# Patient Record
Sex: Female | Born: 1955 | Race: White | Hispanic: No | Marital: Married | Smoking: Former smoker
Health system: Southern US, Community
[De-identification: ages and names within clinical notes are randomized; demographics above are authoritative.]

## PROBLEM LIST (undated history)

## (undated) DIAGNOSIS — K7290 Hepatic failure, unspecified without coma: Secondary | ICD-10-CM

## (undated) DIAGNOSIS — K7682 Hepatic encephalopathy: Secondary | ICD-10-CM

## (undated) DIAGNOSIS — N289 Disorder of kidney and ureter, unspecified: Secondary | ICD-10-CM

## (undated) DIAGNOSIS — O020 Blighted ovum and nonhydatidiform mole: Secondary | ICD-10-CM

## (undated) DIAGNOSIS — E039 Hypothyroidism, unspecified: Secondary | ICD-10-CM

## (undated) DIAGNOSIS — E079 Disorder of thyroid, unspecified: Secondary | ICD-10-CM

## (undated) DIAGNOSIS — I10 Essential (primary) hypertension: Secondary | ICD-10-CM

## (undated) HISTORY — PX: DILATION AND CURETTAGE OF UTERUS: SHX78

## (undated) HISTORY — PX: THYROID SURGERY: SHX805

## (undated) HISTORY — PX: CHOLECYSTECTOMY: SHX55

## (undated) HISTORY — PX: BREAST BIOPSY: SHX20

## (undated) HISTORY — DX: Hypothyroidism, unspecified: E03.9

## (undated) HISTORY — DX: Hepatic failure, unspecified without coma: K72.90

## (undated) HISTORY — PX: HERNIA REPAIR: SHX51

## (undated) HISTORY — PX: HEMORRHOID SURGERY: SHX153

## (undated) HISTORY — DX: Blighted ovum and nonhydatidiform mole: O02.0

## (undated) HISTORY — DX: Hepatic encephalopathy: K76.82

## (undated) HISTORY — PX: TUBAL LIGATION: SHX77

## (undated) HISTORY — PX: APPENDECTOMY: SHX54

## (undated) HISTORY — PX: V-TACH ABLATION: SHX6186

## (undated) HISTORY — PX: BACK SURGERY: SHX140

## (undated) HISTORY — DX: Disorder of kidney and ureter, unspecified: N28.9

---

## 2000-10-27 ENCOUNTER — Encounter: Admission: RE | Admit: 2000-10-27 | Discharge: 2000-12-14 | Payer: Self-pay | Admitting: Internal Medicine

## 2001-11-12 ENCOUNTER — Other Ambulatory Visit: Admission: RE | Admit: 2001-11-12 | Discharge: 2001-11-12 | Payer: Self-pay | Admitting: Internal Medicine

## 2002-10-11 ENCOUNTER — Encounter: Payer: Self-pay | Admitting: Emergency Medicine

## 2002-10-11 ENCOUNTER — Emergency Department (HOSPITAL_COMMUNITY): Admission: EM | Admit: 2002-10-11 | Discharge: 2002-10-11 | Payer: Self-pay | Admitting: Emergency Medicine

## 2002-10-12 ENCOUNTER — Encounter: Payer: Self-pay | Admitting: Emergency Medicine

## 2002-10-12 ENCOUNTER — Emergency Department (HOSPITAL_COMMUNITY): Admission: RE | Admit: 2002-10-12 | Discharge: 2002-10-12 | Payer: Self-pay | Admitting: Emergency Medicine

## 2003-11-13 ENCOUNTER — Emergency Department (HOSPITAL_COMMUNITY): Admission: EM | Admit: 2003-11-13 | Discharge: 2003-11-13 | Payer: Self-pay | Admitting: Emergency Medicine

## 2004-06-19 ENCOUNTER — Inpatient Hospital Stay (HOSPITAL_COMMUNITY): Admission: RE | Admit: 2004-06-19 | Discharge: 2004-06-23 | Payer: Self-pay | Admitting: Neurosurgery

## 2004-07-03 ENCOUNTER — Encounter: Admission: RE | Admit: 2004-07-03 | Discharge: 2004-08-08 | Payer: Self-pay | Admitting: Neurosurgery

## 2004-07-25 ENCOUNTER — Encounter: Admission: RE | Admit: 2004-07-25 | Discharge: 2004-07-25 | Payer: Self-pay | Admitting: Neurosurgery

## 2004-08-20 ENCOUNTER — Encounter: Admission: RE | Admit: 2004-08-20 | Discharge: 2004-08-20 | Payer: Self-pay | Admitting: Neurosurgery

## 2004-11-19 ENCOUNTER — Encounter: Admission: RE | Admit: 2004-11-19 | Discharge: 2004-11-19 | Payer: Self-pay | Admitting: Neurosurgery

## 2009-01-26 ENCOUNTER — Emergency Department (HOSPITAL_COMMUNITY): Admission: EM | Admit: 2009-01-26 | Discharge: 2009-01-26 | Payer: Self-pay | Admitting: Emergency Medicine

## 2009-06-19 ENCOUNTER — Emergency Department (HOSPITAL_COMMUNITY): Admission: EM | Admit: 2009-06-19 | Discharge: 2009-06-19 | Payer: Self-pay | Admitting: Emergency Medicine

## 2010-08-18 LAB — POCT I-STAT, CHEM 8
BUN: 4 mg/dL — ABNORMAL LOW (ref 6–23)
Creatinine, Ser: <0.2 mg/dL — ABNORMAL LOW (ref 0.4–1.2)
Glucose, Bld: 278 mg/dL — ABNORMAL HIGH (ref 70–99)
Potassium: 3.9 mEq/L (ref 3.5–5.1)
Sodium: 134 mEq/L — ABNORMAL LOW (ref 135–145)

## 2010-08-18 LAB — DIFFERENTIAL
Basophils Relative: 1 % (ref 0–1)
Lymphs Abs: 1.4 10*3/uL (ref 0.7–4.0)
Monocytes Absolute: 0.2 10*3/uL (ref 0.1–1.0)
Monocytes Relative: 5 % (ref 3–12)
Neutro Abs: 3.3 10*3/uL (ref 1.7–7.7)
Neutrophils Relative %: 66 % (ref 43–77)

## 2010-08-18 LAB — CBC
Hemoglobin: 14 g/dL (ref 12.0–15.0)
MCHC: 34.8 g/dL (ref 30.0–36.0)
MCV: 98 fL (ref 78.0–100.0)
RBC: 4.11 MIL/uL (ref 3.87–5.11)
WBC: 5.1 10*3/uL (ref 4.0–10.5)

## 2010-08-18 LAB — URINALYSIS, ROUTINE W REFLEX MICROSCOPIC
Bilirubin Urine: NEGATIVE
Ketones, ur: NEGATIVE mg/dL
Nitrite: POSITIVE — AB
Protein, ur: NEGATIVE mg/dL

## 2010-08-18 LAB — URINE MICROSCOPIC-ADD ON

## 2010-08-18 LAB — POCT CARDIAC MARKERS
CKMB, poc: 2 ng/mL (ref 1.0–8.0)
Myoglobin, poc: 141 ng/mL (ref 12–200)
Troponin i, poc: <0.05 ng/mL (ref 0.00–0.09)

## 2010-08-18 LAB — POCT PREGNANCY, URINE: Preg Test, Ur: NEGATIVE

## 2010-09-07 LAB — DIFFERENTIAL
Basophils Relative: 0 % (ref 0–1)
Lymphocytes Relative: 17 % (ref 12–46)
Lymphs Abs: 1.3 10*3/uL (ref 0.7–4.0)
Monocytes Relative: 6 % (ref 3–12)
Neutro Abs: 5.8 10*3/uL (ref 1.7–7.7)
Neutrophils Relative %: 77 % (ref 43–77)

## 2010-09-07 LAB — BASIC METABOLIC PANEL
Calcium: 9.2 mg/dL (ref 8.4–10.5)
Creatinine, Ser: 0.8 mg/dL (ref 0.4–1.2)
GFR calc Af Amer: >60 mL/min (ref >60)
GFR calc non Af Amer: >60 mL/min (ref >60)
Sodium: 138 mEq/L (ref 135–145)

## 2010-09-07 LAB — CBC
Hemoglobin: 13.6 g/dL (ref 12.0–15.0)
MCHC: 33.9 g/dL (ref 30.0–36.0)
RBC: 3.99 MIL/uL (ref 3.87–5.11)
WBC: 7.5 10*3/uL (ref 4.0–10.5)

## 2010-10-18 NOTE — Op Note (Signed)
NAMETAYLEIGH, Traci Harper NO.:  [id number]   MEDICAL RECORD NO.:  [id number]          PATIENT TYPE:  INP   LOCATION:  2899                         FACILITY:  [facility]   PHYSICIAN:  Donalee Citrin, M.D.        DATE OF BIRTH:  08-17-1955   DATE OF PROCEDURE:  06/19/2004  DATE OF DISCHARGE:                                 OPERATIVE REPORT   PREOPERATIVE DIAGNOSIS:  Grade I spondylolisthesis L4-5 with severe lumbar  spinal stenosis L4-5 and lumbar instability L4-5.   PROCEDURE:  Decompressive lumbar laminectomy L4-5 in excess of what would  normally have to be done for a posterior lumbar interbody fusion.  Posterior  lumbar interbody fusion L4-5 using 12 x 26 mm Tangent allograft wedges.  Pedicle screw fixation L4-5 using the M10C Horizon Legacy pedicle screw  system with 6.5 x 45 pedicle screws.  Posterior arthrodesis L4-5.  Open  reduction of spinal deformity L4-5.  Placement of medium Hemovac drain L4-5.   ANESTHESIA:  General endotracheal.   SURGEON:  Donalee Citrin, M.D.   ASSISTANT:  Tia Alert, M.D.   COMPLICATIONS:  CSF leak.   INDICATIONS FOR PROCEDURE:  The patient is a very pleasant 55 year old  female who has longstanding back and bilateral leg pain with neurogenic  claudication over very short distances.  Preoperative imaging showed severe  lumbar spinal stenosis with a virtual complete CSF block by MRI criteria at  L4-5 and about 20% subluxation of L4-5 due to spondylolisthesis.  The  patient had failure of conservative treatment.  The patient was recommended  a decompressive and stabilization procedure.  I discussed all the risks and  benefits of surgery with her.  She understands and agrees to proceed  forward.   DESCRIPTION OF PROCEDURE:  The patient was brought to the operating room,  administered general anesthesia, positioned prone on the Wilson frame, and  the back was prepped and draped in the usual sterile fashion.  Preoperative  x-ray localized  the L4-5 disk space.  After infiltration of 10 mL of  lidocaine with epinephrine, a midline incision was made.  Bovie  electrocautery was used to take down the subcutaneous tissues and  subperiosteal dissection was carried out on the lamina of L3, 4, and 5  bilaterally.  The TP's of L4 and L5 were identified and confirmed by  fluoroscopy.  Then the spinous process of L4 was removed.  Prior to this,  the facet complex, especially on the left, was noted to be markedly  hypertrophic and overgrown on top of the lamina.  This was bitten off with a  Leksell rongeur and the spinous process of L4 was removed and laminectomy  was begun.  The central canal at the L4-5 interspace just to the superior  aspect of the L5 lamina was noted to be markedly stenotic and after the  virtual complete laminectomy had been completed at the L4 cephalad to this  and the L4 neuroforamen had been identified which were noted to be markedly  compressive from overgrowth of facet arthropathy as well as due to the  generous lip, the 3-4 facet was on top of the 4-5 facet compressing the roof  of the L4 nerve root, especially on the right side.  Both of these  neuroforamen were underbitten and opened up.  The complete medial  facetectomies were performed at L4-5.  The L5 nerve root was noted to be  markedly snug due to the compression of the L4-5 disk space and the  overriding lip of the L5 lamina.  The superior aspect of the L5 lamina had  to be removed to decompress this part.  During that central decompression, a  small pinhole rent was placed in the dura, right in the midline.  This was  packed away and the remainder of the laminectomy was completed around it,  decompressing this aspect of the thecal sac which was noted to be markedly  stenotic.  The ligament was noted to be markedly hypertrophied also and the  facet degenerated over top of it.  After all of this had been removed and  the L5 nerve roots were decompressed  at the neuroforamen, this hole was  investigated and it was very small.  It was felt not to be worth sewing due  to the fact that it would probably exacerbate the leak.  So this was packed  away and attention was taken to the interbody work.  The lateral facet  complexes were bitten laterally to expose the lateral interspace.  Epidural  vein was coagulated.  The disk space was incised on the right side with a  D'Errico reflecting the right L5 nerve root medially.  The interspace was  cleaned off.  A size 12 distractor was inserted with good apposition of the  endplates.  Two 12 grafts were then opened.  Then on the left side, the L5  nerve root was then reflected medially.  The disk space was cleaned out  using a size 12 cutter and scissors with fluoroscopy confirming each step  along the way.  The endplates were purchased to the bone graft.  Downgoing  Epstein curet was also used to confirm good preparation of the endplates  both centrally and at the level of bone graft placement.  Then the Tangent  allograft was inserted on the left side, and then on the right side this was  completed in a similar fashion.  Several large fragments of tissue removed  from the central compartment.  Disk was noted to be markedly degenerated and  after placement of the 12 distractor and the left-sided allograft, the  spondylolisthesis was reduced to about half its original slip.  Then the  size 12 cutter and scissors were used to prepare the right side of the  interspace.  Locally harvested autograft was packed against the left side  allograft and the right-sided allograft was inserted.  After both allografts  were inserted, the dural rent was investigated and it had already sealed  itself off.  So this was again just repacked and attention was taken to  pedicle screw placement.  Using a pilot hole and fluoroscopy guiding with  direct intracanalicular inspection of the borders of the medial pedicles as well  as several cephalocaudal borders, pilot holes were drilled.  They were  cannulated with the awl.  The pedicles were noted to be markedly sclerotic  and very difficult to cannulate.  However, this was performed.  The pedicle  was probed from within, tapped with a 5.5 tap, probed again, and a 6.5 x 45  pedicle screw inserted.  The pedicle was noted to be competent from within  the pedicle as well as within the canal and this procedure was repeated at  L5 on the left and L4-5 on the right.  After all four pedicle screws were  inserted, the wound was copiously irrigated.  Meticulous hemostasis was  maintained.  Aggressive decortication was carried out in the lateral gutters  and the TP's and lateral facet complexes.  The remainder of the locally  harvested autograft was packed in these lateral gutters. Then two 40 mm rods  were inserted.  __________ was tightened down at L5.  The L4 pedicle screws  were compressed against L5.  The bone grafts were then reinspected and the  foramen were repalpated and noted to be widely patent.  Then fluoroscopy  confirmed good position of the bone grafts, screws, and rods.  Then the 1 x  1 piece of Duragen which had been placed earlier, had fallen off.  Another  one had to be reapplied and then during drying, a third one had to be  reapplied as this was damaged as well.  But, Tisseel was also placed over  top of the Duragen and Gelfoam and this appeared to have a good sealing of  this pinhole in the dura.  Then after this was packed away and meticulous  hemostasis was maintained, the muscle and fascia were reapproximated with 0  interrupted Vicryl and the subcutaneous tissue closed with 2-0 interrupted  Vicryl and the skin closed with running 4-0 subcuticular.  Benzoin and Steri-  Strips applied.  The patient went to the recovery room in stable condition.  At the end of the case, needle, sponge, and instrument counts correct.       GC/MEDQ  D:  06/19/2004   T:  06/20/2004  Job:  [id number]

## 2011-04-24 ENCOUNTER — Emergency Department (INDEPENDENT_AMBULATORY_CARE_PROVIDER_SITE_OTHER): Payer: 59

## 2011-04-24 ENCOUNTER — Encounter: Payer: Self-pay | Admitting: *Deleted

## 2011-04-24 ENCOUNTER — Emergency Department (HOSPITAL_BASED_OUTPATIENT_CLINIC_OR_DEPARTMENT_OTHER)
Admission: EM | Admit: 2011-04-24 | Discharge: 2011-04-24 | Disposition: A | Discharge status: Other Acute Inpatient Hospital | Payer: 59 | Attending: Emergency Medicine | Admitting: Emergency Medicine

## 2011-04-24 ENCOUNTER — Other Ambulatory Visit: Payer: Self-pay

## 2011-04-24 DIAGNOSIS — R51 Headache: Secondary | ICD-10-CM | POA: Insufficient documentation

## 2011-04-24 DIAGNOSIS — I10 Essential (primary) hypertension: Secondary | ICD-10-CM | POA: Insufficient documentation

## 2011-04-24 DIAGNOSIS — E079 Disorder of thyroid, unspecified: Secondary | ICD-10-CM | POA: Insufficient documentation

## 2011-04-24 DIAGNOSIS — K759 Inflammatory liver disease, unspecified: Secondary | ICD-10-CM | POA: Insufficient documentation

## 2011-04-24 DIAGNOSIS — E119 Type 2 diabetes mellitus without complications: Secondary | ICD-10-CM | POA: Insufficient documentation

## 2011-04-24 DIAGNOSIS — I959 Hypotension, unspecified: Secondary | ICD-10-CM

## 2011-04-24 DIAGNOSIS — R110 Nausea: Secondary | ICD-10-CM

## 2011-04-24 DIAGNOSIS — Z79899 Other long term (current) drug therapy: Secondary | ICD-10-CM | POA: Insufficient documentation

## 2011-04-24 HISTORY — DX: Essential (primary) hypertension: I10

## 2011-04-24 HISTORY — DX: Disorder of thyroid, unspecified: E07.9

## 2011-04-24 LAB — TROPONIN I: Troponin I: <0.3 ng/mL (ref <0.30)

## 2011-04-24 LAB — CBC
HCT: 28 % — ABNORMAL LOW (ref 36.0–46.0)
Hemoglobin: 10.3 g/dL — ABNORMAL LOW (ref 12.0–15.0)
MCH: 37.9 pg — ABNORMAL HIGH (ref 26.0–34.0)
RBC: 2.72 MIL/uL — ABNORMAL LOW (ref 3.87–5.11)

## 2011-04-24 LAB — DIFFERENTIAL
Basophils Relative: 0 % (ref 0–1)
Eosinophils Relative: 1 % (ref 0–5)
Lymphocytes Relative: 38 % (ref 12–46)
Monocytes Absolute: 0.7 10*3/uL (ref 0.1–1.0)
Neutro Abs: 3 10*3/uL (ref 1.7–7.7)
Neutrophils Relative %: 50 % (ref 43–77)

## 2011-04-24 LAB — COMPREHENSIVE METABOLIC PANEL
ALT: 57 U/L — ABNORMAL HIGH (ref 0–35)
BUN: 15 mg/dL (ref 6–23)
CO2: 25 mEq/L (ref 19–32)
Calcium: 9 mg/dL (ref 8.4–10.5)
Creatinine, Ser: 3 mg/dL — ABNORMAL HIGH (ref 0.50–1.10)
GFR calc Af Amer: 19 mL/min — ABNORMAL LOW (ref >90)
GFR calc non Af Amer: 17 mL/min — ABNORMAL LOW (ref >90)
Glucose, Bld: 78 mg/dL (ref 70–99)

## 2011-04-24 LAB — URINALYSIS, ROUTINE W REFLEX MICROSCOPIC
Leukocytes, UA: NEGATIVE
Nitrite: NEGATIVE
Specific Gravity, Urine: 1.012 (ref 1.005–1.030)
Urobilinogen, UA: 2 mg/dL — ABNORMAL HIGH (ref 0.0–1.0)

## 2011-04-24 MED ORDER — POTASSIUM CHLORIDE CRYS ER 20 MEQ PO TBCR
60.0000 meq | EXTENDED_RELEASE_TABLET | Freq: Once | ORAL | Status: AC
  Administered 2011-04-24: 60 meq via ORAL
  Filled 2011-04-24: qty 3

## 2011-04-24 MED ORDER — VANCOMYCIN HCL IN DEXTROSE 1-5 GM/200ML-% IV SOLN
1000.0000 mg | Freq: Once | INTRAVENOUS | Status: AC
  Administered 2011-04-24: 1000 mg via INTRAVENOUS
  Filled 2011-04-24: qty 200

## 2011-04-24 MED ORDER — PIPERACILLIN-TAZOBACTAM 3.375 G IVPB
3.3750 g | Freq: Once | INTRAVENOUS | Status: DC
  Administered 2011-04-24: 3.375 g via INTRAVENOUS
  Filled 2011-04-24: qty 50

## 2011-04-24 MED ORDER — SODIUM CHLORIDE 0.9 % IV BOLUS (SEPSIS)
1000.0000 mL | Freq: Once | INTRAVENOUS | Status: AC
  Administered 2011-04-24: 1000 mL via INTRAVENOUS

## 2011-04-24 NOTE — ED Notes (Signed)
Up to BR via w/c  Tolerated well.  Husband remains at bedside.  Pt status unchanged

## 2011-04-24 NOTE — ED Notes (Signed)
Pt c/o "small" h/a and " low bp" x 1 day

## 2011-04-24 NOTE — ED Provider Notes (Addendum)
History     CSN: [id number] Arrival date & time: 04/24/2011  5:49 PM   First MD Initiated Contact with Patient 04/24/11 1755      Chief Complaint  Patient presents with  . Headache  . Hypotension    (Consider location/radiation/quality/duration/timing/severity/associated sxs/prior treatment) HPI Comments: Patient is a 55 year old female. She woke up this morning feeling her normal usual healthy self. As she was going out the door to visit family for Thanksgiving when she looked into the bright light she developed a sudden intense headache to the bifrontal area. She went inside to lay back down and covered her face with a cough and the headache went away. When she went back out and felt like the headache came back again. At this time she only has very mild throbbing t supraorbital area. She denies any recent headaches other than over the last month has had some similar mild throbbing to the supraorbital areas. She has been seen in her nose and throat doctor for possible sinus issues. She also has had some intermittent bleeding from her nose over the same time.. Today she had some slight dizziness and when she took her blood pressure she noticed that it was low. She is currently on Diovan she had her blood pressure checked about a week ago and was told that it was fine. She has lost a significant amount of weight over the last year and feels that maybe her Diovan his is too strong at this point. She has not noticed any increased fatigue. She denies any chest pain shortness of breath or abdominal pain. No recent nausea vomiting. No recent head injuries. Denies any neuro deficits. She does have a history of increased alcohol intake over the holidays and drink about 6 drinks yesterday. Denies any alcohol intake today.  She also has noted some small cuts recently had taken a long time to stop bleeding. She also says that she was previously on Amaryl for diabetes but since she's lost this weight her blood  sugars have been lower and she stopped taking diabetes medication.  Patient is a 55 y.o. female presenting with headaches.  Headache  This is a new problem. Pertinent negatives include no fever, no shortness of breath, no nausea and no vomiting.    Past Medical History  Diagnosis Date  . Thyroid disease   . Hypertension   . Diabetes mellitus     Past Surgical History  Procedure Date  . Tonsillectomy   . Appendectomy   . Back surgery   . Cesarean section   . Tubal ligation     History reviewed. No pertinent family history.  History  Substance Use Topics  . Smoking status: Former Games developer  . Smokeless tobacco: Not on file  . Alcohol Use: No    OB History    Grav Para Term Preterm Abortions TAB SAB Ect Mult Living                  Review of Systems  Constitutional: Negative for fever, chills, diaphoresis, activity change, appetite change and fatigue.  HENT: Positive for nosebleeds and congestion. Negative for rhinorrhea and sneezing.   Eyes: Negative.   Respiratory: Negative for cough, chest tightness and shortness of breath.   Cardiovascular: Negative for chest pain and leg swelling.  Gastrointestinal: Negative for nausea, vomiting, abdominal pain, diarrhea and blood in stool.  Genitourinary: Negative for frequency, hematuria, flank pain and difficulty urinating.  Musculoskeletal: Negative for back pain and arthralgias.  Skin: Negative for  rash.  Neurological: Positive for dizziness and headaches. Negative for tremors, seizures, syncope, speech difficulty, weakness and numbness.  Hematological: Negative for adenopathy. Bruises/bleeds easily.    Allergies  Review of patient's allergies indicates no known allergies.  Home Medications   Current Outpatient Rx  Name Route Sig Dispense Refill  . ASPIRIN-ACETAMINOPHEN-CAFFEINE 250-250-65 MG PO TABS Oral Take 2 tablets by mouth once as needed. For headache      . DOCUSATE SODIUM 100 MG PO CAPS Oral Take 100 mg by  mouth daily.      Marland Kitchen GLIMEPIRIDE 2 MG PO TABS Oral Take 2 mg by mouth daily.      . IBUPROFEN 200 MG PO TABS Oral Take 400 mg by mouth once as needed. For headache     . SYNTHROID PO Oral Take 2 tablets by mouth daily.      Marland Kitchen METOPROLOL SUCCINATE 100 MG PO TB24 Oral Take 100 mg by mouth 2 (two) times daily.      Marland Kitchen OMEPRAZOLE MAGNESIUM 20 MG PO TBEC Oral Take 20 mg by mouth daily.      Marland Kitchen PHENYLEPHRINE HCL 10 MG PO TABS Oral Take 10 mg by mouth every 4 (four) hours as needed. For headache     . DIOVAN PO Oral Take 1 tablet by mouth daily.        BP 92/40  Pulse 72  Temp(Src) 98.1 F (36.7 C) (Oral)  Resp 16  Ht 5\' 2"  (1.575 m)  Wt 215 lb (97.523 kg)  BMI 39.32 kg/m2  SpO2 100%  Physical Exam  Constitutional: She is oriented to person, place, and time. She appears well-developed and well-nourished.  HENT:  Head: Normocephalic and atraumatic.  Eyes: EOM are normal. Pupils are equal, round, and reactive to light.       Positive icterus present  Neck: Normal range of motion. Neck supple.  Cardiovascular: Normal rate, regular rhythm and normal heart sounds.   Pulmonary/Chest: Effort normal and breath sounds normal. No respiratory distress. She has no wheezes. She has no rales. She exhibits no tenderness.  Abdominal: Soft. Bowel sounds are normal. There is no tenderness. There is no rebound and no guarding.  Genitourinary:       Rectal no gross blood  Musculoskeletal: Normal range of motion. She exhibits no edema.  Lymphadenopathy:    She has no cervical adenopathy.  Neurological: She is alert and oriented to person, place, and time.  Skin: Skin is warm and dry. No rash noted.  Psychiatric: She has a normal mood and affect.    ED Course  Procedures (including critical care time)  Results for orders placed during the hospital encounter of 04/24/11  CBC      Component Value Range   WBC 6.2  4.0 - 10.5 (K/uL)   RBC 2.72 (*) 3.87 - 5.11 (MIL/uL)   Hemoglobin 10.3 (*) 12.0 - 15.0  (g/dL)   HCT 16.1 (*) 09.6 - 46.0 (%)   MCV 102.9 (*) 78.0 - 100.0 (fL)   MCH 37.9 (*) 26.0 - 34.0 (pg)   MCHC 36.8 (*) 30.0 - 36.0 (g/dL)   RDW 04.5  40.9 - 81.1 (%)   Platelets 82 (*) 150 - 400 (K/uL)  DIFFERENTIAL      Component Value Range   Neutrophils Relative 50  43 - 77 (%)   Lymphocytes Relative 38  12 - 46 (%)   Monocytes Relative 11  3 - 12 (%)   Eosinophils Relative 1  0 - 5 (%)  Basophils Relative 0  0 - 1 (%)   Neutro Abs 3.0  1.7 - 7.7 (K/uL)   Lymphs Abs 2.4  0.7 - 4.0 (K/uL)   Monocytes Absolute 0.7  0.1 - 1.0 (K/uL)   Eosinophils Absolute 0.1  0.0 - 0.7 (K/uL)   Basophils Absolute 0.0  0.0 - 0.1 (K/uL)   RBC Morphology TARGET CELLS     Smear Review LARGE PLATELETS PRESENT    COMPREHENSIVE METABOLIC PANEL      Component Value Range   Sodium 134 (*) 135 - 145 (mEq/L)   Potassium 2.7 (*) 3.5 - 5.1 (mEq/L)   Chloride 94 (*) 96 - 112 (mEq/L)   CO2 25  19 - 32 (mEq/L)   Glucose, Bld 78  70 - 99 (mg/dL)   BUN 15  6 - 23 (mg/dL)   Creatinine, Ser 8.46 (*) 0.50 - 1.10 (mg/dL)   Calcium 9.0  8.4 - 96.2 (mg/dL)   Total Protein 7.1  6.0 - 8.3 (g/dL)   Albumin 2.9 (*) 3.5 - 5.2 (g/dL)   AST 952 (*) 0 - 37 (U/L)   ALT 57 (*) 0 - 35 (U/L)   Alkaline Phosphatase 139 (*) 39 - 117 (U/L)   Total Bilirubin 7.3 (*) 0.3 - 1.2 (mg/dL)   GFR calc non Af Amer 17 (*) >90 (mL/min)   GFR calc Af Amer 19 (*) >90 (mL/min)  URINALYSIS, ROUTINE W REFLEX MICROSCOPIC      Component Value Range   Color, Urine AMBER (*) YELLOW    Appearance CLOUDY (*) CLEAR    Specific Gravity, Urine 1.012  1.005 - 1.030    pH 5.5  5.0 - 8.0    Glucose, UA NEGATIVE  NEGATIVE (mg/dL)   Hgb urine dipstick NEGATIVE  NEGATIVE    Bilirubin Urine MODERATE (*) NEGATIVE    Ketones, ur 15 (*) NEGATIVE (mg/dL)   Protein, ur NEGATIVE  NEGATIVE (mg/dL)   Urobilinogen, UA 2.0 (*) 0.0 - 1.0 (mg/dL)   Nitrite NEGATIVE  NEGATIVE    Leukocytes, UA NEGATIVE  NEGATIVE   LACTIC ACID, PLASMA      Component Value  Range   Lactic Acid, Venous 2.8 (*) 0.5 - 2.2 (mmol/L)  OCCULT BLOOD X 1 CARD TO LAB, STOOL      Component Value Range   Fecal Occult Bld POSITIVE    TROPONIN I      Component Value Range   Troponin I <0.30  <0.30 (ng/mL)  APTT      Component Value Range   aPTT 49 (*) 24 - 37 (seconds)  PROTIME-INR      Component Value Range   Prothrombin Time 18.3 (*) 11.6 - 15.2 (seconds)   INR 1.49  0.00 - 1.49    Dg Chest 2 View  04/24/2011  *RADIOLOGY REPORT*  Clinical Data: Headache.  Hypotension.  CHEST - 2 VIEW 04/24/2011:  Comparison: Portable chest x-ray 06/19/2009 and two-view chest x- ray 06/14/2004 [facility].  Findings: Cardiomediastinal silhouette unremarkable and unchanged. Lungs clear.  Bronchovascular markings normal.  No pleural effusions.  Mild degenerative changes involving the thoracic spine. No significant interval change.  IMPRESSION: No acute cardiopulmonary disease.  Stable examination.  Original Report Authenticated By: Arnell Sieving, M.D.   Ct Head Wo Contrast  04/24/2011  *RADIOLOGY REPORT*  Clinical Data: 55 year old female with headache, hypertension, nausea.  CT HEAD WITHOUT CONTRAST  Technique:  Contiguous axial images were obtained from the base of the skull through the vertex without contrast.  Comparison: None.  Findings: Visualized paranasal sinuses and mastoids are clear. Visualized orbit soft tissues are within normal limits.  Visualized scalp soft tissues are within normal limits.  No acute osseous abnormality identified.  Cerebral volume is within normal limits for age.  No midline shift, ventriculomegaly, mass effect, evidence of mass lesion, intracranial hemorrhage or evidence of cortically based acute infarction.  Gray-white matter differentiation is within normal limits throughout the brain.  No suspicious intracranial vascular hyperdensity.  IMPRESSION: Normal noncontrast CT appearance of the brain.  Original Report Authenticated By: Harley Hallmark,  M.D.     Date: 04/24/2011  Rate: 80  Rhythm: normal sinus rhythm  QRS Axis: normal  Intervals: normal  ST/T Wave abnormalities: normal  Conduction Disutrbances:none  Narrative Interpretation:   Old EKG Reviewed: none available, found old, unchanged from prior     1. Hepatitis   2. Hypotension     I rechecked BP in room and was 84 systolic.  Pt getting IV fluid bolus.  Checking labs, CT head  MDM  Tumor vs hematologic disorder vs infection vs medication reaction   PT with low platelets, has been low in past, but lower now, jaundice with elevated liver enzymes.  Sent hepatic panel.  Is still hypotensive with BP 63, layed pt down, BP went up to 96.  Giving 2nd liter of fluids.  Pt in no distress.  No fever or evidence of sepsis.  May be related to hepatitis.  No pain to abdomen to suggest cholecystitis or cholangitis.  Will consult hospitalist for admission.  Pt requests [facility].  Reviewed labs, hgg 14 one year ago.  Platelets 117   Discussed with hospitalist at [facility], Dr. Heron Nay who has accepted pt for transfer.  CRITICAL CARE Performed by: Theoren Palka   Total critical care time: 80  Critical care time was exclusive of separately billable procedures and treating other patients.  Critical care was necessary to treat or prevent imminent or life-threatening deterioration.  Critical care was time spent personally by me on the following activities: development of treatment plan with patient and/or surrogate as well as nursing, discussions with consultants, evaluation of patient's response to treatment, examination of patient, obtaining history from patient or surrogate, ordering and performing treatments and interventions, ordering and review of laboratory studies, ordering and review of radiographic studies, pulse oximetry and re-evaluation of patient's condition.   Rolan Bucco, MD 04/24/11 1610  Rolan Bucco, MD 04/24/11 [id number]  Rolan Bucco, MD 04/24/11 2307

## 2011-04-24 NOTE — ED Notes (Signed)
Dr. Fredderick Phenix notified of critical K+ of 2.7 per lab tech.

## 2011-04-24 NOTE — ED Notes (Signed)
Has been up to BR via w/c x 3 and tolerated well.to CT and back  And tolerated well.  Remains alert and oriented, face flushed and lips dry.  Husband remains at bedside .

## 2011-04-24 NOTE — ED Notes (Signed)
Report given to RN at [facility] [place])

## 2011-04-25 LAB — URINE CULTURE
Colony Count: NO GROWTH
Culture  Setup Time: 201211230030

## 2011-04-25 LAB — HEPATITIS PANEL, ACUTE: HCV Ab: NEGATIVE

## 2011-05-01 LAB — CULTURE, BLOOD (ROUTINE X 2)
Culture  Setup Time: 201211231410
Culture: NO GROWTH

## 2012-07-02 ENCOUNTER — Ambulatory Visit: Payer: 59 | Attending: Gynecology | Admitting: Gynecology

## 2012-07-02 ENCOUNTER — Encounter: Payer: Self-pay | Admitting: Gynecology

## 2012-07-02 VITALS — BP 110/50 | HR 98 | Temp 98.0°F | Ht 63.0 in | Wt 262.5 lb

## 2012-07-02 DIAGNOSIS — R1900 Intra-abdominal and pelvic swelling, mass and lump, unspecified site: Secondary | ICD-10-CM

## 2012-07-02 DIAGNOSIS — N83209 Unspecified ovarian cyst, unspecified side: Secondary | ICD-10-CM

## 2012-07-02 DIAGNOSIS — R1909 Other intra-abdominal and pelvic swelling, mass and lump: Secondary | ICD-10-CM | POA: Insufficient documentation

## 2012-07-02 DIAGNOSIS — K7031 Alcoholic cirrhosis of liver with ascites: Secondary | ICD-10-CM

## 2012-07-02 DIAGNOSIS — K7290 Hepatic failure, unspecified without coma: Secondary | ICD-10-CM

## 2012-07-02 NOTE — Patient Instructions (Signed)
We will schedule a return appointment for 6 months to have an ultrasound obtained.

## 2012-07-02 NOTE — Progress Notes (Signed)
Consult Note: Gyn-Onc   Traci Harper 57 y.o. female  Chief Complaint  Patient presents with  . Pelvic Mass    New patient      HPI: 57 year old white female seen in consultation at the request of the internal medicine service at [facility] hospitals regarding management of ovarian cysts. 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 In the course of a workup she was found to have massive ascites and on ultrasound of the pelvis was found to have a multicystic right ovarian mass with thin septations. The largest mass measured 3.5 cm. A paracentesis was performed and no malignant cells were identified. A CA 125 was also obtained which was in excess of 700 units per mL. The patient has a past history of ovarian cysts initially recognized by the her prior gynecologist Dr. Arnette Schaumann in [place]. I have contacted Dr. Cliffton Asters tells me the cyst in 2008 measured 3 x 2 cm with some small excrescences.  The patient reports no other gynecologic history except for the fact that she had a hydatidiform mole treated by [facility] and what sounds like intramuscular methotrexate at [facility] many years ago. She subsequently had 2 children.  Review of Systems:10 point review of systems is negative as noted above.   Vitals: Blood pressure 110/50, pulse 98, temperature 98 F (36.7 C), temperature source Oral, height 5\' 3"  (1.6 m), weight 262 lb 8 oz (119.069 kg).  Physical Exam: General : The patient is a healthy woman in no acute distress.  HEENT: normocephalic, extraoccular movements normal; neck is supple without thyromegally  Lynphnodes: Supraclavicular and inguinal nodes not enlarged  Abdomen:  Massively distended with ascites. There are no palpable masses or palpable organomegaly. Pelvic:  EGBUS: Normal female  Vagina: Normal, no lesions  Urethra and Bladder: Normal, non-tender  Cervix: Normal Uterus: Difficult to outline due to the patient's abdominal distention and  obesity. The cervix is mobile. I do not feel any masses or nodularity  Bi-manual examination: Non-tender; no adenxal masses or nodularity  Rectal: normal sphincter tone, no masses, no blood  Lower extremities: Moderate edema of both legs or varicosities. Normal range of motion    Assessment/Plan: Ovarian cyst with thin septations. These appeared to be benign and I would recommend we repeated an ultrasound in 6 months. 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  She return in 6 months to have a repeat ultrasound.  No Known Allergies  Past Medical History  Diagnosis Date  . Thyroid disease   . Hypertension   . Diabetes mellitus   . Hepatic encephalopathy   . Kidney insufficiency   . Hypothyroidism   . Molar pregnancy     at age 26    Past Surgical History  Procedure Date  . Hemorrhoid surgery   . Appendectomy   . Back surgery   . Cesarean section     x2  . Tubal ligation   . Thyroid surgery     x2  . Dilation and curettage of uterus     at 18  . V-tach ablation     Current Outpatient Prescriptions  Medication Sig Dispense Refill  . ciprofloxacin (CIPRO) 750 MG tablet Take 750 mg by mouth once a week.      . folic acid (FOLVITE) 1 MG tablet Take 1 mg by mouth daily.      . hydrOXYzine (ATARAX/VISTARIL) 25 MG tablet Take 25  mg by mouth 2 (two) times daily as needed.      . Lactulose 20 GM/30ML SOLN Take 20 mLs by mouth 3 (three) times daily. TID daily with goal of 3-5 bowel movements a day      . Levothyroxine Sodium (SYNTHROID PO) Take 150 mcg by mouth daily.       . midodrine (PROAMATINE) 10 MG tablet Take 10 mg by mouth 3 (three) times daily.      Marland Kitchen octreotide (SANDOSTATIN) 100 MCG/ML SOLN Inject 100 mcg into the skin 3 (three) times daily.      . rifaximin (XIFAXAN) 550 MG TABS Take 550 mg by mouth 2 (two) times daily.      Marland Kitchen senna-docusate (SENOKOT-S) 8.6-50 MG per tablet Take 1 tablet by mouth daily.      Marland Kitchen thiamine  100 MG tablet Take 100 mg by mouth daily.      . vitamin B-12 (CYANOCOBALAMIN) 100 MCG tablet Take 100 mcg by mouth daily.      Marland Kitchen aspirin-acetaminophen-caffeine (EXCEDRIN MIGRAINE) 250-250-65 MG per tablet Take 2 tablets by mouth once as needed. For headache        . docusate sodium (COLACE) 100 MG capsule Take 100 mg by mouth daily.        . phenylephrine (SUDAFED PE) 10 MG TABS Take 10 mg by mouth every 4 (four) hours as needed. For headache         History   Social History  . Marital Status: Single    Spouse Name: N/A    Number of Children: N/A  . Years of Education: N/A   Occupational History  . Not on file.   Social History Main Topics  . Smoking status: Former Smoker    Quit date: 06/02/2002  . Smokeless tobacco: Not on file  . Alcohol Use: No     Comment: not in over a year  . Drug Use: No  . Sexually Active: No   Other Topics Concern  . Not on file   Social History Narrative  . No narrative on file    Family History  Problem Relation Age of Onset  . Diabetes Mother   . Heart disease Father   . Diabetes Father       Jeannette Corpus, MD 07/02/2012, 12:34 PM

## 2012-09-17 HISTORY — PX: LIVER TRANSPLANT: SHX410

## 2012-12-24 ENCOUNTER — Encounter: Payer: Self-pay | Admitting: Gynecology

## 2012-12-24 ENCOUNTER — Ambulatory Visit: Payer: 59 | Attending: Gynecology | Admitting: Gynecology

## 2012-12-24 VITALS — BP 110/68 | HR 84 | Temp 98.0°F | Resp 16 | Ht 63.0 in | Wt 169.3 lb

## 2012-12-24 DIAGNOSIS — E119 Type 2 diabetes mellitus without complications: Secondary | ICD-10-CM | POA: Insufficient documentation

## 2012-12-24 DIAGNOSIS — I10 Essential (primary) hypertension: Secondary | ICD-10-CM | POA: Insufficient documentation

## 2012-12-24 DIAGNOSIS — R971 Elevated cancer antigen 125 [CA 125]: Secondary | ICD-10-CM | POA: Insufficient documentation

## 2012-12-24 DIAGNOSIS — R188 Other ascites: Secondary | ICD-10-CM | POA: Insufficient documentation

## 2012-12-24 DIAGNOSIS — E039 Hypothyroidism, unspecified: Secondary | ICD-10-CM | POA: Insufficient documentation

## 2012-12-24 DIAGNOSIS — Z944 Liver transplant status: Secondary | ICD-10-CM | POA: Insufficient documentation

## 2012-12-24 DIAGNOSIS — Z79899 Other long term (current) drug therapy: Secondary | ICD-10-CM | POA: Insufficient documentation

## 2012-12-24 DIAGNOSIS — N83209 Unspecified ovarian cyst, unspecified side: Secondary | ICD-10-CM

## 2012-12-24 NOTE — Patient Instructions (Signed)
We will repeat an ultrasound in the near future and then we'll repeat another ultrasound in 3-4 months LOC thereafter. Please contact me if you have any increasing pain. Your given a prescription for oxycodone 10 mg every 6 hours when necessary pain.

## 2012-12-24 NOTE — Progress Notes (Signed)
Consult Note: Gyn-Onc   Traci Harper 57 y.o. female  Chief Complaint  Patient presents with  . Ovarian Cyst    Follow up     Assessment: Ovarian cyst which is increased slightly since last examination. Patient is having mild lower nominal pain which may be secondary to this. At this juncture, given her recent liver transplant, I would like to defer any surgical intervention.  Plan: We'll obtain baseline pelvic ultrasound in the near future. She is given a prescription for oxycodone 10 mg every 6 hours when necessary pain  She return to see me in 3-4 months and will have a repeat ultrasound at that time as well for reassessment.  I contacted the patient's liver transplant physician Dr. Leighton Roach at [facility] [phone] and he concurs that deferring any pelvic surgery for long as possible would be advisable. Interval history: Since I last saw the patient she is undergone a liver transplant at [facility]. Apparently all went very smoothly and the patient is discharged after one week. 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 This was managed by embolectomy.  The patient reports that her ascites is much better and her renal function is much improved. She is now being followed by the transplant group at [facility].  She had a repeat abdominal CT scan on 12/17/2012. Pertinent findings included continued ascites and a left ovarian cystic mass with septation and possible nodule measuring 6.8 cm. This is considered to be slightly larger than previously. The patient reports that she has some intermittent lower nominal pain especially on the right.  HPI: 57 year old white female seen in consultation at the request of the internal medicine service at [facility] regarding management of ovarian cysts. 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 In the course of a workup she was found to have massive ascites and on ultrasound of the pelvis was found to  have a multicystic right ovarian mass with thin septations. The largest mass measured 3.5 cm. A paracentesis was performed and no malignant cells were identified. A CA 125 was also obtained which was in excess of 700 units per mL. The patient has a past history of ovarian cysts initially recognized by the her prior gynecologist Dr. Arnette Schaumann in [place]. I have contacted Dr. Cliffton Asters tells me the cyst in 2008 measured 3 x 2 cm with some small excrescences.  The patient reports no other gynecologic history except for the fact that she had a hydatidiform mole treated by [facility] and what sounds like intramuscular methotrexate at [facility] many years ago. She subsequently had 2 children.  Review of Systems:10 point review of systems is negative as noted above.   Vitals: Blood pressure 110/68, pulse 84, temperature 98 F (36.7 C), temperature source Oral, resp. rate 16, height 5\' 3"  (1.6 m), weight 169 lb 4.8 oz (76.794 kg).  Physical Exam: General : The patient is a healthy woman in no acute distress.  HEENT: normocephalic, extraoccular movements normal; neck is supple without thyromegally  Lynphnodes: Supraclavicular and inguinal nodes not enlarged  Abdomen:  Massively distended with ascites. There are no palpable masses or palpable organomegaly. Pelvic:  EGBUS: Normal female  Vagina: Normal, no lesions  Urethra and Bladder: Normal, non-tender  Cervix: Normal Uterus: Difficult to outline due to the patient's abdominal distention and obesity. The cervix is mobile. I do not feel any masses or nodularity  Bi-manual examination: Non-tender; no adenxal masses or nodularity  Rectal: normal sphincter tone, no masses, no blood  Lower extremities: Moderate edema of both legs or varicosities. Normal range of motion    Assessment/Plan: Ovarian cyst with thin septations. These appeared to be benign and I would recommend we repeated an ultrasound in 6 months. The ascites is most likely  secondary to her liver failure as is the elevated CA 125 value.  She return in 6 months to have a repeat ultrasound.  No Known Allergies  Past Medical History  Diagnosis Date  . Thyroid disease   . Hypertension   . Diabetes mellitus   . Hepatic encephalopathy   . Kidney insufficiency   . Hypothyroidism   . Molar pregnancy     at age 38    Past Surgical History  Procedure Laterality Date  . Hemorrhoid surgery    . Appendectomy    . Back surgery    . Cesarean section      x2  . Tubal ligation    . Thyroid surgery      x2  . Dilation and curettage of uterus      at 18  . V-tach ablation    . Liver transplant  09/17/2012    [email or web address]    Current Outpatient Prescriptions  Medication Sig Dispense Refill  . docusate sodium (COLACE) 100 MG capsule Take 100 mg by mouth daily.        Marland Kitchen Epoetin Alfa (EPOGEN IJ) Inject 20,000 Units as directed.      . folic acid (FOLVITE) 1 MG tablet Take 1 mg by mouth daily.      . Levothyroxine Sodium (SYNTHROID PO) Take 150 mcg by mouth daily.       . Multiple Vitamins-Minerals (MULTIVITAMIN PO) Take 1 tablet by mouth.      . mycophenolate (MYFORTIC) 360 MG TBEC Take 360 mg by mouth 2 (two) times daily.      Marland Kitchen senna-docusate (SENOKOT-S) 8.6-50 MG per tablet Take 1 tablet by mouth daily.      Marland Kitchen aspirin-acetaminophen-caffeine (EXCEDRIN MIGRAINE) 250-250-65 MG per tablet Take 2 tablets by mouth once as needed. For headache        . ciprofloxacin (CIPRO) 750 MG tablet Take 750 mg by mouth once a week.      . hydrOXYzine (ATARAX/VISTARIL) 25 MG tablet Take 25 mg by mouth 2 (two) times daily as needed.      . Lactulose 20 GM/30ML SOLN Take 20 mLs by mouth 3 (three) times daily. TID daily with goal of 3-5 bowel movements a day      . midodrine (PROAMATINE) 10 MG tablet Take 10 mg by mouth 3 (three) times daily.      Marland Kitchen octreotide (SANDOSTATIN) 100 MCG/ML SOLN Inject 100 mcg into the skin 3 (three) times daily.      . phenylephrine  (SUDAFED PE) 10 MG TABS Take 10 mg by mouth every 4 (four) hours as needed. For headache       . rifaximin (XIFAXAN) 550 MG TABS Take 550 mg by mouth 2 (two) times daily.      Marland Kitchen thiamine 100 MG tablet Take 100 mg by mouth daily.      . vitamin B-12 (CYANOCOBALAMIN) 100 MCG tablet Take 100 mcg by mouth daily.       No current facility-administered medications for this visit.    History   Social History  . Marital Status: Single    Spouse Name: N/A    Number of Children: N/A  . Years  of Education: N/A   Occupational History  . Not on file.   Social History Main Topics  . Smoking status: Former Smoker    Quit date: 06/02/2002  . Smokeless tobacco: Not on file  . Alcohol Use: No     Comment: not in over a year  . Drug Use: No  . Sexually Active: No   Other Topics Concern  . Not on file   Social History Narrative  . No narrative on file    Family History  Problem Relation Age of Onset  . Diabetes Mother   . Heart disease Father   . Diabetes Father       Jeannette Corpus, MD 12/24/2012, 10:49 AM

## 2012-12-28 ENCOUNTER — Other Ambulatory Visit: Payer: Self-pay | Admitting: Gynecologic Oncology

## 2012-12-28 DIAGNOSIS — N83209 Unspecified ovarian cyst, unspecified side: Secondary | ICD-10-CM

## 2012-12-30 ENCOUNTER — Telehealth: Payer: Self-pay | Admitting: *Deleted

## 2012-12-30 NOTE — Telephone Encounter (Signed)
Call patient to give u/s appointment. Pt is scheduled  To have u/s on Oct 20th @ 9am. Traci Harper was told to arrive at 8:45 @ [facility] with a full bladder.

## 2013-03-21 ENCOUNTER — Ambulatory Visit (HOSPITAL_COMMUNITY): Admission: RE | Admit: 2013-03-21 | Payer: 59 | Source: Ambulatory Visit

## 2013-03-31 ENCOUNTER — Ambulatory Visit (HOSPITAL_COMMUNITY)
Admission: RE | Admit: 2013-03-31 | Discharge: 2013-03-31 | Disposition: A | Discharge status: Home / Self Care | Payer: 59 | Source: Ambulatory Visit | Attending: Gynecologic Oncology | Admitting: Gynecologic Oncology

## 2013-03-31 DIAGNOSIS — Z944 Liver transplant status: Secondary | ICD-10-CM | POA: Insufficient documentation

## 2013-03-31 DIAGNOSIS — N83209 Unspecified ovarian cyst, unspecified side: Secondary | ICD-10-CM | POA: Insufficient documentation

## 2013-03-31 DIAGNOSIS — R188 Other ascites: Secondary | ICD-10-CM | POA: Insufficient documentation

## 2013-04-01 ENCOUNTER — Encounter: Payer: Self-pay | Admitting: Gynecology

## 2013-04-01 ENCOUNTER — Ambulatory Visit: Payer: 59 | Attending: Gynecology | Admitting: Gynecology

## 2013-04-01 VITALS — BP 123/82 | HR 75 | Temp 97.8°F | Resp 16 | Ht 63.0 in | Wt 169.2 lb

## 2013-04-01 DIAGNOSIS — N83209 Unspecified ovarian cyst, unspecified side: Secondary | ICD-10-CM | POA: Insufficient documentation

## 2013-04-01 DIAGNOSIS — Z79899 Other long term (current) drug therapy: Secondary | ICD-10-CM | POA: Insufficient documentation

## 2013-04-01 DIAGNOSIS — E119 Type 2 diabetes mellitus without complications: Secondary | ICD-10-CM | POA: Insufficient documentation

## 2013-04-01 DIAGNOSIS — I10 Essential (primary) hypertension: Secondary | ICD-10-CM | POA: Insufficient documentation

## 2013-04-01 DIAGNOSIS — Z87891 Personal history of nicotine dependence: Secondary | ICD-10-CM | POA: Insufficient documentation

## 2013-04-01 DIAGNOSIS — Z944 Liver transplant status: Secondary | ICD-10-CM | POA: Insufficient documentation

## 2013-04-01 DIAGNOSIS — E039 Hypothyroidism, unspecified: Secondary | ICD-10-CM | POA: Insufficient documentation

## 2013-04-01 DIAGNOSIS — Z7982 Long term (current) use of aspirin: Secondary | ICD-10-CM | POA: Insufficient documentation

## 2013-04-01 NOTE — Progress Notes (Signed)
Consult Note: Gyn-Onc   Traci Harper 57 y.o. female  Chief Complaint  Patient presents with  . Ovarian Cyst    Follow up visit      Assessment: Ovarian cyst cysts which are stable based on ultrasound of 03/31/2013 when compared to the CT scan of 12/17/2012. The patient is asymptomatic which is increased slightly since last examination. Patient is having mild lower nominal pain which may be secondary to this. At this juncture, given her recent liver transplant, I would like to defer any surgical intervention.  Plan: We will plan a repeat ultrasound to monitor the cysts in approximately 3-4 months and I will see the patient thereafter.  Interval history. The patient returns today as previously scheduled for followup of bilateral ovarian cysts. She had a repeat ultrasound on 03/31/2013 which was compared with a CT scan of 12/17/2012. The right ovarian cyst has been septations and measures 4.7 x 3.2 x 3.6 cm. The left ovary measures 6.3 x 4.9 x 6.7 cm. She reports that she's not having any pelvic symptoms and specifically denies any pain or pressure. She's not having any bleeding. Overall her functional state is improved from last visit.    HPI: 57 year old white female seen in consultation at the request of the internal medicine service at [facility] regarding management of ovarian cysts. 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 In the course of a workup she was found to have massive ascites and on ultrasound of the pelvis was found to have a multicystic right ovarian mass with thin septations. The largest mass measured 3.5 cm. A paracentesis was performed and no malignant cells were identified. A CA 125 was also obtained which was in excess of 700 units per mL. The patient has a past history of ovarian cysts initially recognized by the her prior gynecologist Dr. Arnette Schaumann in [place]. I have contacted Dr. Cliffton Asters tells me the cyst in 2008 measured 3 x 2 cm with some  small excrescences.  The patient reports no other gynecologic history except for the fact that she had a hydatidiform mole treated by [facility] and what sounds like intramuscular methotrexate at [facility] many years ago. She subsequently had 2 children.  Review of Systems:10 point review of systems is negative as noted above.   Vitals: Blood pressure 123/82, pulse 75, temperature 97.8 F (36.6 C), temperature source Oral, resp. rate 16, height 5\' 3"  (1.6 m), weight 169 lb 3.2 oz (76.749 kg).  Physical Exam: General : The patient is a healthy woman in no acute distress.  HEENT: normocephalic, extraoccular movements normal; neck is supple without thyromegally  Lynphnodes: Supraclavicular and inguinal nodes not enlarged  Abdomen:  Minimal ascites. There are no palpable masses or palpable organomegaly. Pelvic:  EGBUS: Normal female  Vagina: Normal, no lesions  Urethra and Bladder: Normal, non-tender  Cervix: Normal Uterus: Normal shape size and consistency. I do not feel any masses or nodularity  Bi-manual examination: Non-tender; no adenxal masses or nodularity  Rectal: normal sphincter tone, no masses, no blood  Lower extremities: Moderate edema of both legs or varicosities. Normal range of motion       No Known Allergies  Past Medical History  Diagnosis Date  . Thyroid disease   . Hypertension   . Diabetes mellitus   . Hepatic encephalopathy   . Kidney insufficiency   . Hypothyroidism   . Molar pregnancy     at age 38    Past  Surgical History  Procedure Laterality Date  . Hemorrhoid surgery    . Appendectomy    . Back surgery    . Cesarean section      x2  . Tubal ligation    . Thyroid surgery      x2  . Dilation and curettage of uterus      at 18  . V-tach ablation    . Liver transplant  09/17/2012    [email or web address]    Current Outpatient Prescriptions  Medication Sig Dispense Refill  . docusate sodium (COLACE) 100 MG capsule Take 100  mg by mouth daily.        . hydrOXYzine (ATARAX/VISTARIL) 25 MG tablet Take 25 mg by mouth 2 (two) times daily as needed.      . Levothyroxine Sodium (SYNTHROID PO) Take 125 mcg by mouth daily.       . Multiple Vitamins-Minerals (MULTIVITAMIN PO) Take 1 tablet by mouth.      . phenylephrine (SUDAFED PE) 10 MG TABS Take 10 mg by mouth every 4 (four) hours as needed. For headache       . senna-docusate (SENOKOT-S) 8.6-50 MG per tablet Take 1 tablet by mouth daily.      . tacrolimus (PROGRAF) 1 MG capsule Take 1 mg by mouth 2 (two) times daily. "Take two in the morning, and three at 9pm."      . aspirin-acetaminophen-caffeine (EXCEDRIN MIGRAINE) 250-250-65 MG per tablet Take 2 tablets by mouth once as needed. For headache        . ciprofloxacin (CIPRO) 750 MG tablet Take 750 mg by mouth once a week.      Marland Kitchen Epoetin Alfa (EPOGEN IJ) Inject 20,000 Units as directed.      . folic acid (FOLVITE) 1 MG tablet Take 1 mg by mouth daily.      . Lactulose 20 GM/30ML SOLN Take 20 mLs by mouth 3 (three) times daily. TID daily with goal of 3-5 bowel movements a day      . midodrine (PROAMATINE) 10 MG tablet Take 10 mg by mouth 3 (three) times daily.      . mycophenolate (MYFORTIC) 360 MG TBEC Take 360 mg by mouth 2 (two) times daily. "Take two pills in morning and one at bedtime."      . octreotide (SANDOSTATIN) 100 MCG/ML SOLN Inject 100 mcg into the skin 3 (three) times daily.      . rifaximin (XIFAXAN) 550 MG TABS Take 550 mg by mouth 2 (two) times daily.      Marland Kitchen thiamine 100 MG tablet Take 100 mg by mouth daily.      . vitamin B-12 (CYANOCOBALAMIN) 100 MCG tablet Take 100 mcg by mouth daily.       No current facility-administered medications for this visit.    History   Social History  . Marital Status: Single    Spouse Name: N/A    Number of Children: N/A  . Years of Education: N/A   Occupational History  . Not on file.   Social History Main Topics  . Smoking status: Former Smoker    Quit  date: 06/02/2002  . Smokeless tobacco: Not on file  . Alcohol Use: No     Comment: not in over a year  . Drug Use: No  . Sexual Activity: No   Other Topics Concern  . Not on file   Social History Narrative  . No narrative on file    Family History  Problem Relation  Age of Onset  . Diabetes Mother   . Heart disease Father   . Diabetes Father       Jeannette Corpus, MD 04/01/2013, 10:45 AM

## 2013-04-01 NOTE — Patient Instructions (Signed)
Plan to follow up in three to four months with an ultrasound before your visit.

## 2013-06-10 ENCOUNTER — Ambulatory Visit (HOSPITAL_COMMUNITY)
Admission: RE | Admit: 2013-06-10 | Discharge: 2013-06-10 | Disposition: A | Discharge status: Home / Self Care | Payer: 59 | Source: Ambulatory Visit | Attending: Gynecologic Oncology | Admitting: Gynecologic Oncology

## 2013-06-10 DIAGNOSIS — N83209 Unspecified ovarian cyst, unspecified side: Secondary | ICD-10-CM | POA: Insufficient documentation

## 2013-06-10 DIAGNOSIS — N72 Inflammatory disease of cervix uteri: Secondary | ICD-10-CM | POA: Insufficient documentation

## 2013-06-17 ENCOUNTER — Ambulatory Visit: Payer: 59 | Attending: Gynecology | Admitting: Gynecology

## 2013-06-17 ENCOUNTER — Encounter: Payer: Self-pay | Admitting: Gynecology

## 2013-06-17 VITALS — BP 116/73 | HR 80 | Temp 98.1°F | Resp 16 | Ht 63.0 in | Wt 179.6 lb

## 2013-06-17 DIAGNOSIS — Z87891 Personal history of nicotine dependence: Secondary | ICD-10-CM | POA: Insufficient documentation

## 2013-06-17 DIAGNOSIS — N289 Disorder of kidney and ureter, unspecified: Secondary | ICD-10-CM | POA: Insufficient documentation

## 2013-06-17 DIAGNOSIS — K7682 Hepatic encephalopathy: Secondary | ICD-10-CM | POA: Insufficient documentation

## 2013-06-17 DIAGNOSIS — Z944 Liver transplant status: Secondary | ICD-10-CM | POA: Insufficient documentation

## 2013-06-17 DIAGNOSIS — E119 Type 2 diabetes mellitus without complications: Secondary | ICD-10-CM | POA: Insufficient documentation

## 2013-06-17 DIAGNOSIS — Z79899 Other long term (current) drug therapy: Secondary | ICD-10-CM | POA: Insufficient documentation

## 2013-06-17 DIAGNOSIS — R609 Edema, unspecified: Secondary | ICD-10-CM | POA: Insufficient documentation

## 2013-06-17 DIAGNOSIS — R188 Other ascites: Secondary | ICD-10-CM | POA: Insufficient documentation

## 2013-06-17 DIAGNOSIS — I10 Essential (primary) hypertension: Secondary | ICD-10-CM | POA: Insufficient documentation

## 2013-06-17 DIAGNOSIS — N83209 Unspecified ovarian cyst, unspecified side: Secondary | ICD-10-CM | POA: Insufficient documentation

## 2013-06-17 DIAGNOSIS — E039 Hypothyroidism, unspecified: Secondary | ICD-10-CM | POA: Insufficient documentation

## 2013-06-17 DIAGNOSIS — K7290 Hepatic failure, unspecified without coma: Secondary | ICD-10-CM | POA: Insufficient documentation

## 2013-06-17 NOTE — Progress Notes (Signed)
Consult Note: Gyn-Onc   Traci Harper 58 y.o. female  No chief complaint on file.    Assessment: Ovarian cyst cysts which are relatively stable stable. There's been minimal change since the ultrasound of October 2014. The patient is asymptomatic.  Plan: We will plan a repeat ultrasound to monitor the cysts in approximately 6 months and I will see the patient thereafter. The patient is warned regarding symptoms which might necessitate surgery including acute pain or increasing pressure.  Interval history. The patient returns today as previously scheduled for followup of bilateral ovarian cysts. She had a repeat ultrasound on on 06/10/2013 which shows only slight progression in the size of the cysts and no particular worrisome findings. She reports that she's not having any pelvic symptoms and specifically denies any pain or pressure. She's not having any bleeding. Overall her functional state is improved from last visit.    HPI: 58 year old white female seen in consultation at the request of the internal medicine service at [facility] regarding management of ovarian cysts. The patient was [facility]. In the course of a workup she was found to have massive ascites and on ultrasound of the pelvis was found to have a multicystic right ovarian mass with thin septations. The largest mass measured 3.5 cm. A paracentesis was performed and no malignant cells were identified. A CA 125 was also obtained which was in excess of 700 units per mL. The patient has a past history of ovarian cysts initially recognized by the her prior gynecologist Dr. Arnette Schaumannhonda White in [place]. I have contacted Dr. Cliffton AstersWhite tells me the cyst in 2008 measured 3 x 2 cm with some small excrescences.  The patient reports no other gynecologic history except for the fact that she had a hydatidiform [facility] and what [facility] many  years ago. She subsequently had 2 children.  Review of Systems:10 point review of systems is negative as noted above.   Vitals: Blood pressure 116/73, pulse 80, temperature 98.1 F (36.7 C), temperature source Oral, resp. rate 16, height 5\' 3"  (1.6 m), weight 179 lb 9.6 oz (81.466 kg).  Physical Exam: General : The patient is a healthy woman in no acute distress.  HEENT: normocephalic, extraoccular movements normal; neck is supple without thyromegally  Lynphnodes: Supraclavicular and inguinal nodes not enlarged  Abdomen:  Minimal ascites. There are no palpable masses or palpable organomegaly. Pelvic:  EGBUS: Normal female  Vagina: Normal, no lesions  Urethra and Bladder: Normal, non-tender  Cervix: Normal Uterus: Normal shape size and consistency. I do not feel any masses or nodularity  Bi-manual examination: Non-tender; no adenxal masses or nodularity  Rectal: normal sphincter tone, no masses, no blood  Lower extremities: Moderate edema of both legs or varicosities. Normal range of motion       No Known Allergies  Past Medical History  Diagnosis Date  . Thyroid disease   . Hypertension   . Diabetes mellitus   . Hepatic encephalopathy   . Kidney insufficiency   . Hypothyroidism   . Molar pregnancy     at age 58    Past Surgical History  Procedure Laterality Date  . Hemorrhoid surgery    . Appendectomy    . Back surgery    . Cesarean section      x2  . Tubal ligation    . Thyroid surgery      x2  .  Dilation and curettage of uterus      at 18  . V-tach ablation    . Liver transplant  09/17/2012    @[email or web address]    Current Outpatient Prescriptions  Medication Sig Dispense Refill  . aspirin-acetaminophen-caffeine (EXCEDRIN MIGRAINE) 250-250-65 MG per tablet Take 2 tablets by mouth once as needed. For headache        . hydrOXYzine (ATARAX/VISTARIL) 25 MG tablet Take 25 mg by mouth 2 (two) times daily as needed.      . Levothyroxine Sodium (SYNTHROID  PO) Take 125 mcg by mouth daily.       . Multiple Vitamins-Minerals (MULTIVITAMIN PO) Take 1 tablet by mouth.      . mycophenolate (MYFORTIC) 360 MG TBEC Take 360 mg by mouth 2 (two) times daily. "Take two pills in morning and one at bedtime."      . omeprazole (PRILOSEC) 20 MG capsule       . phenylephrine (SUDAFED PE) 10 MG TABS Take 10 mg by mouth every 4 (four) hours as needed. For headache       . senna-docusate (SENOKOT-S) 8.6-50 MG per tablet Take 1 tablet by mouth daily.      . tacrolimus (PROGRAF) 1 MG capsule Take 1 mg by mouth 2 (two) times daily. "Take two in the morning, and three at 9pm."      . torsemide (DEMADEX) 10 MG tablet        No current facility-administered medications for this visit.    History   Social History  . Marital Status: Married    Spouse Name: N/A    Number of Children: N/A  . Years of Education: N/A   Occupational History  . Not on file.   Social History Main Topics  . Smoking status: Former Smoker    Quit date: 06/02/2002  . Smokeless tobacco: Not on file  . Alcohol Use: No     Comment: not in over a year  . Drug Use: No  . Sexual Activity: No   Other Topics Concern  . Not on file   Social History Narrative  . No narrative on file    Family History  Problem Relation Age of Onset  . Diabetes Mother   . Heart disease Father   . Diabetes Father       Jeannette Corpus, MD 06/17/2013, 10:54 AM

## 2013-06-17 NOTE — Patient Instructions (Signed)
Plan to follow up in six months with an ultrasound before your visit with Dr. Stanford Breedlarke-Pearson.  Please call in April or May 2015 to schedule your appointment and ultrasound or sooner if problems/issues arise.

## 2014-04-24 ENCOUNTER — Ambulatory Visit: Payer: 59 | Admitting: Gynecology

## 2014-05-08 ENCOUNTER — Ambulatory Visit: Payer: 59 | Attending: Gynecology | Admitting: Gynecology

## 2014-05-08 ENCOUNTER — Encounter: Payer: Self-pay | Admitting: Gynecology

## 2014-05-08 ENCOUNTER — Other Ambulatory Visit (HOSPITAL_COMMUNITY)
Admission: RE | Admit: 2014-05-08 | Discharge: 2014-05-08 | Disposition: A | Discharge status: Home / Self Care | Payer: 59 | Source: Ambulatory Visit | Attending: Gynecology | Admitting: Gynecology

## 2014-05-08 VITALS — BP 130/81 | HR 85 | Temp 98.1°F | Resp 20 | Ht 63.0 in | Wt 219.4 lb

## 2014-05-08 DIAGNOSIS — I10 Essential (primary) hypertension: Secondary | ICD-10-CM | POA: Diagnosis not present

## 2014-05-08 DIAGNOSIS — Z944 Liver transplant status: Secondary | ICD-10-CM | POA: Insufficient documentation

## 2014-05-08 DIAGNOSIS — Z01411 Encounter for gynecological examination (general) (routine) with abnormal findings: Secondary | ICD-10-CM | POA: Insufficient documentation

## 2014-05-08 DIAGNOSIS — K7290 Hepatic failure, unspecified without coma: Secondary | ICD-10-CM | POA: Insufficient documentation

## 2014-05-08 DIAGNOSIS — Z87891 Personal history of nicotine dependence: Secondary | ICD-10-CM | POA: Insufficient documentation

## 2014-05-08 DIAGNOSIS — Z79899 Other long term (current) drug therapy: Secondary | ICD-10-CM | POA: Diagnosis not present

## 2014-05-08 DIAGNOSIS — E119 Type 2 diabetes mellitus without complications: Secondary | ICD-10-CM | POA: Diagnosis not present

## 2014-05-08 DIAGNOSIS — N8320 Unspecified ovarian cysts: Secondary | ICD-10-CM | POA: Insufficient documentation

## 2014-05-08 DIAGNOSIS — N83209 Unspecified ovarian cyst, unspecified side: Secondary | ICD-10-CM

## 2014-05-08 DIAGNOSIS — E039 Hypothyroidism, unspecified: Secondary | ICD-10-CM | POA: Diagnosis not present

## 2014-05-08 DIAGNOSIS — N289 Disorder of kidney and ureter, unspecified: Secondary | ICD-10-CM | POA: Diagnosis not present

## 2014-05-08 NOTE — Patient Instructions (Signed)
We will call you with Pap smear results. Please give us a call in September 2016 to schedule an appt for an ultrasound and followup with Dr. Stanford Breedlarke-Pearson.

## 2014-05-08 NOTE — Progress Notes (Signed)
Consult Note: Gyn-Onc   Traci Harper 58 y.o. female  Chief Complaint  Patient presents with  . Ovarian Cyst     Assessment: Ovarian cyst cysts which are  actually smaller then in January 2015. The patient is asymptomatic.  Plan: We will plan a repeat ultrasound to monitor the cysts in approximately  1 year. She is warned about signs of increased pressure and torsion. Pap smears obtained today as she has not had a Pap smear in a number of years.   Interval history.  The patient returns today for follow-up. Since our last visit she's had [facility] which demonstrated persistence of the ovarian cyst. However, comparison of measurements between the [facility] CT scan and an ultrasound performed in January 2015 showed that the cysts are actually smaller area  Specifically the right ovarian cyst on 03/27/2014 measured 3.5 x 5.6 cm (previously 4.6 x 3.6 x 8.9 cm.  The left ovarian cyst in October 2015 measured 4.8 x 5.3 cm (previously 6.6 x 4.1 x 5.9 cm) the patient remains entirely asymptomatic. She specifically denies any pain pelvic pressure or any vaginal bleeding or discharge.  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   HPI: 58 year old white female seen in consultation at the request of the [facility] regarding management of ovarian cysts. The patient was [facility]. In the course of a workup she was found to have massive ascites and on ultrasound of the pelvis was found to have a multicystic right ovarian mass with thin septations. The largest mass measured 3.5 cm. A paracentesis was performed and no malignant cells were identified. A CA 125 was also obtained which was in excess of 700 units per mL. The patient has a past history of ovarian cysts initially recognized by the her prior gynecologist Dr. Arnette Schaumannhonda White in [place]. I have contacted  Dr. Cliffton AstersWhite tells me the cyst in 2008 measured 3 x 2 cm with some small excrescences.  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 She subsequently had 2 children.  Review of Systems:10 point review of systems is negative as noted above.   Vitals: Blood pressure 130/81, pulse 85, temperature 98.1 F (36.7 C), temperature source Oral, resp. rate 20, height 5\' 3"  (1.6 m), weight 219 lb 6.4 oz (99.519 kg).  Physical Exam: General : The patient is a healthy woman in no acute distress.  HEENT: normocephalic, extraoccular movements normal; neck is supple without thyromegally  Lynphnodes: Supraclavicular and inguinal nodes not enlarged  Abdomen:  Obese, There are no palpable masses or palpable organomegaly. Pelvic:  EGBUS: Normal female  Vagina: Normal, no lesions  Urethra and Bladder: Normal, non-tender  Cervix: Normal Uterus: Normal shape size and consistency. I do not feel any masses or nodularity  Bi-manual examination: Non-tender; no adenxal masses or nodularity  Rectal: normal sphincter tone, no masses, no blood  Lower extremities: Moderate edema of both legs or varicosities. Normal range of motion       No Known Allergies  Past Medical History  Diagnosis Date  . Thyroid disease   . Hypertension   . Diabetes mellitus   . Hepatic encephalopathy   . Kidney insufficiency   . Hypothyroidism   .  Molar pregnancy     at age 58    Past Surgical History  Procedure Laterality Date  . Hemorrhoid surgery    . Appendectomy    . Back surgery    . Cesarean section      x2  . Tubal ligation    . Thyroid surgery      x2  . Dilation and curettage of uterus      at 18  . V-tach ablation    . Liver transplant  09/17/2012    @[email or web address] [email or web address]    Current Outpatient Prescriptions  Medication Sig Dispense  Refill  . BAYER CONTOUR NEXT TEST test strip 2 (two) times daily. for testing  3  . levothyroxine (SYNTHROID, LEVOTHROID) 175 MCG tablet Take 175 mcg by mouth daily before breakfast.    . magnesium oxide (MAG-OX) 400 (241.3 MG) MG tablet Take 400 mg by mouth 2 (two) times daily.   11  . metFORMIN (GLUCOPHAGE) 1000 MG tablet Take 1,000 mg by mouth 2 (two) times daily.  11  . Multiple Vitamin (ONE DAILY) tablet Take by mouth daily.     . mycophenolate (MYFORTIC) 360 MG TBEC Take 360 mg by mouth 2 (two) times daily. "Take two pills in morning and one at bedtime."    . omeprazole (PRILOSEC) 20 MG capsule Take 20 mg by mouth daily.     Marland Kitchen. senna-docusate (SENOKOT-S) 8.6-50 MG per tablet Take 1 tablet by mouth daily.    . tacrolimus (PROGRAF) 1 MG capsule Take 1 mg by mouth 2 (two) times daily. "Take two in the morning, and three at 9pm."    . torsemide (DEMADEX) 10 MG tablet Take 10 mg by mouth once.     . phenylephrine (SUDAFED PE) 10 MG TABS Take 10 mg by mouth every 4 (four) hours as needed. For headache      No current facility-administered medications for this visit.    History   Social History  . Marital Status: Married    Spouse Name: N/A    Number of Children: N/A  . Years of Education: N/A   Occupational History  . Not on file.   Social History Main Topics  . Smoking status: Former Smoker    Quit date: 06/02/2002  . Smokeless tobacco: Not on file  . Alcohol Use: No     Comment: not in over a year  . Drug Use: No  . Sexual Activity: No   Other Topics Concern  . Not on file   Social History Narrative    Family History  Problem Relation Age of Onset  . Diabetes Mother   . Heart disease Father   . Diabetes Father       Jeannette CorpusLARKE-PEARSON,Josehua Hammar L, MD 05/08/2014, 1:26 PM

## 2014-05-11 LAB — CYTOLOGY - PAP

## 2014-05-12 ENCOUNTER — Telehealth: Payer: Self-pay | Admitting: Nurse Practitioner

## 2014-05-12 NOTE — Telephone Encounter (Signed)
Patient informed that pap result is normal. Patient understands and thanks for the call.

## 2014-06-12 IMAGING — US US PELVIS COMPLETE
1 series · 13 of 25 positions shown · non-contrast
Comparison: 12/17/2012

CLINICAL DATA: Followup ovarian cyst.  Prior liver transplant.



[Series 1: us pelvis complete · 0.24mm/px · 83 acquisitions, 13 frames shown]
[im 1/83]
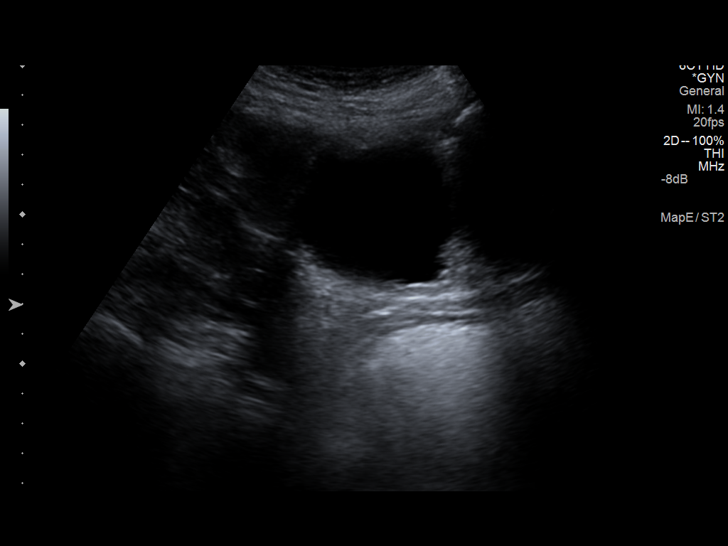
[im 7/83]
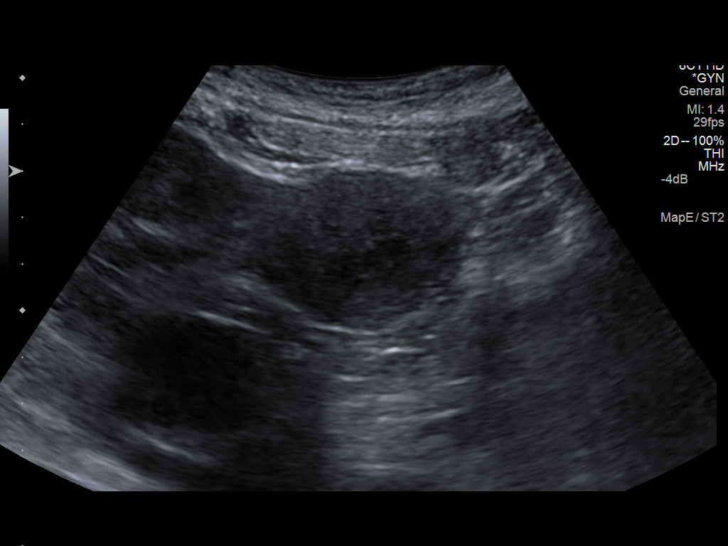
[im 14/83]
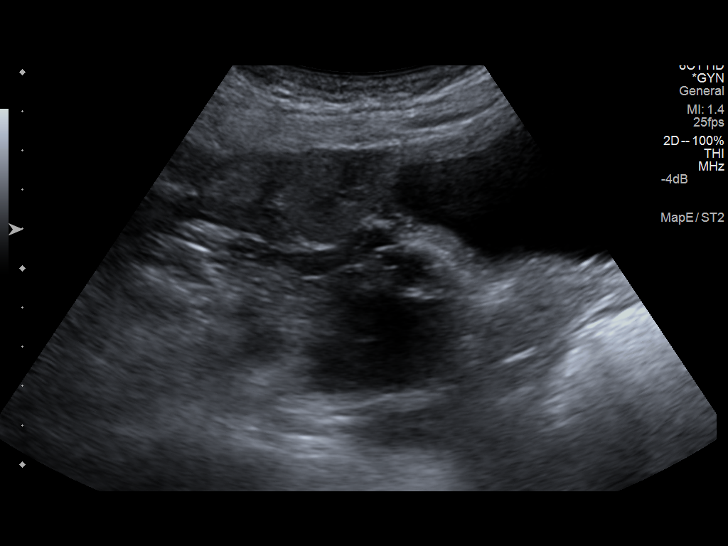
[im 21/83]
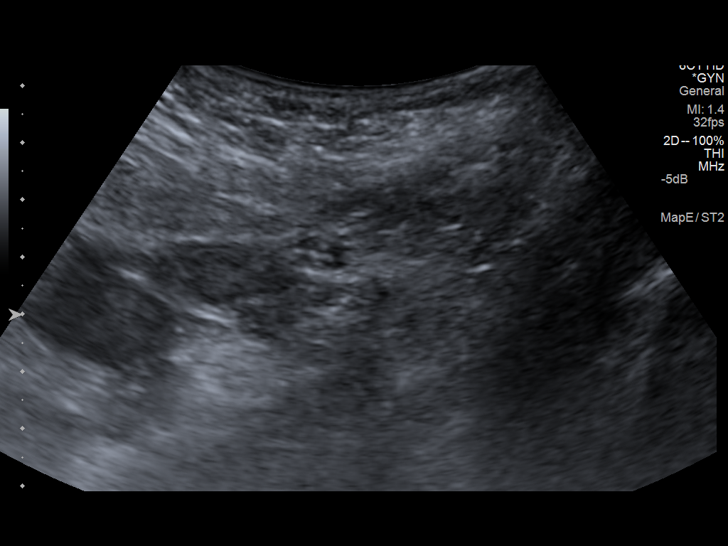
[im 28/83]
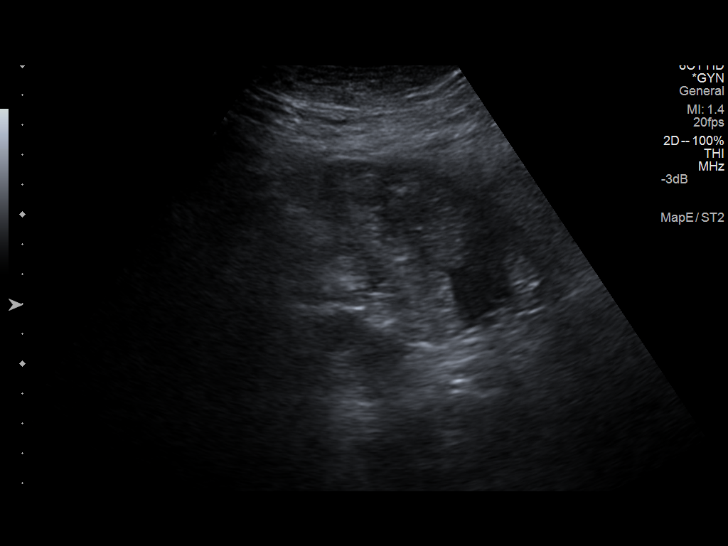
[im 35/83]
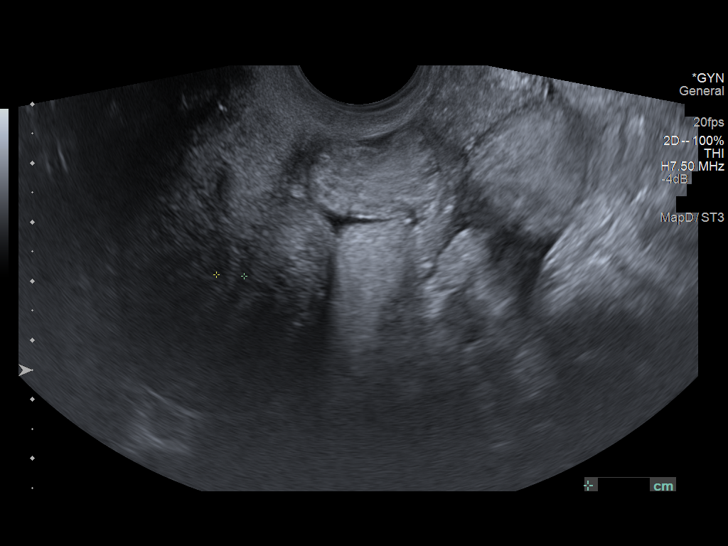
[im 42/83]
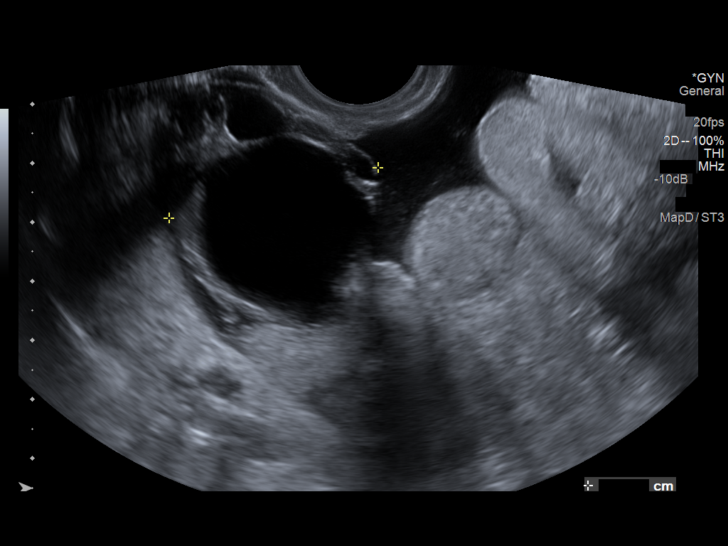
[im 48/83]
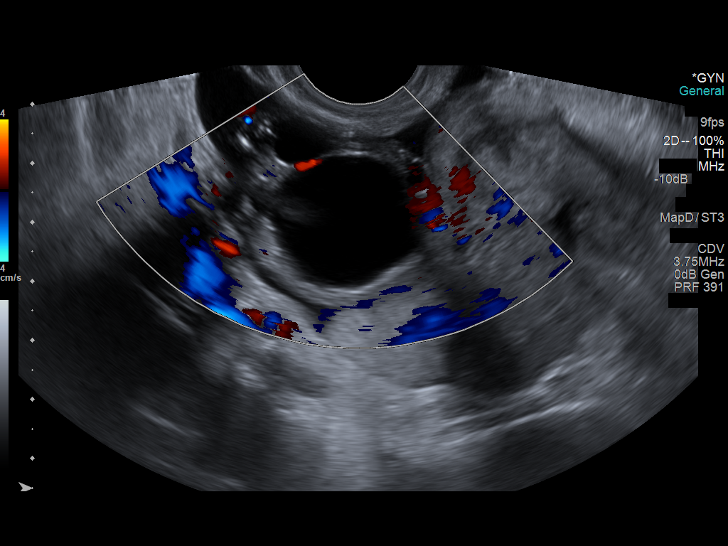
[im 55/83]
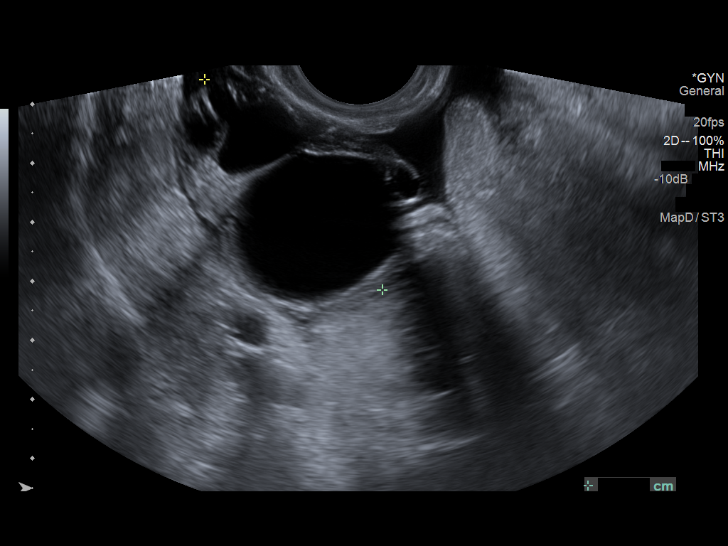
[im 62/83]
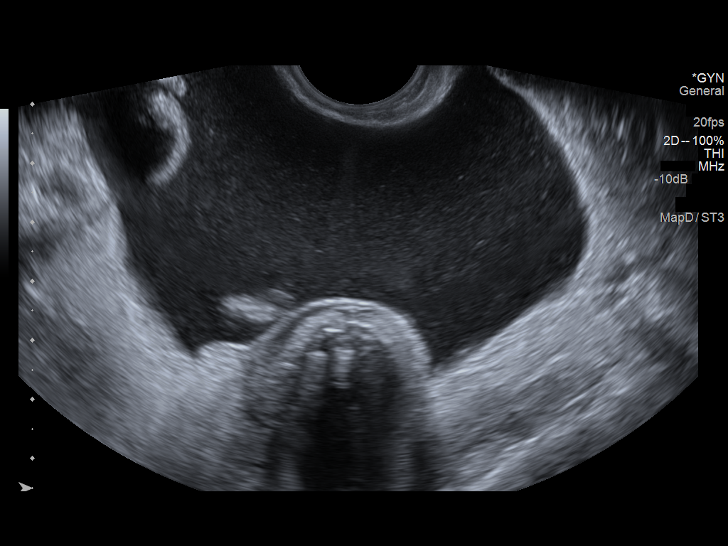
[im 69/83]
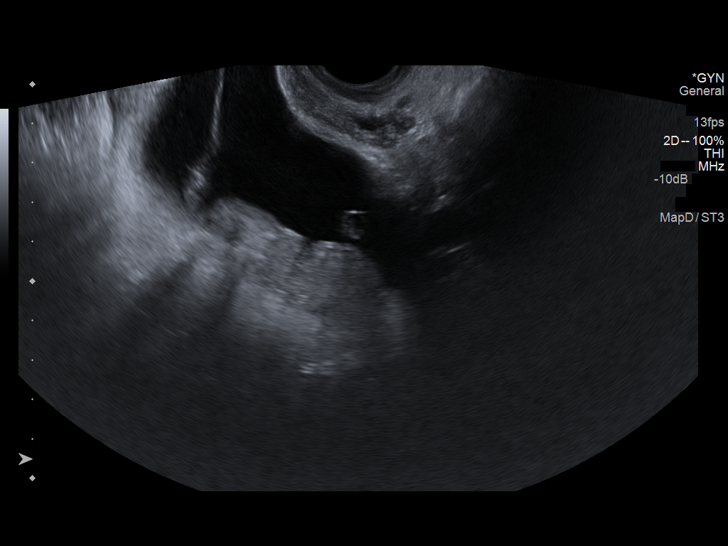
[im 76/83]
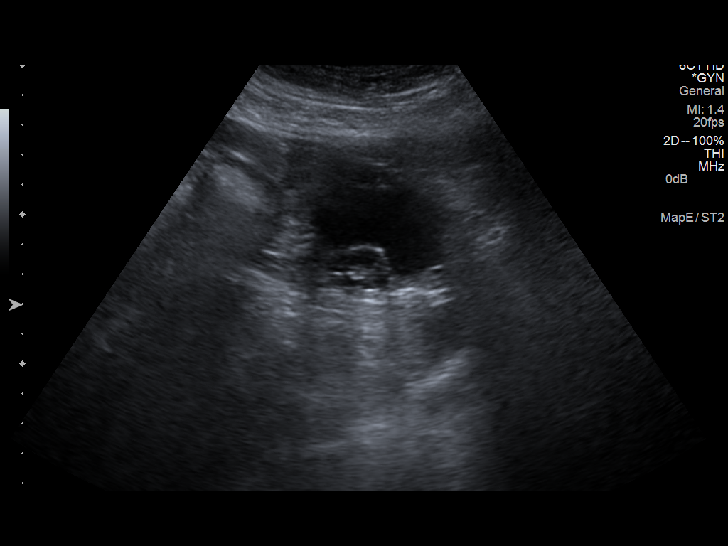
[im 83/83]
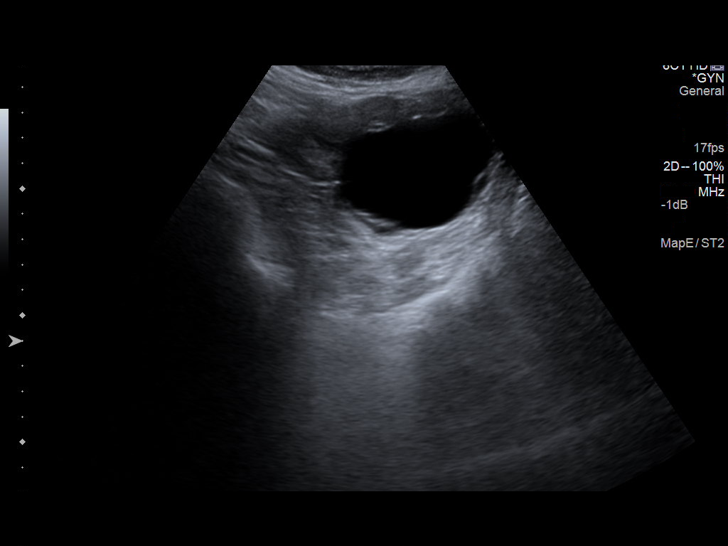

[13 of 25 positions shown; findings below may reference images not displayed]

FINDINGS: Uterus

Measurements: 7.3 x 3.7 x 4.6 cm.. No fibroids or other mass
visualized.

Endometrium

Thickness: 5 mm.  No focal abnormality visualized.

Right ovary

Measurements: 4.7 x 3.2 x 3.6 cm, containing multiple cystic lesions
with the largest measuring 3.0 x 2.4 x 3.1 cm. These appear simple.
On color Doppler there is flow within the ovarian parenchyma..

Left ovary

Measurements: 6.3 x 4.9 x 6.7 cm. The internal 5.6 x 4.4 x 5.7 cm
cyst best seen transabdominally. Subtle septation along the margin
of the cyst particularly superiorly. There is potentially some
subtle septation inferiorly as well. Possible mural nodularity.

Other findings

Large amount of complex ascites in the pelvis.
IMPRESSION: 1. Large complex cystic lesion left ovary with possible mural
nodularity and with internal septations. Smaller cystic lesions in
the right ovary. Surgical evaluation should be strongly considered.
If surgical evaluation is not elected, ovarian protocol MRI of the
pelvis with and without contrast might be utilized for further
characterization.
2. Complex ascites.

## 2014-08-22 IMAGING — US US PELVIS COMPLETE
1 series · 3 of 3 positions shown · non-contrast
Comparison: 03/31/2013

CLINICAL DATA: Followup ovarian cyst

EXAM:
TRANSABDOMINAL AND TRANSVAGINAL ULTRASOUND OF PELVIS
TECHNIQUE: Both transabdominal and transvaginal ultrasound examinations of the
pelvis were performed. Transabdominal technique was performed for
global imaging of the pelvis including uterus, ovaries, adnexal
regions, and pelvic cul-de-sac. It was necessary to proceed with
endovaginal exam following the transabdominal exam to visualize the
uterus and endometrium.

[Series 1: us pelvis complete · 0.14mm/px · 3 of 3 slices shown]
[im 1/3]
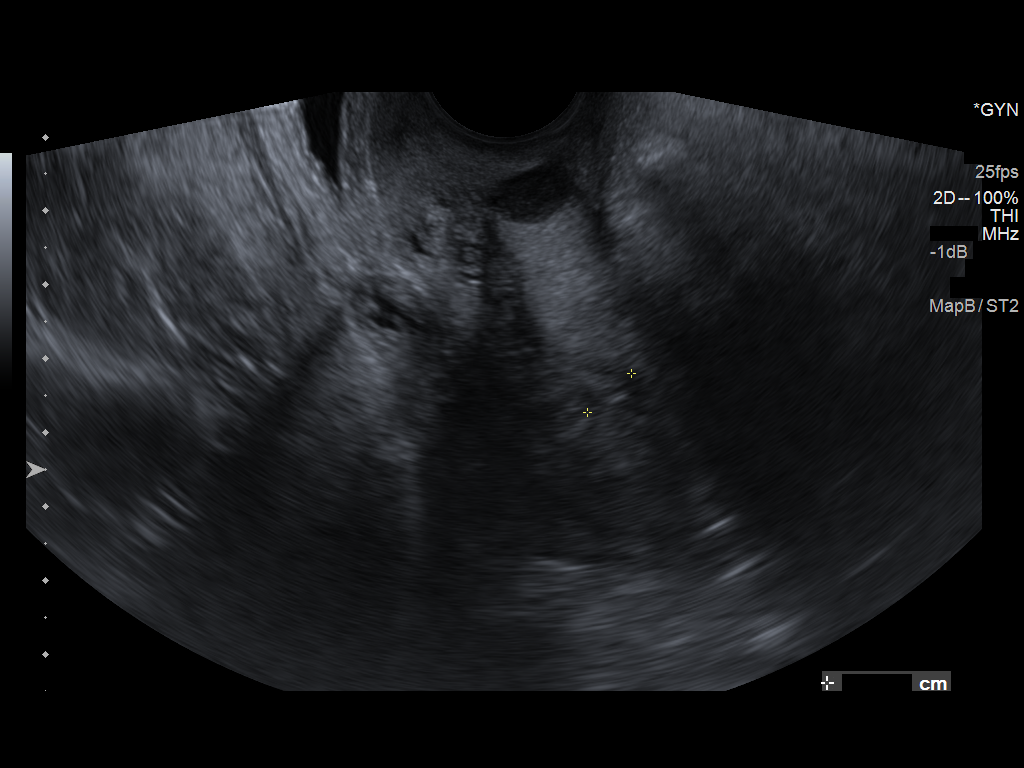
[im 2/3]
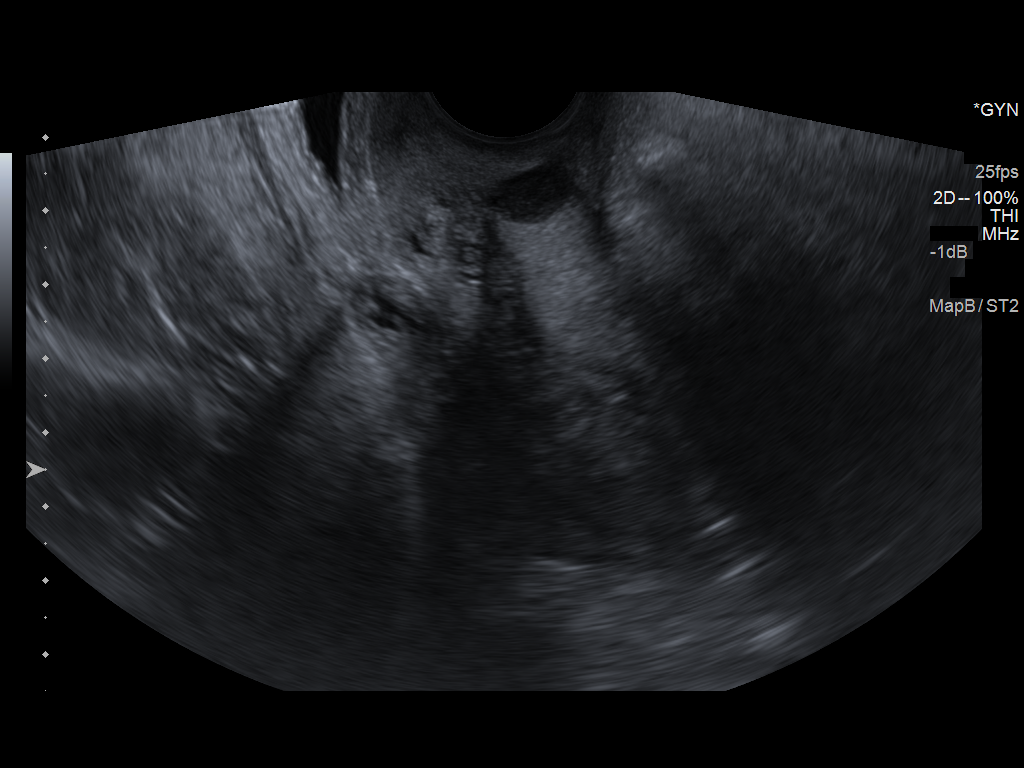
[im 3/3]
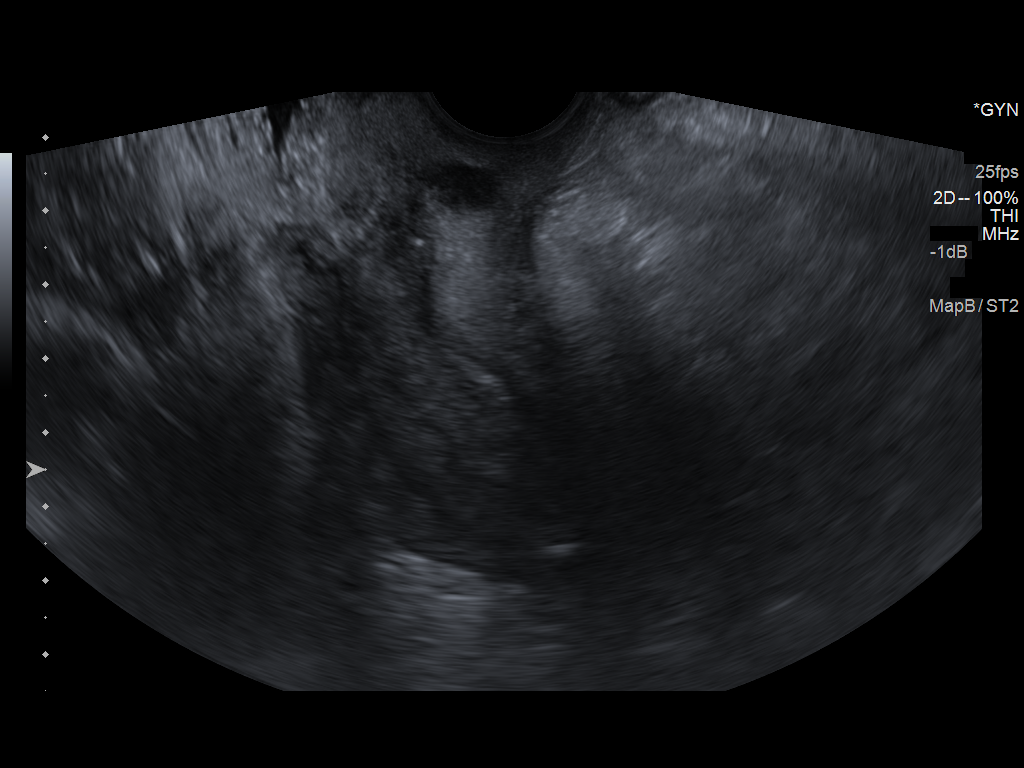

[3 of 3 positions shown; findings below may reference images not displayed]

FINDINGS: Uterus

Measurements: 7.3 x 3.7 x 4.5 cm.. Complex appearing nabothian cyst
is noted within the lower uterine segment measuring 1.4 x 0.9 x
cm.

Endometrium

Thickness: 8 mm..  No focal abnormality visualized.

Right ovary

Measurements: 4.6 x 3.6 x 8.9 cm. Multiple cysts are identified
within the right ovary. The largest measures 3.3 x 2.6 x 3.3 cm. No
complicating features or associated with the cysts.

Left ovary

Measurements: 6.6 x 4.1 x 5.9 cm. There is a large cyst within the
left ovary. This measures 6.1 x 3.8 x 5.0 cm. Previously 5.6 x 4.4 x
5.7 cm. Again noted is subtle septation along the margin of the
cyst.

Other findings

Small amount of free fluid is noted within the pelvis.
IMPRESSION: 1. Persistent complex cystic lesion within the left ovary. Surgical
evaluation should be considered. If surgical evaluation is not
elected then ovarian protocol MRI of the pelvis with in without
contrast would be advised.
2. Decrease in volume of ascites.

## 2015-05-03 ENCOUNTER — Telehealth: Payer: Self-pay | Admitting: *Deleted

## 2015-05-03 ENCOUNTER — Other Ambulatory Visit: Payer: Self-pay | Admitting: Gynecologic Oncology

## 2015-05-03 DIAGNOSIS — N83209 Unspecified ovarian cyst, unspecified side: Secondary | ICD-10-CM

## 2015-05-03 NOTE — Telephone Encounter (Signed)
Notified patient of scheduled appointments. Pt is scheduled for a U/S on 05/08/2015. Pt has an appointment with Dr. Stanford Breedlarke-Pearson on 05/11/2015. Pt agreed with appointment times and dates

## 2015-05-08 ENCOUNTER — Ambulatory Visit (HOSPITAL_COMMUNITY)
Admission: RE | Admit: 2015-05-08 | Discharge: 2015-05-08 | Disposition: A | Discharge status: Home / Self Care | Payer: BLUE CROSS/BLUE SHIELD | Source: Ambulatory Visit | Attending: Gynecologic Oncology | Admitting: Gynecologic Oncology

## 2015-05-08 DIAGNOSIS — N83202 Unspecified ovarian cyst, left side: Secondary | ICD-10-CM | POA: Diagnosis not present

## 2015-05-08 DIAGNOSIS — N83201 Unspecified ovarian cyst, right side: Secondary | ICD-10-CM | POA: Diagnosis not present

## 2015-05-08 DIAGNOSIS — N83209 Unspecified ovarian cyst, unspecified side: Secondary | ICD-10-CM

## 2015-05-11 ENCOUNTER — Ambulatory Visit: Payer: BLUE CROSS/BLUE SHIELD | Attending: Gynecology | Admitting: Gynecology

## 2015-05-11 ENCOUNTER — Encounter: Payer: Self-pay | Admitting: Gynecology

## 2015-05-11 VITALS — BP 135/79 | HR 92 | Temp 98.3°F | Resp 18 | Ht 63.0 in | Wt 235.2 lb

## 2015-05-11 DIAGNOSIS — N83202 Unspecified ovarian cyst, left side: Secondary | ICD-10-CM | POA: Diagnosis not present

## 2015-05-11 DIAGNOSIS — N83201 Unspecified ovarian cyst, right side: Secondary | ICD-10-CM

## 2015-05-11 DIAGNOSIS — N83209 Unspecified ovarian cyst, unspecified side: Secondary | ICD-10-CM | POA: Diagnosis present

## 2015-05-11 NOTE — Patient Instructions (Signed)
Plan for repeat ultrasound in one year with an appt to see Dr. Stanford Breedlarke-Pearson after.  We do not have our schedule out at this time for Dec 2017 so please call in August or Sept 2017 to schedule your ultrasound and appt with Dr. Stanford Breedlarke-Pearson.  Please call for any questions or concerns or for any new abdominal symptoms.

## 2015-05-11 NOTE — Progress Notes (Signed)
Consult Note: Gyn-Onc   Traci Harper 59 y.o. female  No chief complaint on file.    Assessment: Ovarian cyst cysts which are  actually smaller than in January 2015. The patient is asymptomatic.  Plan: We will plan a repeat ultrasound to monitor the cysts in approximately  1 year. She is warned about signs of increased pressure and torsion.  The patient has gained considerable amount of weight over the past year she is encouraged to get into a weight reduction program.  Interval history.  The patient returns today for follow-up she had a follow-up pelvic ultrasound on 05/08/2015 showing continued decrease in size of bilateral ovarian cysts when compared to the ultrasound of 06/10/2013. Specifically the right ovary has multiple cysts in aggregate measuring 5.1 x 2.5 x 4.4 cm. The largest cyst measures 3.1 x 1.8 1.2 cm (previously measuring 3.3 x 2.6 x 3.3 cm.  The left ovary has multiple cysts in aggregate measuring 5 x 3.9 x 4.6 cm. The largest cyst now measures 4.2 x 2.4 x 2.8 cm (previously 6.1 x 3.8 x 5 cm) there is no free fluid in the pelvis and the uterus and endometrium are unremarkable..  The patient reports no other gynecologic history except for the fact that she had a hydatidiform mole treated by [facility] and what sounds like intramuscular methotrexate at [facility] many years ago. She subsequently had 2 children.  Review of Systems:10 point review of systems is negative as noted above.   Vitals: Blood pressure 135/79, pulse 92, temperature 98.3 F (36.8 C), temperature source Oral, resp. rate 18, height  (1.6 m), weight 235 lb 3.2 oz (106.686 kg), SpO2 100 %.  Physical Exam: General : The patient is a healthy woman in no acute distress.  HEENT: normocephalic, extraoccular movements normal; neck is supple without thyromegally  Lynphnodes: Supraclavicular and inguinal nodes not enlarged  Abdomen:  Obese, There are no palpable masses or palpable  organomegaly. Pelvic:  EGBUS: Normal female  Vagina: Normal, no lesions  Urethra and Bladder: Normal, non-tender  Cervix: Normal Uterus: Normal shape size and consistency. I do not feel any masses or nodularity  Bi-manual examination: Non-tender; no adenxal masses or nodularity  Rectal: normal sphincter tone, no masses, no blood  Lower extremities: Moderate edema of both legs or varicosities. Normal range of motion       No Known Allergies  Past Medical History  Diagnosis Date  . Thyroid disease   . Hypertension   . Diabetes mellitus   . Hepatic encephalopathy (HCC)   . Kidney insufficiency   . Hypothyroidism   . Molar pregnancy     at age 28    Past Surgical History  Procedure Laterality Date  . Hemorrhoid surgery    . Appendectomy    . Back surgery    . Cesarean section      x2  . Tubal ligation    . Thyroid surgery      x2  . Dilation and curettage of uterus      at 18  . V-tach ablation    . Liver [place]  09/17/2012     [facility] [place]    Current Outpatient Prescriptions  Medication Sig Dispense Refill  . BAYER CONTOUR NEXT TEST test strip 2 (two) times daily. for testing  3  . fluticasone (FLONASE) 50 MCG/ACT nasal spray Place into the nose as needed.     Marland Kitchen JANUMET 50-1000 MG tablet Take 1 tablet by mouth 2 (two)  times daily with a meal.  3  . levothyroxine (SYNTHROID, LEVOTHROID) 175 MCG tablet Take 175 mcg by mouth daily before breakfast.    . magnesium oxide (MAG-OX) 400 (241.3 MG) MG tablet Take 400 mg by mouth 2 (two) times daily.   11  . Multiple Vitamin (ONE DAILY) tablet Take 1 tablet by mouth daily.     . mycophenolate (MYFORTIC) 360 MG TBEC Take 360 mg by mouth 2 (two) times daily.     Marland Kitchen. omeprazole (PRILOSEC) 20 MG capsule Take 20 mg by mouth daily.     . phenylephrine (SUDAFED PE) 10 MG TABS Take 10 mg by mouth every 4 (four) hours as needed. For headache     . PROGRAF 0.5 MG capsule Take 0.5 mg by mouth. Two tablets in the morning  and 1 tablet in the evening  10  . torsemide (DEMADEX) 10 MG tablet Take 10 mg by mouth every other day.     . senna-docusate (SENOKOT-S) 8.6-50 MG per tablet Take 1 tablet by mouth at bedtime as needed.      No current facility-administered medications for this visit.    Social History   Social History  . Marital Status: Married    Spouse Name: N/A  . Number of Children: N/A  . Years of Education: N/A   Occupational History  . Not on file.   Social History Main Topics  . Smoking status: Former Smoker    Quit date: 06/02/2002  . Smokeless tobacco: Not on file  . Alcohol Use: No     Comment: No alcohol since 2012  . Drug Use: No  . Sexual Activity: No   Other Topics Concern  . Not on file   Social History Narrative    Family History  Problem Relation Age of Onset  . Diabetes Mother   . Heart disease Father   . Diabetes Father       Jeannette CorpusLARKE-PEARSON,Agape Hardiman L, MD 05/11/2015, 3:14 PM

## 2018-02-10 LAB — TSH
TSH: 1.32 (ref 0.41–5.90)
TSH: 1.32 (ref 0.41–5.90)

## 2018-02-10 LAB — BASIC METABOLIC PANEL
BUN: 16 (ref 4–21)
BUN: 16 (ref 4–21)
CO2: 30 — AB (ref 13–22)
Chloride: 105 (ref 99–108)
Creatinine: 1 (ref 0.5–1.1)
Creatinine: 1 (ref 0.5–1.1)
Glucose: 83
Glucose: 83
Potassium: 4.7 (ref 3.4–5.3)
Sodium: 142 (ref 137–147)

## 2018-02-10 LAB — CBC AND DIFFERENTIAL
HCT: 41 (ref 36–46)
Hemoglobin: 13.5 (ref 12.0–16.0)
Neutrophils Absolute: 5.4
Platelets: 255 (ref 150–399)
WBC: 8.3

## 2018-02-10 LAB — HEPATIC FUNCTION PANEL
ALT: 37 — AB (ref 7–35)
ALT: 37 — AB (ref 7–35)
AST: 23 (ref 13–35)
AST: 23 (ref 13–35)
Alkaline Phosphatase: 53 (ref 25–125)
Alkaline Phosphatase: 53 (ref 25–125)
Bilirubin, Total: 0.5
Bilirubin, Total: 0.5

## 2018-02-10 LAB — HEMOGLOBIN A1C
Hemoglobin A1C: 6.7
Hemoglobin A1C: 6.7

## 2018-02-10 LAB — COMPREHENSIVE METABOLIC PANEL
Albumin: 4.1 (ref 3.5–5.0)
Albumin: 4.1 (ref 3.5–5.0)
Calcium: 9.3 (ref 8.7–10.7)
Calcium: 9.3 (ref 8.7–10.7)
GFR calc Af Amer: 69
GFR calc Af Amer: 69
GFR calc non Af Amer: 60
GFR calc non Af Amer: 60

## 2018-02-10 LAB — CBC: RBC: 4.31 (ref 3.87–5.11)

## 2018-08-11 LAB — LIPID PANEL
Cholesterol: 194 (ref 0–200)
HDL: 47 (ref 35–70)
LDL Cholesterol: 118
Triglycerides: 310 — AB (ref 40–160)

## 2018-08-11 LAB — CBC AND DIFFERENTIAL
HCT: 41 (ref 36–46)
Hemoglobin: 13.6 (ref 12.0–16.0)
Neutrophils Absolute: 4.5
Platelets: 254 (ref 150–399)
WBC: 7.8

## 2018-08-11 LAB — COMPREHENSIVE METABOLIC PANEL
Albumin: 4.1 (ref 3.5–5.0)
Calcium: 9.8 (ref 8.7–10.7)
GFR calc non Af Amer: 55

## 2018-08-11 LAB — BASIC METABOLIC PANEL
BUN: 15 (ref 4–21)
CO2: 31 — AB (ref 13–22)
Chloride: 102 (ref 99–108)
Creatinine: 1.1 (ref 0.5–1.1)
Glucose: 118
Potassium: 5 (ref 3.4–5.3)
Sodium: 140 (ref 137–147)

## 2018-08-11 LAB — VITAMIN B12: Vitamin B-12: 731

## 2018-08-11 LAB — IRON,TIBC AND FERRITIN PANEL: Ferritin: 24

## 2018-08-11 LAB — HEPATIC FUNCTION PANEL
ALT: 32 (ref 7–35)
AST: 21 (ref 13–35)

## 2018-08-11 LAB — HEMOGLOBIN A1C: Hemoglobin A1C: 6.8

## 2018-08-11 LAB — CBC: RBC: 4.31 (ref 3.87–5.11)

## 2018-12-01 DIAGNOSIS — Z944 Liver transplant status: Secondary | ICD-10-CM | POA: Diagnosis not present

## 2018-12-01 DIAGNOSIS — Z298 Encounter for other specified prophylactic measures: Secondary | ICD-10-CM | POA: Diagnosis not present

## 2018-12-07 DIAGNOSIS — R202 Paresthesia of skin: Secondary | ICD-10-CM | POA: Diagnosis not present

## 2018-12-07 DIAGNOSIS — R200 Anesthesia of skin: Secondary | ICD-10-CM | POA: Diagnosis not present

## 2018-12-07 DIAGNOSIS — M545 Low back pain: Secondary | ICD-10-CM | POA: Diagnosis not present

## 2018-12-07 DIAGNOSIS — M79604 Pain in right leg: Secondary | ICD-10-CM | POA: Diagnosis not present

## 2019-01-10 DIAGNOSIS — Z298 Encounter for other specified prophylactic measures: Secondary | ICD-10-CM | POA: Diagnosis not present

## 2019-01-10 DIAGNOSIS — Z944 Liver transplant status: Secondary | ICD-10-CM | POA: Diagnosis not present

## 2019-01-14 DIAGNOSIS — E559 Vitamin D deficiency, unspecified: Secondary | ICD-10-CM | POA: Diagnosis not present

## 2019-01-14 DIAGNOSIS — E782 Mixed hyperlipidemia: Secondary | ICD-10-CM | POA: Diagnosis not present

## 2019-01-14 DIAGNOSIS — E119 Type 2 diabetes mellitus without complications: Secondary | ICD-10-CM | POA: Diagnosis not present

## 2019-01-14 DIAGNOSIS — I10 Essential (primary) hypertension: Secondary | ICD-10-CM | POA: Diagnosis not present

## 2019-01-14 LAB — TSH: TSH: 0.39 — AB (ref <=5.90)

## 2019-01-14 LAB — LIPID PANEL
Cholesterol: 186 (ref 0–200)
HDL: 46 (ref 35–70)
LDL Cholesterol: 111
Triglycerides: 409 — AB (ref 40–160)

## 2019-01-14 LAB — IRON,TIBC AND FERRITIN PANEL
%SAT: 23
Ferritin: 37
Iron: 99
TIBC: 428
UIBC: 329

## 2019-01-14 LAB — HEMOGLOBIN A1C: Hemoglobin A1C: 6.2

## 2019-01-21 DIAGNOSIS — N83209 Unspecified ovarian cyst, unspecified side: Secondary | ICD-10-CM | POA: Diagnosis not present

## 2019-01-21 DIAGNOSIS — D649 Anemia, unspecified: Secondary | ICD-10-CM | POA: Diagnosis not present

## 2019-01-21 DIAGNOSIS — E119 Type 2 diabetes mellitus without complications: Secondary | ICD-10-CM | POA: Diagnosis not present

## 2019-01-21 DIAGNOSIS — Z0000 Encounter for general adult medical examination without abnormal findings: Secondary | ICD-10-CM | POA: Diagnosis not present

## 2019-01-21 DIAGNOSIS — Z23 Encounter for immunization: Secondary | ICD-10-CM | POA: Diagnosis not present

## 2019-01-21 DIAGNOSIS — I10 Essential (primary) hypertension: Secondary | ICD-10-CM | POA: Diagnosis not present

## 2019-01-21 LAB — BASIC METABOLIC PANEL
BUN: 18 (ref 4–21)
CO2: 31 — AB (ref 13–22)
Chloride: 101 (ref 99–108)
Creatinine: 1.1 (ref 0.5–1.1)
Glucose: 119
Potassium: 5.2 (ref 3.4–5.3)
Sodium: 140 (ref 137–147)

## 2019-01-21 LAB — CBC AND DIFFERENTIAL
HCT: 38 (ref 36–46)
Hemoglobin: 12.8 (ref 12.0–16.0)
Neutrophils Absolute: 4.9
Platelets: 242 (ref 150–399)
WBC: 8.3

## 2019-01-21 LAB — COMPREHENSIVE METABOLIC PANEL
Albumin: 4.1 (ref 3.5–5.0)
Calcium: 9.6 (ref 8.7–10.7)
GFR calc non Af Amer: 54

## 2019-01-21 LAB — HEPATIC FUNCTION PANEL
ALT: 26 (ref 7–35)
AST: 14 (ref 13–35)
Alkaline Phosphatase: 46 (ref 25–125)
Bilirubin, Total: 0.4

## 2019-01-21 LAB — CBC: RBC: 3.98 (ref 3.87–5.11)

## 2019-03-01 DIAGNOSIS — N905 Atrophy of vulva: Secondary | ICD-10-CM | POA: Diagnosis not present

## 2019-03-01 DIAGNOSIS — Z1151 Encounter for screening for human papillomavirus (HPV): Secondary | ICD-10-CM | POA: Diagnosis not present

## 2019-03-01 DIAGNOSIS — Z01419 Encounter for gynecological examination (general) (routine) without abnormal findings: Secondary | ICD-10-CM | POA: Diagnosis not present

## 2019-03-01 DIAGNOSIS — N83209 Unspecified ovarian cyst, unspecified side: Secondary | ICD-10-CM | POA: Diagnosis not present

## 2019-03-01 DIAGNOSIS — Z124 Encounter for screening for malignant neoplasm of cervix: Secondary | ICD-10-CM | POA: Diagnosis not present

## 2019-03-31 DIAGNOSIS — Z6841 Body Mass Index (BMI) 40.0 and over, adult: Secondary | ICD-10-CM | POA: Diagnosis not present

## 2019-03-31 DIAGNOSIS — N83291 Other ovarian cyst, right side: Secondary | ICD-10-CM | POA: Diagnosis not present

## 2019-03-31 DIAGNOSIS — N83292 Other ovarian cyst, left side: Secondary | ICD-10-CM | POA: Diagnosis not present

## 2019-04-05 DIAGNOSIS — L853 Xerosis cutis: Secondary | ICD-10-CM | POA: Diagnosis not present

## 2019-04-05 DIAGNOSIS — D2271 Melanocytic nevi of right lower limb, including hip: Secondary | ICD-10-CM | POA: Diagnosis not present

## 2019-04-05 DIAGNOSIS — D2270 Melanocytic nevi of unspecified lower limb, including hip: Secondary | ICD-10-CM | POA: Diagnosis not present

## 2019-04-05 DIAGNOSIS — D1801 Hemangioma of skin and subcutaneous tissue: Secondary | ICD-10-CM | POA: Diagnosis not present

## 2019-04-13 ENCOUNTER — Telehealth: Payer: Self-pay | Admitting: *Deleted

## 2019-04-13 NOTE — Telephone Encounter (Signed)
Per request fax records to [facility]

## 2019-04-20 DIAGNOSIS — Z298 Encounter for other specified prophylactic measures: Secondary | ICD-10-CM | POA: Diagnosis not present

## 2019-04-20 DIAGNOSIS — Z944 Liver transplant status: Secondary | ICD-10-CM | POA: Diagnosis not present

## 2019-07-18 DIAGNOSIS — Z298 Encounter for other specified prophylactic measures: Secondary | ICD-10-CM | POA: Diagnosis not present

## 2019-07-18 DIAGNOSIS — Z944 Liver transplant status: Secondary | ICD-10-CM | POA: Diagnosis not present

## 2019-07-26 DIAGNOSIS — Z6841 Body Mass Index (BMI) 40.0 and over, adult: Secondary | ICD-10-CM | POA: Diagnosis not present

## 2019-07-26 DIAGNOSIS — E119 Type 2 diabetes mellitus without complications: Secondary | ICD-10-CM | POA: Diagnosis not present

## 2019-07-26 DIAGNOSIS — E782 Mixed hyperlipidemia: Secondary | ICD-10-CM | POA: Diagnosis not present

## 2019-08-01 DIAGNOSIS — E119 Type 2 diabetes mellitus without complications: Secondary | ICD-10-CM | POA: Diagnosis not present

## 2019-08-01 DIAGNOSIS — E039 Hypothyroidism, unspecified: Secondary | ICD-10-CM | POA: Diagnosis not present

## 2019-08-01 DIAGNOSIS — Z6841 Body Mass Index (BMI) 40.0 and over, adult: Secondary | ICD-10-CM | POA: Diagnosis not present

## 2019-08-01 DIAGNOSIS — Z1159 Encounter for screening for other viral diseases: Secondary | ICD-10-CM | POA: Diagnosis not present

## 2019-08-01 DIAGNOSIS — E782 Mixed hyperlipidemia: Secondary | ICD-10-CM | POA: Diagnosis not present

## 2019-08-01 DIAGNOSIS — I10 Essential (primary) hypertension: Secondary | ICD-10-CM | POA: Diagnosis not present

## 2019-08-11 DIAGNOSIS — Z1231 Encounter for screening mammogram for malignant neoplasm of breast: Secondary | ICD-10-CM | POA: Diagnosis not present

## 2019-09-08 DIAGNOSIS — I10 Essential (primary) hypertension: Secondary | ICD-10-CM | POA: Diagnosis not present

## 2019-09-08 DIAGNOSIS — E039 Hypothyroidism, unspecified: Secondary | ICD-10-CM | POA: Diagnosis not present

## 2019-09-10 DIAGNOSIS — Z20828 Contact with and (suspected) exposure to other viral communicable diseases: Secondary | ICD-10-CM | POA: Diagnosis not present

## 2019-09-10 DIAGNOSIS — Z03818 Encounter for observation for suspected exposure to other biological agents ruled out: Secondary | ICD-10-CM | POA: Diagnosis not present

## 2019-09-30 DIAGNOSIS — Z298 Encounter for other specified prophylactic measures: Secondary | ICD-10-CM | POA: Diagnosis not present

## 2019-09-30 DIAGNOSIS — Z944 Liver transplant status: Secondary | ICD-10-CM | POA: Diagnosis not present

## 2019-10-05 DIAGNOSIS — E875 Hyperkalemia: Secondary | ICD-10-CM | POA: Diagnosis not present

## 2019-10-05 DIAGNOSIS — Z944 Liver transplant status: Secondary | ICD-10-CM | POA: Diagnosis not present

## 2019-10-05 DIAGNOSIS — D849 Immunodeficiency, unspecified: Secondary | ICD-10-CM | POA: Diagnosis not present

## 2019-10-24 DIAGNOSIS — E119 Type 2 diabetes mellitus without complications: Secondary | ICD-10-CM | POA: Diagnosis not present

## 2019-10-24 DIAGNOSIS — I10 Essential (primary) hypertension: Secondary | ICD-10-CM | POA: Diagnosis not present

## 2019-10-24 DIAGNOSIS — E039 Hypothyroidism, unspecified: Secondary | ICD-10-CM | POA: Diagnosis not present

## 2019-11-10 DIAGNOSIS — R42 Dizziness and giddiness: Secondary | ICD-10-CM | POA: Diagnosis not present

## 2020-01-09 DIAGNOSIS — R05 Cough: Secondary | ICD-10-CM | POA: Diagnosis not present

## 2020-01-09 DIAGNOSIS — U071 COVID-19: Secondary | ICD-10-CM | POA: Diagnosis not present

## 2020-02-02 DIAGNOSIS — Z944 Liver transplant status: Secondary | ICD-10-CM | POA: Diagnosis not present

## 2020-02-02 DIAGNOSIS — Z940 Kidney transplant status: Secondary | ICD-10-CM | POA: Diagnosis not present

## 2020-02-02 DIAGNOSIS — D849 Immunodeficiency, unspecified: Secondary | ICD-10-CM | POA: Diagnosis not present

## 2020-02-14 DIAGNOSIS — M5440 Lumbago with sciatica, unspecified side: Secondary | ICD-10-CM | POA: Diagnosis not present

## 2020-02-14 DIAGNOSIS — G8929 Other chronic pain: Secondary | ICD-10-CM | POA: Diagnosis not present

## 2020-02-14 DIAGNOSIS — Z981 Arthrodesis status: Secondary | ICD-10-CM | POA: Diagnosis not present

## 2020-02-14 DIAGNOSIS — M25551 Pain in right hip: Secondary | ICD-10-CM | POA: Diagnosis not present

## 2020-02-22 DIAGNOSIS — M5440 Lumbago with sciatica, unspecified side: Secondary | ICD-10-CM | POA: Diagnosis not present

## 2020-02-22 DIAGNOSIS — M7631 Iliotibial band syndrome, right leg: Secondary | ICD-10-CM | POA: Diagnosis not present

## 2020-02-22 DIAGNOSIS — M25559 Pain in unspecified hip: Secondary | ICD-10-CM | POA: Diagnosis not present

## 2020-03-08 DIAGNOSIS — M6281 Muscle weakness (generalized): Secondary | ICD-10-CM | POA: Diagnosis not present

## 2020-03-08 DIAGNOSIS — R262 Difficulty in walking, not elsewhere classified: Secondary | ICD-10-CM | POA: Diagnosis not present

## 2020-03-08 DIAGNOSIS — M7631 Iliotibial band syndrome, right leg: Secondary | ICD-10-CM | POA: Diagnosis not present

## 2020-03-12 DIAGNOSIS — M6281 Muscle weakness (generalized): Secondary | ICD-10-CM | POA: Diagnosis not present

## 2020-03-12 DIAGNOSIS — R262 Difficulty in walking, not elsewhere classified: Secondary | ICD-10-CM | POA: Diagnosis not present

## 2020-03-12 DIAGNOSIS — M7631 Iliotibial band syndrome, right leg: Secondary | ICD-10-CM | POA: Diagnosis not present

## 2020-03-20 DIAGNOSIS — R262 Difficulty in walking, not elsewhere classified: Secondary | ICD-10-CM | POA: Diagnosis not present

## 2020-03-20 DIAGNOSIS — M5440 Lumbago with sciatica, unspecified side: Secondary | ICD-10-CM | POA: Diagnosis not present

## 2020-03-20 DIAGNOSIS — M6281 Muscle weakness (generalized): Secondary | ICD-10-CM | POA: Diagnosis not present

## 2020-03-20 DIAGNOSIS — M7631 Iliotibial band syndrome, right leg: Secondary | ICD-10-CM | POA: Diagnosis not present

## 2020-03-20 DIAGNOSIS — M25551 Pain in right hip: Secondary | ICD-10-CM | POA: Diagnosis not present

## 2020-03-20 DIAGNOSIS — M25559 Pain in unspecified hip: Secondary | ICD-10-CM | POA: Diagnosis not present

## 2020-03-22 DIAGNOSIS — M6281 Muscle weakness (generalized): Secondary | ICD-10-CM | POA: Diagnosis not present

## 2020-03-22 DIAGNOSIS — M7631 Iliotibial band syndrome, right leg: Secondary | ICD-10-CM | POA: Diagnosis not present

## 2020-03-22 DIAGNOSIS — R262 Difficulty in walking, not elsewhere classified: Secondary | ICD-10-CM | POA: Diagnosis not present

## 2020-04-02 DIAGNOSIS — M6281 Muscle weakness (generalized): Secondary | ICD-10-CM | POA: Diagnosis not present

## 2020-04-02 DIAGNOSIS — R262 Difficulty in walking, not elsewhere classified: Secondary | ICD-10-CM | POA: Diagnosis not present

## 2020-04-02 DIAGNOSIS — M7631 Iliotibial band syndrome, right leg: Secondary | ICD-10-CM | POA: Diagnosis not present

## 2020-04-04 DIAGNOSIS — M6281 Muscle weakness (generalized): Secondary | ICD-10-CM | POA: Diagnosis not present

## 2020-04-04 DIAGNOSIS — M7631 Iliotibial band syndrome, right leg: Secondary | ICD-10-CM | POA: Diagnosis not present

## 2020-04-04 DIAGNOSIS — R262 Difficulty in walking, not elsewhere classified: Secondary | ICD-10-CM | POA: Diagnosis not present

## 2020-04-10 DIAGNOSIS — D2260 Melanocytic nevi of unspecified upper limb, including shoulder: Secondary | ICD-10-CM | POA: Diagnosis not present

## 2020-04-10 DIAGNOSIS — D1801 Hemangioma of skin and subcutaneous tissue: Secondary | ICD-10-CM | POA: Diagnosis not present

## 2020-04-10 DIAGNOSIS — D849 Immunodeficiency, unspecified: Secondary | ICD-10-CM | POA: Diagnosis not present

## 2020-04-10 DIAGNOSIS — L853 Xerosis cutis: Secondary | ICD-10-CM | POA: Diagnosis not present

## 2020-04-11 DIAGNOSIS — R262 Difficulty in walking, not elsewhere classified: Secondary | ICD-10-CM | POA: Diagnosis not present

## 2020-04-11 DIAGNOSIS — M7631 Iliotibial band syndrome, right leg: Secondary | ICD-10-CM | POA: Diagnosis not present

## 2020-04-11 DIAGNOSIS — M6281 Muscle weakness (generalized): Secondary | ICD-10-CM | POA: Diagnosis not present

## 2020-04-19 DIAGNOSIS — M6281 Muscle weakness (generalized): Secondary | ICD-10-CM | POA: Diagnosis not present

## 2020-04-19 DIAGNOSIS — R262 Difficulty in walking, not elsewhere classified: Secondary | ICD-10-CM | POA: Diagnosis not present

## 2020-04-19 DIAGNOSIS — M7631 Iliotibial band syndrome, right leg: Secondary | ICD-10-CM | POA: Diagnosis not present

## 2020-04-20 ENCOUNTER — Ambulatory Visit: Payer: BLUE CROSS/BLUE SHIELD | Admitting: Family Medicine

## 2020-04-23 ENCOUNTER — Other Ambulatory Visit: Payer: Self-pay

## 2020-04-23 ENCOUNTER — Encounter: Payer: Self-pay | Admitting: Family Medicine

## 2020-04-23 ENCOUNTER — Ambulatory Visit (INDEPENDENT_AMBULATORY_CARE_PROVIDER_SITE_OTHER): Payer: BC Managed Care – PPO | Admitting: Family Medicine

## 2020-04-23 VITALS — BP 137/85 | HR 75 | Temp 98.0°F | Ht 63.0 in | Wt 217.4 lb

## 2020-04-23 DIAGNOSIS — E039 Hypothyroidism, unspecified: Secondary | ICD-10-CM | POA: Diagnosis not present

## 2020-04-23 DIAGNOSIS — Z944 Liver transplant status: Secondary | ICD-10-CM

## 2020-04-23 DIAGNOSIS — E1165 Type 2 diabetes mellitus with hyperglycemia: Secondary | ICD-10-CM

## 2020-04-23 DIAGNOSIS — E1159 Type 2 diabetes mellitus with other circulatory complications: Secondary | ICD-10-CM

## 2020-04-23 DIAGNOSIS — I152 Hypertension secondary to endocrine disorders: Secondary | ICD-10-CM | POA: Insufficient documentation

## 2020-04-23 DIAGNOSIS — K219 Gastro-esophageal reflux disease without esophagitis: Secondary | ICD-10-CM

## 2020-04-23 LAB — TSH: TSH: 0.09 mIU/L — ABNORMAL LOW (ref 0.40–4.50)

## 2020-04-23 MED ORDER — TRULICITY 1.5 MG/0.5ML ~~LOC~~ SOAJ: 1.5000 mg | SUBCUTANEOUS | 3 refills | Status: DC

## 2020-04-23 NOTE — Patient Instructions (Signed)
It was very nice to see you today!  I will refill your Trulicity.  We will check blood work today.  I would like to get the records from your previous doctor.  I will probably see you back in 3 to 6 months depending on the results of your blood work.  Come back to see me sooner if needed.  Take care, Dr [name]  Please try these tips to maintain a healthy lifestyle:   Eat at least 3 REAL meals and 1-2 snacks per day.  Aim for no more than 5 hours between eating.  If you eat breakfast, please do so within one hour of getting up.    Each meal should contain half fruits/vegetables, one quarter protein, and one quarter carbs (no bigger than a computer mouse)   Cut down on sweet beverages. This includes juice, soda, and sweet tea.     Drink at least 1 glass of water with each meal and aim for at least 8 glasses per day   Exercise at least 150 minutes every week.

## 2020-04-23 NOTE — Assessment & Plan Note (Signed)
Follows with [facility] transplant team. On prograf and mycophenylate.

## 2020-04-23 NOTE — Assessment & Plan Note (Signed)
On levothyroxine daily. Will check TSH.

## 2020-04-23 NOTE — Assessment & Plan Note (Addendum)
On trulicity 1.5mg  weekly and janumet 50-1000 twice daily. Will check A1c.  One-time records from previous PCP.  Not clear why she is on Janumet and Trulicity as she is not likely getting much benefit from the Januvia at this point.  Depending on results of A1c will likely need to switch medications.

## 2020-04-23 NOTE — Progress Notes (Signed)
Traci Harper is a 64 y.o. female who presents today for an office visit.  She is a new patient.   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 On prograf and mycophenylate.   Type 2 diabetes mellitus with hyperglycemia (HCC) On trulicity 1.5mg  weekly and janumet 50-1000 twice daily. Will check A1c.  One-time records from previous PCP.  Not clear why she is on Janumet and Trulicity as she is not likely getting much benefit from the Januvia at this point.  Depending on results of A1c will likely need to switch medications.  Hypothyroidism On levothyroxine daily. Will check TSH.   GERD (gastroesophageal reflux disease) On prilosec 20mg  daily and tolerating well.   Preventative health care Obtain records from previous PCP.  Patient reports she is up-to-date on colonoscopy, mammogram, and Pap.     Subjective:  HPI:  Patient is here to establish care as a new patient.  Please see A/P for status of chronic conditions.  She is doing well today.  Has no acute complaints.  ROS: Per HPI, otherwise a complete review of systems was negative.   PMH:  The following were reviewed and entered/updated in epic: Past Medical History:  Diagnosis Date  . Diabetes mellitus   . Hepatic encephalopathy (HCC)   . Hypertension   . Hypothyroidism   . Kidney insufficiency   . Molar pregnancy    at age 61  . Thyroid disease    Patient Active Problem List   Diagnosis Date Noted  . Hypertension associated with diabetes (HCC) 04/23/2020  . Type 2 diabetes mellitus with hyperglycemia (HCC) 04/23/2020  . Hypothyroidism 04/23/2020  . GERD (gastroesophageal reflux disease) 04/23/2020  . Liver transplanted 2014 [facility]) follows with [facility] 04/01/2013   Past Surgical History:  Procedure Laterality Date  . APPENDECTOMY    . BACK SURGERY    . CESAREAN SECTION     x2  . DILATION AND CURETTAGE OF UTERUS      at 18  . HEMORRHOID SURGERY    . LIVER TRANSPLANT  09/17/2012   [email or web address]  . THYROID SURGERY     x2  . TUBAL LIGATION    . V-TACH ABLATION      Family History  Problem Relation Age of Onset  . Diabetes Mother   . Heart disease Father   . Diabetes Father     Medications- reviewed and updated Current Outpatient Medications  Medication Sig Dispense Refill  . Ascorbic Acid (VITAMIN C) 100 MG tablet Take 100 mg by mouth daily.    09/19/2012 aspirin EC 81 MG tablet Take 81 mg by mouth daily. Swallow whole.    . calcium citrate-vitamin D (CITRACAL+D) 315-200 MG-UNIT tablet Take 1 tablet by mouth 2 (two) times daily.    . Dulaglutide (TRULICITY) 1.5 MG/0.5ML SOPN Inject 1.5 mg into the skin once a week. 6 mL 3  . glucose blood test strip 1 each by Other route as needed for other. Use as instructed    . JANUMET 50-1000 MG tablet Take 1 tablet by mouth 2 (two) times daily with a meal.  3  . levothyroxine (SYNTHROID, LEVOTHROID) 175 MCG tablet Take 175 mcg by mouth daily before breakfast.    . magnesium oxide (MAG-OX) 400 (241.3 MG) MG tablet Take 400 mg by mouth 2 (two) times daily.   11  . metoprolol tartrate (LOPRESSOR) 50 MG tablet Take 50 mg by mouth 2 (two)  times daily.    . Multiple Vitamin (ONE DAILY) tablet Take 1 tablet by mouth daily.     . mycophenolate (MYFORTIC) 360 MG TBEC Take 360 mg by mouth 2 (two) times daily.     Marland Kitchen olmesartan (BENICAR) 5 MG tablet Take by mouth daily.    Marland Kitchen omeprazole (PRILOSEC) 20 MG capsule Take 20 mg by mouth daily.     Marland Kitchen PROGRAF 0.5 MG capsule Take 0.5 mg by mouth. Two tablets in the morning and 1 tablet in the evening  10   No current facility-administered medications for this visit.    Allergies-reviewed and updated Allergies  Allergen Reactions  . Lisinopril Cough    Social History   Socioeconomic History  . Marital status: Married    Spouse name: Not on file  . Number of children: Not on file  . Years of education: Not on file  .  Highest education level: Not on file  Occupational History  . Not on file  Tobacco Use  . Smoking status: Former Smoker    Quit date: 06/02/2002    Years since quitting: 17.9  Substance and Sexual Activity  . Alcohol use: No    Comment: No alcohol since 2012  . Drug use: No  . Sexual activity: Never  Other Topics Concern  . Not on file  Social History Narrative  . Not on file   Social Determinants of Health   Financial Resource Strain:   . Difficulty of Paying Living Expenses: Not on file  Food Insecurity:   . Worried About Programme researcher, broadcasting/film/video in the Last Year: Not on file  . Ran Out of Food in the Last Year: Not on file  Transportation Needs:   . Lack of Transportation (Medical): Not on file  . Lack of Transportation (Non-Medical): Not on file  Physical Activity:   . Days of Exercise per Week: Not on file  . Minutes of Exercise per Session: Not on file  Stress:   . Feeling of Stress : Not on file  Social Connections:   . Frequency of Communication with Friends and Family: Not on file  . Frequency of Social Gatherings with Friends and Family: Not on file  . Attends Religious Services: Not on file  . Active Member of Clubs or Organizations: Not on file  . Attends Banker Meetings: Not on file  . Marital Status: Not on file         Objective:  Physical Exam: BP 137/85   Pulse 75   Temp 98 F (36.7 C) (Temporal)   Ht 5\' 3"  (1.6 m)   Wt 217 lb 6.4 oz (98.6 kg)   SpO2 96%   BMI 38.51 kg/m   Gen: No acute distress, resting comfortably CV: Regular rate and rhythm with no murmurs appreciated Pulm: Normal work of breathing, clear to auscultation bilaterally with no crackles, wheezes, or rhonchi Neuro: Grossly normal, moves all extremities Psych: Normal affect and thought content      Traci Dorsainvil M. , MD 04/23/2020 12:04 PM

## 2020-04-23 NOTE — Assessment & Plan Note (Signed)
On prilosec 20mg  daily and tolerating well.

## 2020-04-24 LAB — HEMOGLOBIN A1C
Hgb A1c MFr Bld: 6.7 % of total Hgb — ABNORMAL HIGH (ref <5.7)
Mean Plasma Glucose: 146 (calc)
eAG (mmol/L): 8.1 (calc)

## 2020-04-25 ENCOUNTER — Other Ambulatory Visit: Payer: Self-pay

## 2020-04-25 DIAGNOSIS — E039 Hypothyroidism, unspecified: Secondary | ICD-10-CM

## 2020-04-25 NOTE — Progress Notes (Signed)
Please inform patient of the following:  A1c is at goal but looks like she is getting a bit too much thyroid. Would like to check her thyroid level one more time before making a dose change.  Would like for her to come back in 4-6 weeks to recheck TSH. She can continue her current dose until then.  Traci Harper. [name], MD 04/25/2020 9:30 AM

## 2020-04-30 DIAGNOSIS — R262 Difficulty in walking, not elsewhere classified: Secondary | ICD-10-CM | POA: Diagnosis not present

## 2020-04-30 DIAGNOSIS — M7631 Iliotibial band syndrome, right leg: Secondary | ICD-10-CM | POA: Diagnosis not present

## 2020-04-30 DIAGNOSIS — M6281 Muscle weakness (generalized): Secondary | ICD-10-CM | POA: Diagnosis not present

## 2020-05-03 DIAGNOSIS — M7631 Iliotibial band syndrome, right leg: Secondary | ICD-10-CM | POA: Diagnosis not present

## 2020-05-03 DIAGNOSIS — M6281 Muscle weakness (generalized): Secondary | ICD-10-CM | POA: Diagnosis not present

## 2020-05-03 DIAGNOSIS — R262 Difficulty in walking, not elsewhere classified: Secondary | ICD-10-CM | POA: Diagnosis not present

## 2020-05-07 DIAGNOSIS — M7631 Iliotibial band syndrome, right leg: Secondary | ICD-10-CM | POA: Diagnosis not present

## 2020-05-07 DIAGNOSIS — R262 Difficulty in walking, not elsewhere classified: Secondary | ICD-10-CM | POA: Diagnosis not present

## 2020-05-07 DIAGNOSIS — M6281 Muscle weakness (generalized): Secondary | ICD-10-CM | POA: Diagnosis not present

## 2020-05-09 ENCOUNTER — Other Ambulatory Visit: Payer: Self-pay | Admitting: *Deleted

## 2020-05-09 DIAGNOSIS — E1165 Type 2 diabetes mellitus with hyperglycemia: Secondary | ICD-10-CM

## 2020-05-10 DIAGNOSIS — Z944 Liver transplant status: Secondary | ICD-10-CM | POA: Diagnosis not present

## 2020-05-10 DIAGNOSIS — Z940 Kidney transplant status: Secondary | ICD-10-CM | POA: Diagnosis not present

## 2020-05-10 DIAGNOSIS — D849 Immunodeficiency, unspecified: Secondary | ICD-10-CM | POA: Diagnosis not present

## 2020-05-11 DIAGNOSIS — M6281 Muscle weakness (generalized): Secondary | ICD-10-CM | POA: Diagnosis not present

## 2020-05-11 DIAGNOSIS — M7631 Iliotibial band syndrome, right leg: Secondary | ICD-10-CM | POA: Diagnosis not present

## 2020-05-11 DIAGNOSIS — R262 Difficulty in walking, not elsewhere classified: Secondary | ICD-10-CM | POA: Diagnosis not present

## 2020-05-30 ENCOUNTER — Other Ambulatory Visit: Payer: Self-pay | Admitting: *Deleted

## 2020-05-30 ENCOUNTER — Encounter: Payer: Self-pay | Admitting: Family Medicine

## 2020-05-30 ENCOUNTER — Other Ambulatory Visit (INDEPENDENT_AMBULATORY_CARE_PROVIDER_SITE_OTHER): Payer: BC Managed Care – PPO

## 2020-05-30 ENCOUNTER — Other Ambulatory Visit: Payer: Self-pay

## 2020-05-30 DIAGNOSIS — E1165 Type 2 diabetes mellitus with hyperglycemia: Secondary | ICD-10-CM

## 2020-05-30 DIAGNOSIS — E039 Hypothyroidism, unspecified: Secondary | ICD-10-CM

## 2020-05-30 LAB — TSH: TSH: 0.05 u[IU]/mL — ABNORMAL LOW (ref 0.35–4.50)

## 2020-05-30 LAB — HEMOGLOBIN A1C: Hgb A1c MFr Bld: 6.8 % — ABNORMAL HIGH (ref 4.6–6.5)

## 2020-05-30 MED ORDER — LEVOTHYROXINE SODIUM 150 MCG PO TABS: 150.0000 ug | ORAL_TABLET | Freq: Every day | ORAL | 3 refills | Status: DC

## 2020-05-30 NOTE — Progress Notes (Signed)
t

## 2020-05-30 NOTE — Addendum Note (Signed)
Addended by: Lurlean Horns on: 05/30/2020 09:04 AM   Modules accepted: Orders

## 2020-05-30 NOTE — Progress Notes (Signed)
Please inform patient of the following:  She is still getting too much thyroid. Would like to decrease synthroid to daily and we can recheck in 4-6 weeks.  Katina Degree. [name], MD 05/30/2020 11:11 AM

## 2020-05-30 NOTE — Addendum Note (Signed)
Addended by: Clorissa Gruenberg P on: 05/30/2020 09:04 AM   Modules accepted: Orders  

## 2020-06-05 DIAGNOSIS — M7631 Iliotibial band syndrome, right leg: Secondary | ICD-10-CM | POA: Diagnosis not present

## 2020-06-05 DIAGNOSIS — R262 Difficulty in walking, not elsewhere classified: Secondary | ICD-10-CM | POA: Diagnosis not present

## 2020-06-05 DIAGNOSIS — M6281 Muscle weakness (generalized): Secondary | ICD-10-CM | POA: Diagnosis not present

## 2020-06-20 ENCOUNTER — Telehealth: Payer: Self-pay

## 2020-06-20 NOTE — Telephone Encounter (Signed)
Ok for refill? Last rf 2015 by historical provider.

## 2020-06-20 NOTE — Telephone Encounter (Signed)
..   LAST APPOINTMENT DATE: 04/23/2020   NEXT APPOINTMENT DATE:@5 /23/2022  MEDICATION:magnesium oxide (MAG-OX) 400 (241.3 MG) MG tablet    [facility]ACY[facility] [address]

## 2020-06-21 MED ORDER — MAGNESIUM OXIDE 400 (241.3 MG) MG PO TABS: 400.0000 mg | ORAL_TABLET | Freq: Two times a day (BID) | ORAL | 2 refills | Status: DC

## 2020-06-21 NOTE — Telephone Encounter (Signed)
Medication refilled

## 2020-06-21 NOTE — Telephone Encounter (Signed)
Ok with me. Please place any necessary orders. 

## 2020-06-21 NOTE — Addendum Note (Signed)
Addended by: Lieutenant Diego A on: 06/21/2020 02:52 PM   Modules accepted: Orders

## 2020-06-27 ENCOUNTER — Encounter: Payer: Self-pay | Admitting: Family Medicine

## 2020-06-27 ENCOUNTER — Other Ambulatory Visit: Payer: Self-pay | Admitting: *Deleted

## 2020-06-27 ENCOUNTER — Encounter: Payer: Self-pay | Admitting: *Deleted

## 2020-07-16 ENCOUNTER — Other Ambulatory Visit (INDEPENDENT_AMBULATORY_CARE_PROVIDER_SITE_OTHER): Payer: BC Managed Care – PPO

## 2020-07-16 ENCOUNTER — Telehealth: Payer: Self-pay

## 2020-07-16 ENCOUNTER — Other Ambulatory Visit: Payer: Self-pay

## 2020-07-16 DIAGNOSIS — E039 Hypothyroidism, unspecified: Secondary | ICD-10-CM | POA: Diagnosis not present

## 2020-07-16 LAB — TSH: TSH: 0.61 u[IU]/mL (ref 0.35–4.50)

## 2020-07-16 MED ORDER — OLMESARTAN MEDOXOMIL 5 MG PO TABS: 5.0000 mg | ORAL_TABLET | Freq: Every day | ORAL | 0 refills | Status: DC

## 2020-07-16 MED ORDER — METOPROLOL TARTRATE 50 MG PO TABS: 50.0000 mg | ORAL_TABLET | Freq: Two times a day (BID) | ORAL | 0 refills | Status: DC

## 2020-07-16 NOTE — Telephone Encounter (Signed)
MEDICATION: metoprolol tartrate (LOPRESSOR) 50 MG tablet  olmesartan (BENICAR) 5 MG tablet  PHARMACY:  [id number] [place], [place] - [address] Phone:  [phone]  Fax:  [phone]       Comments:   **Let patient know to contact pharmacy at the end of the day to make sure medication is ready. **  ** Please notify patient to allow 48-72 hours to process**  **Encourage patient to contact the pharmacy for refills or they can request refills through [facility]**

## 2020-07-16 NOTE — Progress Notes (Signed)
Please inform patient of the following:  Thryoid levels are at goal. She can continue her current dose of synthroid and we can recheck next time she comes in for an office visit.  Katina Degree. [name], MD 07/16/2020 3:56 PM

## 2020-07-16 NOTE — Telephone Encounter (Signed)
Sent to pharmacy 

## 2020-07-17 ENCOUNTER — Telehealth: Payer: Self-pay

## 2020-07-17 NOTE — Telephone Encounter (Signed)
Noted  

## 2020-07-17 NOTE — Telephone Encounter (Signed)
Patient has called Francena Hanly back in regard to labs.  I have advised her what Dr. Rhys Martini has stated.  Patient understood and had no questions.

## 2020-07-23 ENCOUNTER — Telehealth: Payer: Self-pay | Admitting: *Deleted

## 2020-07-23 NOTE — Telephone Encounter (Signed)
Approved today  Your PA request has been approved. Additional information will be provided in the approval communication. (Message 1145)

## 2020-07-23 NOTE — Telephone Encounter (Signed)
PA Case ID: [id number]  Drug Trulicity 1.5MG /0.5ML pen-injectors Form [employer] Electronic PA Form [phone] NCPDP) Send today waiting for determination

## 2020-08-10 ENCOUNTER — Encounter: Payer: Self-pay | Admitting: Family Medicine

## 2020-08-23 DIAGNOSIS — D849 Immunodeficiency, unspecified: Secondary | ICD-10-CM | POA: Diagnosis not present

## 2020-08-23 DIAGNOSIS — Z944 Liver transplant status: Secondary | ICD-10-CM | POA: Diagnosis not present

## 2020-09-15 ENCOUNTER — Other Ambulatory Visit: Payer: Self-pay | Admitting: Family Medicine

## 2020-10-04 DIAGNOSIS — D849 Immunodeficiency, unspecified: Secondary | ICD-10-CM | POA: Diagnosis not present

## 2020-10-04 DIAGNOSIS — Z944 Liver transplant status: Secondary | ICD-10-CM | POA: Diagnosis not present

## 2020-10-10 DIAGNOSIS — Z944 Liver transplant status: Secondary | ICD-10-CM | POA: Diagnosis not present

## 2020-10-10 DIAGNOSIS — D849 Immunodeficiency, unspecified: Secondary | ICD-10-CM | POA: Diagnosis not present

## 2020-10-10 DIAGNOSIS — R109 Unspecified abdominal pain: Secondary | ICD-10-CM | POA: Diagnosis not present

## 2020-10-14 ENCOUNTER — Other Ambulatory Visit: Payer: Self-pay | Admitting: Family Medicine

## 2020-10-22 ENCOUNTER — Ambulatory Visit: Payer: BC Managed Care – PPO | Admitting: Family Medicine

## 2020-10-22 ENCOUNTER — Encounter: Payer: Self-pay | Admitting: Family Medicine

## 2020-10-22 ENCOUNTER — Other Ambulatory Visit: Payer: Self-pay

## 2020-10-22 VITALS — BP 131/82 | HR 85 | Temp 97.2°F | Ht 63.0 in | Wt 227.4 lb

## 2020-10-22 DIAGNOSIS — E1165 Type 2 diabetes mellitus with hyperglycemia: Secondary | ICD-10-CM

## 2020-10-22 DIAGNOSIS — E039 Hypothyroidism, unspecified: Secondary | ICD-10-CM

## 2020-10-22 DIAGNOSIS — T753XXA Motion sickness, initial encounter: Secondary | ICD-10-CM

## 2020-10-22 DIAGNOSIS — I152 Hypertension secondary to endocrine disorders: Secondary | ICD-10-CM

## 2020-10-22 DIAGNOSIS — E1159 Type 2 diabetes mellitus with other circulatory complications: Secondary | ICD-10-CM | POA: Diagnosis not present

## 2020-10-22 LAB — POCT GLYCOSYLATED HEMOGLOBIN (HGB A1C): Hemoglobin A1C: 6.6 % — AB (ref 4.0–5.6)

## 2020-10-22 MED ORDER — SCOPOLAMINE 1 MG/3DAYS TD PT72: 1.0000 | MEDICATED_PATCH | TRANSDERMAL | 12 refills | Status: DC

## 2020-10-22 NOTE — Assessment & Plan Note (Signed)
She is doing well with Synthroid 150 mcg daily.  He can check TSH next blood draw.

## 2020-10-22 NOTE — Assessment & Plan Note (Signed)
A1c 6.6.  She is on Trulicity 1.5 mg weekly and Janumet 50-1000 twice daily.  Discussed potentially switching Janumet to metformin alone however she has upcoming insurance change and we will defer making any changes today.  We can recheck A1c in 6 months and can rediscuss switching her Janumet at that point.

## 2020-10-22 NOTE — Patient Instructions (Signed)
It was very nice to see you today!  Your A1c looks great today.  We would not make any medication changes today though at some point we can switch your Janumet to metformin alone.  I will send in scopolamine patches.  I will see you back in 6 months for your annual physical.  Come back to see me sooner if needed.  Take care, Dr [name]  PLEASE NOTE:  If you had any lab tests please let us know if you have not heard back within a few days. You may see your results on mychart before we have a chance to review them but we will give you a call once they are reviewed by [place]. If we ordered any referrals today, please let us know if you have not heard from their office within the next week.   Please try these tips to maintain a healthy lifestyle:   Eat at least 3 REAL meals and 1-2 snacks per day.  Aim for no more than 5 hours between eating.  If you eat breakfast, please do so within one hour of getting up.    Each meal should contain half fruits/vegetables, one quarter protein, and one quarter carbs (no bigger than a computer mouse)   Cut down on sweet beverages. This includes juice, soda, and sweet tea.     Drink at least 1 glass of water with each meal and aim for at least 8 glasses per day   Exercise at least 150 minutes every week.

## 2020-10-22 NOTE — Assessment & Plan Note (Signed)
At goal on metoprolol tartrate 50 mg twice daily and Benicar 5 mg once daily.

## 2020-10-22 NOTE — Progress Notes (Signed)
   Traci Harper is a 65 y.o. female who presents today for an office visit.  Assessment/Plan:  New/Acute Problems: Motion Sickness Patient request: Patches.  We will send in today.  She has upcoming cruise.  Discussed potential side effects.  Chronic Problems Addressed Today: Hypothyroidism She is doing well with Synthroid 150 mcg daily.  He can check TSH next blood draw.  Type 2 diabetes mellitus with hyperglycemia (HCC) A1c 6.6.  She is on Trulicity 1.5 mg weekly and Janumet 50-1000 twice daily.  Discussed potentially switching Janumet to metformin alone however she has upcoming insurance change and we will defer making any changes today.  We can recheck A1c in 6 months and can rediscuss switching her Janumet at that point.  Hypertension associated with diabetes (HCC) At goal on metoprolol tartrate 50 mg twice daily and Benicar 5 mg once daily.     Subjective:  HPI:  See a/p.         Objective:  Physical Exam: BP 131/82   Pulse 85   Temp (!) 97.2 F (36.2 C) (Temporal)   Ht 5\' 3"  (1.6 m)   Wt 227 lb 6.4 oz (103.1 kg)   SpO2 98%   BMI 40.28 kg/m   Gen: No acute distress, resting comfortably CV: Regular rate and rhythm with no murmurs appreciated Pulm: Normal work of breathing, clear to auscultation bilaterally with no crackles, wheezes, or rhonchi Neuro: Grossly normal, moves all extremities Psych: Normal affect and thought content      Nichoals Heyde M. , MD 10/22/2020 10:22 AM

## 2020-10-30 DIAGNOSIS — D849 Immunodeficiency, unspecified: Secondary | ICD-10-CM | POA: Diagnosis not present

## 2020-10-30 DIAGNOSIS — Z944 Liver transplant status: Secondary | ICD-10-CM | POA: Diagnosis not present

## 2020-10-31 DIAGNOSIS — D849 Immunodeficiency, unspecified: Secondary | ICD-10-CM | POA: Diagnosis not present

## 2020-10-31 DIAGNOSIS — K432 Incisional hernia without obstruction or gangrene: Secondary | ICD-10-CM | POA: Diagnosis not present

## 2020-10-31 DIAGNOSIS — D899 Disorder involving the immune mechanism, unspecified: Secondary | ICD-10-CM | POA: Diagnosis not present

## 2020-10-31 DIAGNOSIS — R109 Unspecified abdominal pain: Secondary | ICD-10-CM | POA: Diagnosis not present

## 2020-10-31 DIAGNOSIS — Z6836 Body mass index (BMI) 36.0-36.9, adult: Secondary | ICD-10-CM | POA: Diagnosis not present

## 2020-10-31 DIAGNOSIS — K439 Ventral hernia without obstruction or gangrene: Secondary | ICD-10-CM | POA: Diagnosis not present

## 2020-10-31 DIAGNOSIS — Z944 Liver transplant status: Secondary | ICD-10-CM | POA: Diagnosis not present

## 2021-01-04 ENCOUNTER — Telehealth: Payer: Self-pay

## 2021-01-04 NOTE — Telephone Encounter (Signed)
.   Encourage patient to contact the pharmacy for refills or they can request refills through [facility]  LAST APPOINTMENT DATE:  Please schedule appointment if longer than 1 year  NEXT APPOINTMENT DATE:  MEDICATION:levothyroxine (SYNTHROID) 150 MCG tablet  omeprazole (PRILOSEC) 20 MG capsule  JANUMET 1000MG    Is the patient out of medication?   PHARMACY: [facility] [address] [place]  [phone]

## 2021-01-04 NOTE — Telephone Encounter (Signed)
Dr. [name], pt requesting refills on the following. See message. Pt is requesting Janumet. Please advise directions and dose?

## 2021-01-07 MED ORDER — OMEPRAZOLE 20 MG PO CPDR: 20.0000 mg | DELAYED_RELEASE_CAPSULE | Freq: Every day | ORAL | 3 refills | Status: DC

## 2021-01-07 MED ORDER — LEVOTHYROXINE SODIUM 150 MCG PO TABS: 150.0000 ug | ORAL_TABLET | Freq: Every day | ORAL | 3 refills | Status: DC

## 2021-01-07 MED ORDER — ONETOUCH VERIO FLEX SYSTEM W/DEVICE KIT: PACK | 0 refills | Status: DC

## 2021-01-07 MED ORDER — JANUMET 50-1000 MG PO TABS: 1.0000 | ORAL_TABLET | Freq: Two times a day (BID) | ORAL | 0 refills | Status: DC

## 2021-01-07 NOTE — Addendum Note (Signed)
Addended by: Jimmye Norman on: 01/07/2021 02:24 PM   Modules accepted: Orders

## 2021-01-07 NOTE — Telephone Encounter (Addendum)
Spoke to pt told her need to know if she is taking Janumet or if she wants to go back on Metformin? Pt said she is taking Janumet. Asked her if she needs refill? Pt said yes and also need new meter mine broke. Told pt okay will send Rx's to pharmacy. Pt verbalized understanding. Rx's sent.

## 2021-01-07 NOTE — Telephone Encounter (Signed)
I beleve she was on janumet 50-1000 twice daily and we were going to change to metformin 1000mg  twice daily.  Can we clarify with patient?  I will send in her other medications.  . Katina Degree, MD 01/07/2021 10:45 AM

## 2021-01-08 NOTE — Telephone Encounter (Signed)
Patient is calling in asking the prescriptions can be sent to [facility] [address] [place]   [phone] [facility] not [facility] // also [facility].

## 2021-01-09 ENCOUNTER — Other Ambulatory Visit: Payer: Self-pay | Admitting: Family Medicine

## 2021-01-09 MED ORDER — JANUMET 50-1000 MG PO TABS: 1.0000 | ORAL_TABLET | Freq: Two times a day (BID) | ORAL | 0 refills | Status: DC

## 2021-01-09 MED ORDER — ONETOUCH VERIO FLEX SYSTEM W/DEVICE KIT: PACK | 0 refills | Status: DC

## 2021-01-09 NOTE — Telephone Encounter (Signed)
Dr. [name], please see message from pharmacy about Rx.

## 2021-01-09 NOTE — Telephone Encounter (Signed)
Rxs have been re ordered and sent to [facility].

## 2021-01-09 NOTE — Addendum Note (Signed)
Addended by: Lieutenant Diego A on: 01/09/2021 01:39 PM   Modules accepted: Orders

## 2021-01-10 ENCOUNTER — Telehealth: Payer: Self-pay

## 2021-01-10 MED ORDER — OMEPRAZOLE 20 MG PO CPDR: 20.0000 mg | DELAYED_RELEASE_CAPSULE | Freq: Every day | ORAL | 3 refills | Status: DC

## 2021-01-10 MED ORDER — LEVOTHYROXINE SODIUM 150 MCG PO TABS: 150.0000 ug | ORAL_TABLET | Freq: Every day | ORAL | 3 refills | Status: DC

## 2021-01-10 NOTE — Telephone Encounter (Signed)
Refills sent

## 2021-01-10 NOTE — Telephone Encounter (Signed)
LAST APPOINTMENT DATE:  10/22/20  NEXT APPOINTMENT DATE:05/01/21  MEDICATION:omeprazole (PRILOSEC) 20 MG capsule  levothyroxine (SYNTHROID) 150 MCG tablet  PHARMACY:[facility] [address] [place], [place] - [address]

## 2021-01-11 ENCOUNTER — Other Ambulatory Visit: Payer: Self-pay | Admitting: *Deleted

## 2021-01-11 ENCOUNTER — Telehealth: Payer: Self-pay | Admitting: *Deleted

## 2021-01-11 MED ORDER — SITAGLIPTIN PHOSPHATE 100 MG PO TABS: 100.0000 mg | ORAL_TABLET | Freq: Every day | ORAL | 0 refills | Status: DC

## 2021-01-11 MED ORDER — SAXAGLIPTIN HCL 5 MG PO TABS
5.0000 mg | ORAL_TABLET | Freq: Every day | ORAL | 1 refills | Status: DC
Start: 2021-01-11 — End: 2021-04-02

## 2021-01-11 MED ORDER — METFORMIN HCL 1000 MG PO TABS: 1000.0000 mg | ORAL_TABLET | Freq: Two times a day (BID) | ORAL | 3 refills | Status: DC

## 2021-01-11 NOTE — Telephone Encounter (Signed)
PA Rx was Cancelled today NON MBR  Per Jarold Motto, PA  Ok to change Rx to  Metformin 1000mg  BID and Sitagliptin 100mg  Daily   Left voice message to patient with information

## 2021-01-11 NOTE — Telephone Encounter (Signed)
PA Key: [id number] help [facility] to [facility] Janumet 50-1000MG  tablets Waiting for determination

## 2021-01-14 ENCOUNTER — Telehealth: Payer: Self-pay

## 2021-01-14 MED ORDER — JENTADUETO XR 5-1000 MG PO TB24: 1.0000 | ORAL_TABLET | Freq: Two times a day (BID) | ORAL | 3 refills | Status: DC

## 2021-01-14 NOTE — Telephone Encounter (Signed)
[facility] pharmacy is calling in stating the prescription linaGLIPtin-metFORMIN HCl ER (JENTADUETO XR) 09-998 MG TB24 insurance does not want to pay for the medication due to it being twice a day.

## 2021-01-14 NOTE — Telephone Encounter (Signed)
Please advise 

## 2021-01-15 ENCOUNTER — Other Ambulatory Visit: Payer: Self-pay | Admitting: Family Medicine

## 2021-01-15 NOTE — Telephone Encounter (Signed)
Insurance will only cover 1 tab daily. Ok to change instructions?

## 2021-01-15 NOTE — Telephone Encounter (Signed)
New rx has been submitted.

## 2021-01-15 NOTE — Telephone Encounter (Signed)
[facility]  Advanced Heart Failure and Transplant  Heart Transplant Clinic  Follow-up Visit    Primary Care Physician: Brodsky, Mark E  Referring Provider: Brett Justin Berman  Date of Transplant: 07/22/2019  Organ(s) Transplanted: heart  Indication for transplant: Dilated Myopathy: Idiopathic  PHS increased risk donor: Yes    ID. 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    Interval History:    The patient was last seen on 09/16/21. At that time issues with pain after urologic procedure.    He continues to deal with pain issue largely from prostate surgery. He still has some bleeding and some tissue come out. He tried different strategies and nothing helped. This is really impacting quality of life. He gets tired and frustrated and does not want to take it out on his family.    ROS:  A complete ROS was performed and is negative except as documented in the HPI.      Allergies:  Patient is allergic to cats [other] and dogs [other].    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    Pericardial effusion    Hypertension    Chronic back pain    At risk for infection transmitted from donor    Acute hepatitis C virus infection    Heart transplanted (CMS-HCC)    Acute UTI    Umbilical hernia without obstruction and without gangrene    COVID-19 virus detected    Acute medial meniscus tear of left knee, sequela    Localized osteoarthritis of left knee     Past Surgical History:   Procedure Laterality Date    CARDIAC DEFIBRILLATOR PLACEMENT       PB ANESTH,SHOULDER JOINT,NOS Right      Family History   Problem Relation Name Age of Onset    Hypertension Other      Other Maternal Grandmother          kidney disease needing HD     Social History     Socioeconomic History    Marital status: Single     Spouse name: Not on file    Number of children: Not on file    Years of education: Not on file    Highest education level: Not on file   Occupational History    Not on file   Tobacco Use    Smoking status: Never    Smokeless tobacco: Never    Tobacco comments:     from friends and relatives    Substance and Sexual Activity    Alcohol use: Not Currently     Comment: Prior heavier use, but completely quit in 2016    Drug use: Yes     Comment: eats edible marijuana for pain and insomnia     Sexual activity: Not on file   Other Topics Concern    Not on file   Social History Narrative      Born in [place], also lived in [place], [place], [place], no travel, worked as a carpenter, occasional cedar, no birds, no hot tubs, worked in construction + possible asbestos exposure      Social Determinants of Health     Financial Resource Strain: Not on file   Food Insecurity: Not on file   Transportation Needs: Not on file   Physical Activity: Not on file   Stress: Not on file   Social Connections: Not on file   Intimate Partner Violence: Not on file   Housing Stability: Not on file     Current Outpatient Medications   Medication Sig    albuterol 108 (90 Base) MCG/ACT inhaler Inhale 2 puffs by mouth every 4 hours as needed for Wheezing or Shortness of Breath.    aspirin 81 MG EC tablet Take 1 tablet (81 mg) by mouth daily.    baclofen (LIORESAL) 10 MG tablet Take 2 tablets (20 mg) by mouth nightly.    Blood Glucose Monitoring Suppl (TRUE METRIX METER) w/Device KIT Use as directed    budesonide-formoterol (SYMBICORT) 160-4.5 MCG/ACT inhaler Inhale 2 puffs by mouth every 12 hours.    bumetanide (BUMEX) 1 MG tablet Take 1 tablet (1 mg) by mouth daily as needed (fluid/weight  gain). Do not take unless instructed by Transplant team.    Calcium Carb-Cholecalciferol 600-10 MG-MCG TABS Take 1 tablet by mouth 2 times daily.    Cetirizine HCl (ZERVIATE) 0.24 % SOLN Place 1 drop into both eyes 2 times daily.    clindamycin (CLEOCIN T) 1 % solution Apply 1 Application. topically 2 times daily. Apply to the red bumps on your face up to two times a day.    controlled substance agreement controlled substance agreement    diclofenac (VOLTAREN) 1 % gel Apply 2 g topically 4 times daily.    docusate sodium (COLACE) 100 MG capsule Take 1 capsule (100 mg) by mouth 2 times daily.    DULoxetine (CYMBALTA) 30 MG CR capsule Take 1 capsule (30 mg) by mouth daily.    famotidine (PEPCID) 20 MG tablet Take 1 tablet (20 mg) by mouth 2 times daily.    fluticasone propionate (FLONASE) 50 MCG/ACT nasal spray Spray 1 spray into each nostril 2 times daily.    gabapentin (NEURONTIN) 300 MG capsule Take 1 capsule (300 mg) by mouth every morning AND 1 capsule (300 mg) daily AND 2 capsules (600 mg) every evening.    hydroCHLOROthiazide (HYDRODIURIL) 25 MG tablet Take 1 tablet (25 mg) by mouth daily.    ketoconazole (NIZORAL) 2 % shampoo Use shampoo daily for dandruff    lidocaine (LIDOCAINE PAIN RELIEF) 4 % patch Apply 1 patch topically every 24 hours. Leave patch on for 12 hours, then remove for 12 hours.    lisinopril (PRINIVIL, ZESTRIL) 10 MG tablet Take 2 tablets (20 mg) by mouth daily.    magnesium oxide (MAG-OX) 400 MG tablet Take 1 tablet by mouth daily    melatonin (GNP MELATONIN MAXIMUM STRENGTH) 5 MG tablet Take 2 tablets (10 mg) by mouth at bedtime.    Multiple Vitamin (MULTIVITAMIN) TABS tablet Take 1 tablet by mouth daily.    naloxone (KLOXXADO) 8 mg/0.1 mL nasal spray Call 911! Tilt head and spray intranasally into one nostril as needed for respiratory depression. If patient does not respond or responds and then relapses, repeat using a new nasal spray every 3 minutes until emergency medical assistance  arrives.    NEEDLE, DISP, 25 G 25G X   1" MISC Use to inject testosterone    NIFEdipine (ADALAT CC) 30 MG Controlled-Release tablet Take 1 tablet (30 mg) by mouth nightly.    ondansetron (ZOFRAN) 8 MG tablet Take 1 tablet (8 mg) by mouth every 8 hours as needed for Nausea/Vomiting.    oxyCODONE (ROXICODONE) 10 MG tablet Take 1 tab every 4 hours as needed for moderate pain and 2 tabs every 4 hours as needed for severe pain. Max 10 tabs per day, 28 day supply    phenazopyridine (PYRIDIUM) 100 MG tablet Take 1 tablet (100 mg) by mouth 3 times daily.    polyethylene glycol (GLYCOLAX) 17 GM/SCOOP powder Mix 17 grams in 4-8 oz of liquide and drink by mouth daily as needed (Constipation).    pravastatin (PRAVACHOL) 40 MG tablet Take 1 tablet (40 mg) by mouth every evening.    senna (SENOKOT) 8.6 MG tablet Take 1 tablet (8.6 mg) by mouth daily.    sirolimus (RAPAMUNE) 1 MG tablet Take 2 tablets (2 mg) by mouth every morning.    SYRINGE-NEEDLE, DISP, 3 ML (B-D 3CC LUER-LOK SYR 25GX1") 25G X 1" 3 ML MISC Use as directed to inject testosterone    SYRINGE-NEEDLE, DISP, 3 ML 18G X 1-1/2" 3 ML MISC Use to draw up testosterone    tacrolimus (ENVARSUS XR) 1 MG tablet STOP TAKING since 02/12/22 - remaining on chart for dose adjustments, titratable med.    tacrolimus (ENVARSUS XR) 4 MG tablet Take 1 tablet (4 mg) by mouth every morning.    tamsulosin (FLOMAX) 0.4 MG capsule Take 1 capsule (0.4 mg) by mouth daily.    tamsulosin (FLOMAX) 0.4 MG capsule Take 1 capsule (0.4 mg) by mouth daily.    testosterone cypionate (DEPO-TESTOSTERONE) 200 MG/ML SOLN Inject 1 ml into the muscle every 14 days    traZODone (DESYREL) 50 MG tablet Take 1 tablet (50 mg) by mouth nightly.     Current Facility-Administered Medications   Medication    diphenhydrAMINE (BENADRYL) injection 50 mg    diphenhydrAMINE (BENADRYL) tablet 50 mg     Immunization History   Administered Date(s) Administered    COVID-19 (Moderna) Low Dose Red Cap >= 18 Years 07/13/2020     COVID-19 (Moderna) Red Cap >= 12 Years 08/20/2019, 09/19/2019, 01/18/2020    Hep-A/Hep-B; Twinrix, Adult 08/13/2020    Influenza Vaccine (High Dose) Quadrivalent >=65 Years 04/04/2020    Influenza Vaccine (Unspecified) 01/31/2017    Influenza Vaccine >=6 Months 04/15/2010, 04/15/2011, 06/10/2012, 03/08/2014, 02/19/2018, 03/07/2019    Pneumococcal 13 Vaccine (PREVNAR-13) 04/04/2020    Pneumococcal 23 Vaccine (PNEUMOVAX-23) 04/15/2013, 08/13/2020    Tdap 06/03/2011   Deferred Date(s) Deferred    Pneumococcal 23 Vaccine (PNEUMOVAX-23) 08/04/2019     Physical Exam:  BP 102/69 (BP Location: Right arm, BP Patient Position: Sitting, BP cuff size: Large)   Pulse 98   Temp 98.5 F (36.9 C) (Temporal)   Resp 16   Ht 5' 10" (1.778 m)   Wt 96.2 kg (212 lb)   SpO2 97%   BMI 30.42 kg/m      General Appearance: ***alert, no distress, pleasant affect, cooperative.  Heart:  JVD ***, PMI ***, normal rate and regular rhythm, no murmurs, clicks, or gallops. ***  Lungs: ***clear to auscultation and percussion. No rales, rhonchi, or wheezes noted. No chest deformities noted.  Abdomen: ***BS normal.  Abdomen soft, non-tender.  No masses or organomegaly.  Extremities:  ***no cyanosis, clubbing, or edema. Has 2+ peripheral pulses.        Lab Data:  Lab Results   Component Value Date    BUN 26 (H) 02/12/2022    CREAT 1.98 (H) 02/12/2022    CL 99 02/12/2022    NA 140 02/12/2022    K 4.4 02/12/2022    CA 9.2 02/12/2022    TBILI 0.47 02/12/2022    ALB 4.1 02/12/2022    TP 7.1 02/12/2022    AST 22 02/12/2022    ALK 76 02/12/2022    BICARB 29 02/12/2022    ALT 25 02/12/2022    GLU 126 (H) 02/12/2022     Lab Results   Component Value Date    WBC 7.9 02/12/2022    RBC 5.50 02/12/2022    HGB 15.2 02/12/2022    HCT 46.5 02/12/2022    MCV 84.5 02/12/2022    MCHC 32.7 02/12/2022    RDW 12.3 02/12/2022    PLT 162 02/12/2022    MPV 11.6 02/12/2022     Lab Results   Component Value Date    A1C 5.7 07/12/2021     Lab Results   Component Value Date     TSH 1.63 07/12/2021     Lab Results   Component Value Date    CHOL 105 07/12/2021    HDL 38 07/12/2021    LDLCALC 43 07/12/2021    TRIG 121 07/12/2021     Lab Results   Component Value Date    SIROT 11.5 02/12/2022     Lab Results   Component Value Date    FKTR 6.5 02/12/2022     No results found for: CSATR  Lab Results   Component Value Date    CMVPL Not Detected 05/03/2021     Lab Results   Component Value Date    DSA ABSENT 02/12/2022       Prior Cardiovascular Studies:   Lab Results   Component Value Date    LV Ejection Fraction 59 08/08/2021          Echo 08/08/21  Summary:   1. The left ventricular size is normal. The left ventricular systolic function is normal.   2. No left ventricular hypertrophy.   3. Normal pattern of left ventricular diastolic filling.   4. EF=59%.   5. Compared to prior study EF now 59%, was 69% 08/31/20.     LHC/IVUS 08/06/21  CONCLUSION:                                                                   1. 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                              2. No angiographic evidence of coronary artery disease.                      3. Intimal thickness noted in LAD/LM up to 0.5 mm (Stable to slightly       worse compare to 2022).                                                         4. Non significant FFR at apical LAD.                                        5. Left ventricular end diastolic pressure appears normal.        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    Assessment/Plan:  # Hematuria  # Dysuria  # Chronic pain  Assessment: We had a long frank discussion about patient's chronic pain issues and the heart transplant team's role in this. I discussed with him that when I initially agreed to cover his chronic opiate prescription, this was the assumption that he would  have a provider versed in chronic pain after 3-4 months, but we are at 6 months and has unable to find one. Additionally, I had not put him on a pain contract at that time, but he recently used more opiates without asking and I informed him this was not appropriate, but because he had not established guidelines I was not going to stop at this time. However, going forward until he can establish with a pain physician, we will set up a pain contract and he will need to follow through like a usual pain clinic with us with goal of provider in 3-4 months or I may start tapering. I will augment adjuvant agents additionally for now and we can continue to work on this.  Plan:  -pain contract signed  -urine tox monthly  -clinic follow up month  -oxycodone 10 mg tablets PO, 1 tab every 4 hours moderate pain, 2 tabs every 4 hours for severe pain, no more than 10 tablets a day, total 280 per 28 days.   -diclofenac cream for joint pain  -lidocaine patch for back pain  -trial of pyridium  -increase gaba at night  -siro change as below  -cymbalta as below    # End-stage heart failure s/p orthotopic heart transplant  # Chronic Immunosuppression/Immunomodulation  Assessment: While we thought continuing sirolimus would help prevent recurrent scar tissue from prostate procedure, it may be exacerbating factors now with delayed wound healing. Will try mmf for 1 month.  Plan:   - continue envarsus 6 mg daily, goal trough 4-8  - HOLD sirolimus 3 mg daily, goal trough 4-8 for at least 1 month  - start mmf 1000 mg bid for one month to allow healing  - Continue to monitor for renal toxicities, infection risk and malignancy risk  - continue pravastatin 40 mg daily  - continue aspirin 81 mg daily    # Hypertension  Assessment: controlled  Plan:  -continue lisinopril 20 mg daily  -resume hctz  -nifedipine 30 mg daily    # Dyslipidemia  -continue pravastatin 40 mg daily    # Depression  Assessment: improved mood  Plan:  -increase cymbalta to 120  mg daily     RTC in 1 month       Nicholas W Wettersten, MD  Advanced Heart Failure, Mechanical Circulatory Support, Transplant  Pgr: [id number]

## 2021-01-15 NOTE — Telephone Encounter (Signed)
Yes. Please see phone note. Ok to change rx to say take 2 tablets once daily.  Katina Degree. [name], MD 01/15/2021 11:31 AM

## 2021-01-15 NOTE — Telephone Encounter (Signed)
WE can resend rx as 2 tablets once daily if that will help it be approvied.  Traci Harper. [name], MD 01/15/2021 8:10 AM

## 2021-01-23 ENCOUNTER — Telehealth: Payer: Self-pay

## 2021-01-23 ENCOUNTER — Other Ambulatory Visit: Payer: Self-pay | Admitting: *Deleted

## 2021-01-23 ENCOUNTER — Telehealth: Payer: Self-pay | Admitting: *Deleted

## 2021-01-23 MED ORDER — GLUCOSE BLOOD VI STRP: 1.0000 | ORAL_STRIP | 1 refills | Status: AC | PRN

## 2021-01-23 NOTE — Telephone Encounter (Signed)
Key: [facility] Case ID: [id number] - Rx #: [id number] Need help? Call us at [phone] Status Sent to [facility] Trulicity 1.5MG /0.5ML pen-injectors Waiting for determination

## 2021-01-23 NOTE — Telephone Encounter (Signed)
Approvedtoday Request Reference Number: [id number]. TRULICITY INJ 1.5/0.5 is approved through 01/23/2022 Pharmacy Notified

## 2021-01-23 NOTE — Telephone Encounter (Signed)
LAST APPOINTMENT DATE:  10/22/20  NEXT APPOINTMENT DATE: 05/01/21  MEDICATION:Blood Glucose Monitoring Suppl ([place]) w/Device KIT  PHARMACY:[facility] [address] [place], [place] - [address]

## 2021-01-23 NOTE — Telephone Encounter (Signed)
Strip send to pharmacy

## 2021-01-27 ENCOUNTER — Other Ambulatory Visit: Payer: Self-pay | Admitting: Family Medicine

## 2021-03-20 ENCOUNTER — Telehealth: Payer: Self-pay

## 2021-03-20 MED ORDER — MAGNESIUM OXIDE 400 MG PO TABS: 1.0000 | ORAL_TABLET | Freq: Two times a day (BID) | ORAL | 3 refills | Status: DC

## 2021-03-20 MED ORDER — TRULICITY 1.5 MG/0.5ML ~~LOC~~ SOAJ: 1.5000 mg | SUBCUTANEOUS | 3 refills | Status: DC

## 2021-03-20 NOTE — Telephone Encounter (Signed)
Medications refilled

## 2021-03-20 NOTE — Telephone Encounter (Signed)
  Encourage patient to contact the pharmacy for refills or they can request refills through [facility]  LAST APPOINTMENT DATE:  10/22/2020  NEXT APPOINTMENT DATE:05/01/2021  MEDICATION:magnesium oxide (MAG-OX) 400 MG tablet // Dulaglutide (TRULICITY) 1.5 MG/0.5ML SOPN  Is the patient out of medication? No   PHARMACY:[facility] [address] [place], [place] - [address]  Let patient know to contact pharmacy at the end of the day to make sure medication is ready.  Please notify patient to allow 48-72 hours to process

## 2021-04-02 ENCOUNTER — Ambulatory Visit: Payer: No Typology Code available for payment source | Admitting: Family Medicine

## 2021-04-02 ENCOUNTER — Other Ambulatory Visit: Payer: Self-pay

## 2021-04-02 ENCOUNTER — Encounter: Payer: Self-pay | Admitting: Family Medicine

## 2021-04-02 VITALS — BP 145/87 | HR 77 | Temp 97.9°F | Ht 63.0 in | Wt 237.0 lb

## 2021-04-02 DIAGNOSIS — E1165 Type 2 diabetes mellitus with hyperglycemia: Secondary | ICD-10-CM

## 2021-04-02 DIAGNOSIS — I2699 Other pulmonary embolism without acute cor pulmonale: Secondary | ICD-10-CM | POA: Insufficient documentation

## 2021-04-02 DIAGNOSIS — Z944 Liver transplant status: Secondary | ICD-10-CM

## 2021-04-02 LAB — POCT GLYCOSYLATED HEMOGLOBIN (HGB A1C): Hemoglobin A1C: 6.6 % — AB (ref 4.0–5.6)

## 2021-04-02 MED ORDER — JENTADUETO XR 5-1000 MG PO TB24: ORAL_TABLET | ORAL | 3 refills | Status: DC

## 2021-04-02 MED ORDER — ELIQUIS 5 MG PO TABS: 5.0000 mg | ORAL_TABLET | Freq: Two times a day (BID) | ORAL | 5 refills | Status: DC

## 2021-04-02 NOTE — Patient Instructions (Signed)
It was very nice to see you today!  Your A1c is 6.6.  I will send your medications into the pharmacy.  We should continue your blood thinner indefinitely.  We will see you back in 6 months for your annual physical with labs.  Please come back to see me sooner if needed.  Take care, Dr [name]  PLEASE NOTE:  If you had any lab tests please let us know if you have not heard back within a few days. You may see your results on mychart before we have a chance to review them but we will give you a call once they are reviewed by [place]. If we ordered any referrals today, please let us know if you have not heard from their office within the next week.   Please try these tips to maintain a healthy lifestyle:  Eat at least 3 REAL meals and 1-2 snacks per day.  Aim for no more than 5 hours between eating.  If you eat breakfast, please do so within one hour of getting up.   Each meal should contain half fruits/vegetables, one quarter protein, and one quarter carbs (no bigger than a computer mouse)  Cut down on sweet beverages. This includes juice, soda, and sweet tea.   Drink at least 1 glass of water with each meal and aim for at least 8 glasses per day  Exercise at least 150 minutes every week.

## 2021-04-02 NOTE — Assessment & Plan Note (Signed)
She is on Eliquis 5 mg twice daily.  Will refill today.  Discussed duration of anticoagulation.  Given her lack of provoking factor in addition to history of liver transplant, would be reasonable to continue with indefinite anticoagulation at this point.  We discussed potential risk including risk of bleeding.  She was agreeable to continue with anticoagulation indefinitely.  Had labs done last week at [facility] which were within normal limits.

## 2021-04-02 NOTE — Progress Notes (Signed)
Chief Complaint:  Traci Harper is a 65 y.o. female who presents today for a TCM visit.  Assessment/Plan:  Chronic Problems Addressed Today: Liver transplanted 2014 [facility]) [facility] Continue [facility] per [facility] team.  She is on Prograf and mycophenolate.  Pulmonary embolism ([place]) She is on Eliquis 5 mg twice daily.  Will refill today.  Discussed duration of anticoagulation.  Given her lack of provoking factor in addition to history of liver transplant, would be reasonable to continue with indefinite anticoagulation at this point.  We discussed potential risk including risk of bleeding.  She was agreeable to continue with anticoagulation indefinitely.  Had labs done last week at [facility] which were within normal limits.  Type 2 diabetes mellitus with hyperglycemia (HCC) A1c 6.6.  We will send in linagliptin-metformin 09-998 2 tablets daily to her pharmacy.  Continue Trulicity 1.5 mg weekly.  Recheck A1c in 6 months at CPE.  May be able to switch to metformin alone.    Subjective:  HPI:  Summary of Hospital admission: Reason for admission: Pulmonary embolism without cor pulmonale.  Date of admission: 03/26/2021 Date of discharge: 03/28/2021 Date of Interactive contact: 03/29/2021 Summary of Hospital course: She went to ED on 03/26/2021 with progressively shortness of breath. She was admitted at the request of her outpatient hepatologist who found bilateral PE. CT scan in the ED showed bilateral pulmonary emboli without right heart strain. She was admitted for anticoagulation. Workup in hospital included echocardiogram which was normal. Also found to have blood clots in both legs. Hematologist was consulted. She was started on heparin drip and then transition to Apixaban. Symptoms were improved and she was discharged home.  Interim history:  She admits to not having shortness of breath at this time. She was recently started on Apixaban and seems to be tolerating this well.  She has been walking occassionally. No reported leg swelling. She is following up with hematologist soon.   See a/p for status of chronic conditions   ROS: Per HPI, otherwise a complete review of systems was negative.   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 73   Thyroid disease    Patient Active Problem List   Diagnosis Date Noted   Pulmonary embolism ([place]) 04/02/2021   Hypertension associated with diabetes ([place]) 04/23/2020   Type 2 diabetes mellitus with hyperglycemia ([place]) 04/23/2020   Hypothyroidism 04/23/2020   GERD (gastroesophageal reflux disease) 04/23/2020   Liver transplanted 2014 [facility]) [facility] with [facility]   Past Surgical History:  Procedure Laterality Date   APPENDECTOMY     BACK SURGERY     BREAST BIOPSY Right    CESAREAN SECTION     x2   CHOLECYSTECTOMY     DILATION AND CURETTAGE OF UTERUS     at [place]     LIVER TRANSPLANT  09/17/2012   @[facility] [place]   THYROID SURGERY     x2   TUBAL LIGATION     V-TACH ABLATION      Family History  Problem Relation Age of Onset   Diabetes Mother    Heart disease Father    Diabetes Father     Medications- Reconciled discharge and current  medications in Epic.  Current Outpatient Medications  Medication Sig Dispense Refill   ammonium lactate (LAC-HYDRIN) 12 % lotion Apply topically 2 (two) times daily.     Ascorbic Acid (VITAMIN C) 100 MG tablet Take 100 mg by mouth daily.     Blood Glucose Monitoring Suppl (ONETOUCH VERIO FLEX SYSTEM) w/Device KIT USE   TO CHECK GLUCOSE FOR BLOOD SUGAR 1 kit 3   calcium citrate-vitamin D (CITRACAL+D) 315-200 MG-UNIT tablet Take 1 tablet by mouth 2 (two) times daily.     Dulaglutide (TRULICITY) 1.5 KD/9.8PJ SOPN Inject 1.5 mg into  the skin once a week. 6 mL 3   glucose blood test strip 1 each by Other route as needed for other. Use as instructed 100 each 1   levothyroxine (SYNTHROID) 150 MCG tablet Take 1 tablet (150 mcg total) by mouth daily. 90 tablet 3   magnesium oxide (MAG-OX) 400 MG tablet Take 1 tablet (400 mg total) by mouth 2 (two) times daily. 180 tablet 3   metoprolol tartrate (LOPRESSOR) 50 MG tablet TAKE 1 TABLET BY MOUTH TWICE A DAY 180 tablet 1   Multiple Vitamin (ONE DAILY) tablet Take 1 tablet by mouth daily.      mycophenolate (MYFORTIC) 360 MG TBEC Take 360 mg by mouth 2 (two) times daily.      olmesartan (BENICAR) 5 MG tablet TAKE 1 TABLET (5 MG TOTAL) BY MOUTH DAILY. 90 tablet 1   omeprazole (PRILOSEC) 20 MG capsule Take 1 capsule (20 mg total) by mouth daily. 90 capsule 3   PROGRAF 0.5 MG capsule Take 0.5 mg by mouth. Two tablets in the morning and 1 tablet in the evening  10   ascorbic acid (VITAMIN C) 1000 MG tablet Take 1 tablet by mouth daily.     ELIQUIS 5 MG TABS tablet Take 1 tablet (5 mg total) by mouth 2 (two) times daily. 60 tablet 5   linaGLIPtin-metFORMIN HCl ER (JENTADUETO XR) 09-998 MG TB24 Take 2 tablets once daily. 180 tablet 3   No current facility-administered medications for this visit.    Allergies-reviewed and updated Allergies  Allergen Reactions   Lisinopril Cough    Social History   Socioeconomic History   Marital status: Married    Spouse name: Not on file   Number of children: Not on file   Years of education: Not on file   Highest education level: Not on file  Occupational History   Not on file  Tobacco Use   Smoking status: Former    Types: Cigarettes    Quit date: 06/02/2002    Years since quitting: 18.8   Smokeless tobacco: Not on file  Substance and Sexual Activity   Alcohol use: No    Comment: No alcohol since 2012   Drug use: No   Sexual activity: Never  Other Topics Concern   Not on file  Social History Narrative   Not on file   Social  Determinants of Health   Financial Resource Strain: Not on file  Food Insecurity: Not on file  Transportation Needs: Not on file  Physical Activity: Not on file  Stress: Not on file  Social Connections: Not on file        Objective:  Physical Exam: BP (!) 145/87   Pulse 77   Temp 97.9 F (36.6 C) (Temporal)   Ht 5' 3"  (1.6 m)   Wt 237 lb (107.5 kg)   SpO2 96%   BMI 41.98 kg/m   Gen: NAD, resting comfortably  CV: RRR with no murmurs appreciated Pulm: NWOB, CTAB with no crackles, wheezes, or rhonchi GI: Normal bowel sounds present. Soft, Nontender, Nondistended. MSK: No edema, cyanosis, or clubbing noted Skin: Warm, dry Neuro: Grossly normal, moves all extremities Psych: Normal affect and thought content      I,Savera Zaman,acting as a scribe for Dimas Chyle, MD.,have documented all relevant documentation on the behalf of Dimas Chyle, MD,as directed by  Dimas Chyle, MD while in the presence of Dimas Chyle, MD.   I, Dimas Chyle, MD, have reviewed all documentation for this visit. The documentation on 04/02/21 for the exam, diagnosis, procedures, and orders are all accurate and complete.  Time Spent: 45 minutes of total time was spent on the date of the encounter performing the following actions: chart review prior to seeing the patient including records from her recent hospitalization, obtaining history, performing a medically necessary exam, counseling on the treatment plan, placing orders, and documenting in our EHR.   Algis Greenhouse. Jerline Pain, MD 04/02/2021 11:52 AM

## 2021-04-02 NOTE — Assessment & Plan Note (Signed)
Continue management per [facility] transplant team.  She is on Prograf and mycophenolate.

## 2021-04-02 NOTE — Assessment & Plan Note (Signed)
A1c 6.6.  We will send in linagliptin-metformin 09-998 2 tablets daily to her pharmacy.  Continue Trulicity 1.5 mg weekly.  Recheck A1c in 6 months at CPE.  May be able to switch to metformin alone.

## 2021-04-03 NOTE — Telephone Encounter (Signed)
This encounter was created in error - please disregard.

## 2021-04-09 ENCOUNTER — Telehealth: Payer: Self-pay | Admitting: *Deleted

## 2021-04-09 NOTE — Telephone Encounter (Signed)
KeyGaspar Bidding - PA Case ID: [id number] Status Sent to [facility] Jentadueto XR 5-1000MG  er tablets Waiting for determination

## 2021-04-11 ENCOUNTER — Telehealth: Payer: Self-pay | Admitting: *Deleted

## 2021-04-11 NOTE — Telephone Encounter (Signed)
  Deniedon November 8 Request Reference Number: [id number]. JENTADUETO TAB XR is denied for not meeting the prior authorization requirement(s).

## 2021-04-11 NOTE — Telephone Encounter (Signed)
Are there any alternatives? We can send in as two separate rx if needed.  Katina Degree. [name], MD 04/11/2021 3:14 PM

## 2021-04-11 NOTE — Telephone Encounter (Signed)
I am fine with the change but she needs to come in in 4 weeks or so to recheck TSH. Ok to place order.  Katina Degree. [name], MD 04/11/2021 3:13 PM

## 2021-04-11 NOTE — Telephone Encounter (Signed)
Pharmacy requesting changing generic Rx Levothyroxine Please advise

## 2021-04-12 ENCOUNTER — Telehealth: Payer: Self-pay

## 2021-04-12 ENCOUNTER — Other Ambulatory Visit: Payer: Self-pay

## 2021-04-12 DIAGNOSIS — E039 Hypothyroidism, unspecified: Secondary | ICD-10-CM

## 2021-04-12 NOTE — Telephone Encounter (Signed)
See note

## 2021-04-12 NOTE — Telephone Encounter (Signed)
Please schedule lab appointment for 1 month with the pt. Labs in.  Thanks!

## 2021-04-12 NOTE — Telephone Encounter (Signed)
Ok to change rx for her to take 2 pills once daily.  Traci Harper. [name], MD 04/12/2021 3:56 PM

## 2021-04-12 NOTE — Telephone Encounter (Signed)
Patient is calling in stating that she was prescribed linaGLIPtin-metFORMIN HCl ER (JENTADUETO XR) 09-998 MG TB24, and the instructions are written for twice daily. [facility] states that insurance will only cover once daily.

## 2021-04-12 NOTE — Telephone Encounter (Signed)
Patient is scheduled   

## 2021-04-15 ENCOUNTER — Other Ambulatory Visit: Payer: Self-pay | Admitting: *Deleted

## 2021-04-15 MED ORDER — METFORMIN HCL 1000 MG PO TABS: 1000.0000 mg | ORAL_TABLET | Freq: Two times a day (BID) | ORAL | 3 refills | Status: DC

## 2021-04-15 MED ORDER — LEVOTHYROXINE SODIUM 150 MCG PO TABS: 150.0000 ug | ORAL_TABLET | Freq: Every day | ORAL | 3 refills | Status: DC

## 2021-04-15 NOTE — Telephone Encounter (Signed)
Patient agreed with changes  Metformin send to [facility] pharmacy

## 2021-04-15 NOTE — Telephone Encounter (Signed)
Her A1c has been well controlled. We can switch her to metformin 1000mg  twice daily instead of her current rx. Please send this in if the patient is ok with it.  . Katina Degree, MD 04/15/2021 4:02 PM

## 2021-04-15 NOTE — Telephone Encounter (Signed)
Patient stated insurance will only pay for one per day  Please advise

## 2021-04-17 NOTE — Telephone Encounter (Signed)
Left message to return call to our office at their convenience.  

## 2021-04-19 NOTE — Telephone Encounter (Signed)
Patient will take Metformin Rx discontinued

## 2021-04-29 ENCOUNTER — Other Ambulatory Visit: Payer: Self-pay | Admitting: *Deleted

## 2021-04-29 ENCOUNTER — Telehealth: Payer: Self-pay

## 2021-04-29 MED ORDER — OLMESARTAN MEDOXOMIL 5 MG PO TABS: 5.0000 mg | ORAL_TABLET | Freq: Every day | ORAL | 1 refills | Status: DC

## 2021-04-29 NOTE — Telephone Encounter (Signed)
LAST APPOINTMENT DATE:  04/02/21  NEXT APPOINTMENT DATE: 10/01/21  MEDICATION:olmesartan (BENICAR) 5 MG tablet  PHARMACY: [facility] [address] [place], [place] - [address]

## 2021-04-29 NOTE — Telephone Encounter (Signed)
Rx send to the pharmacy  

## 2021-05-01 ENCOUNTER — Encounter: Payer: BC Managed Care – PPO | Admitting: Family Medicine

## 2021-05-09 ENCOUNTER — Other Ambulatory Visit: Payer: Self-pay

## 2021-05-09 ENCOUNTER — Encounter: Payer: Self-pay | Admitting: Emergency Medicine

## 2021-05-09 ENCOUNTER — Telehealth: Payer: Self-pay

## 2021-05-09 ENCOUNTER — Ambulatory Visit (HOSPITAL_BASED_OUTPATIENT_CLINIC_OR_DEPARTMENT_OTHER)
Admission: RE | Admit: 2021-05-09 | Discharge: 2021-05-09 | Disposition: A | Discharge status: Home / Self Care | Payer: No Typology Code available for payment source | Source: Ambulatory Visit | Attending: Emergency Medicine | Admitting: Emergency Medicine

## 2021-05-09 ENCOUNTER — Other Ambulatory Visit (INDEPENDENT_AMBULATORY_CARE_PROVIDER_SITE_OTHER): Payer: No Typology Code available for payment source

## 2021-05-09 ENCOUNTER — Ambulatory Visit
Admission: EM | Admit: 2021-05-09 | Discharge: 2021-05-09 | Disposition: A | Discharge status: Home / Self Care | Payer: No Typology Code available for payment source | Attending: Emergency Medicine | Admitting: Emergency Medicine

## 2021-05-09 DIAGNOSIS — J069 Acute upper respiratory infection, unspecified: Secondary | ICD-10-CM | POA: Insufficient documentation

## 2021-05-09 DIAGNOSIS — Z9189 Other specified personal risk factors, not elsewhere classified: Secondary | ICD-10-CM | POA: Insufficient documentation

## 2021-05-09 DIAGNOSIS — J989 Respiratory disorder, unspecified: Secondary | ICD-10-CM | POA: Diagnosis present

## 2021-05-09 DIAGNOSIS — J209 Acute bronchitis, unspecified: Secondary | ICD-10-CM | POA: Diagnosis not present

## 2021-05-09 DIAGNOSIS — Z944 Liver transplant status: Secondary | ICD-10-CM | POA: Insufficient documentation

## 2021-05-09 DIAGNOSIS — E039 Hypothyroidism, unspecified: Secondary | ICD-10-CM

## 2021-05-09 LAB — RESPIRATORY PANEL BY PCR

## 2021-05-09 LAB — TSH: TSH: 5.03 u[IU]/mL (ref 0.35–5.50)

## 2021-05-09 MED ORDER — AZITHROMYCIN 500 MG PO TABS: 500.0000 mg | ORAL_TABLET | Freq: Every day | ORAL | 0 refills | Status: AC

## 2021-05-09 MED ORDER — AMOXICILLIN-POT CLAVULANATE 875-125 MG PO TABS: 1.0000 | ORAL_TABLET | Freq: Two times a day (BID) | ORAL | 0 refills | Status: AC

## 2021-05-09 MED ORDER — OSELTAMIVIR PHOSPHATE 75 MG PO CAPS: 75.0000 mg | ORAL_CAPSULE | Freq: Two times a day (BID) | ORAL | 0 refills | Status: DC

## 2021-05-09 MED ORDER — BUDESONIDE-FORMOTEROL FUMARATE 160-4.5 MCG/ACT IN AERO: 2.0000 | INHALATION_SPRAY | Freq: Two times a day (BID) | RESPIRATORY_TRACT | 0 refills | Status: DC

## 2021-05-09 MED ORDER — PROMETHAZINE-DM 6.25-15 MG/5ML PO SYRP: 5.0000 mL | ORAL_SOLUTION | Freq: Four times a day (QID) | ORAL | 0 refills | Status: DC | PRN

## 2021-05-09 NOTE — Telephone Encounter (Signed)
Patient need OV, please schedule  

## 2021-05-09 NOTE — ED Notes (Signed)
Unsuccessful blood draw. Patient requests to go to [facility] for blood draw.

## 2021-05-09 NOTE — ED Provider Notes (Signed)
[facility]    CSN: [id number] Arrival date & time: 05/09/21  1131    HISTORY   Chief Complaint  Patient presents with   Cough   HPI Traci Harper is a 65 y.o. female. Patient presents to [facility] for evaluation fo 2 days of cough, nasal congestion, and chest pain while coughing.  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  Patient states she currently has multiple hernias and coughing causes increased pain.  The history is provided by the patient.  Past Medical History:  Diagnosis Date   Diabetes mellitus    Hepatic encephalopathy    Hypertension    Hypothyroidism    Kidney insufficiency    Molar pregnancy    at age 46   Thyroid disease    Patient Active Problem List   Diagnosis Date Noted   Pulmonary embolism ([place]) 04/02/2021   Hypertension associated with diabetes ([place]) 04/23/2020   Type 2 diabetes mellitus with hyperglycemia ([place]) 04/23/2020   Hypothyroidism 04/23/2020   GERD (gastroesophageal reflux disease) 04/23/2020   Liver transplanted 2014 [facility]) [facility] with [facility]   Past Surgical History:  Procedure Laterality Date   APPENDECTOMY     BACK SURGERY     BREAST BIOPSY Right    CESAREAN SECTION     x2   CHOLECYSTECTOMY     DILATION AND CURETTAGE OF UTERUS     at [place]     LIVER TRANSPLANT  09/17/2012   [facility] [place]   THYROID SURGERY     x2   TUBAL LIGATION     V-TACH ABLATION     OB History   No obstetric history on file.    Home Medications    Prior to Admission medications   Medication Sig Start Date End Date Taking? Authorizing Provider  Ascorbic Acid (VITAMIN C) 100 MG tablet Take 100 mg by mouth daily.    [provider]  ascorbic acid (VITAMIN C) 1000 MG tablet Take 1 tablet by mouth daily.    [provider]  Blood Glucose Monitoring Suppl (ONETOUCH VERIO FLEX SYSTEM) w/Device KIT USE   TO CHECK  GLUCOSE FOR BLOOD SUGAR 01/28/21   Vivi Barrack, MD  calcium citrate-vitamin D (CITRACAL+D) 315-200 MG-UNIT tablet Take 1 tablet by mouth 2 (two) times daily.    [provider]  Dulaglutide (TRULICITY) 1.5 IO/2.7OJ SOPN Inject 1.5 mg into the skin once a week. 03/20/21   Vivi Barrack, MD  ELIQUIS 5 MG TABS tablet Take 1 tablet (5 mg total) by mouth 2 (two) times daily. 04/02/21   Vivi Barrack, MD  glucose blood test strip 1 each by Other route as needed for other. Use as instructed 01/23/21   Vivi Barrack, MD  levothyroxine (SYNTHROID) 150 MCG tablet Take 1 tablet (150 mcg total) by mouth daily. 04/15/21   Vivi Barrack, MD  magnesium oxide (MAG-OX) 400 MG tablet Take 1 tablet (400 mg total) by mouth 2 (two) times daily. 03/20/21   Vivi Barrack, MD  metFORMIN (GLUCOPHAGE) 1000 MG tablet Take 1 tablet (1,000 mg total) by mouth 2 (two) times daily with a meal. 04/15/21   Vivi Barrack, MD  metoprolol tartrate (LOPRESSOR) 50 MG tablet TAKE 1 TABLET BY MOUTH TWICE A DAY 10/15/20   Vivi Barrack, MD  Multiple Vitamin (ONE DAILY) tablet Take 1  tablet by mouth daily.     [provider]  mycophenolate (MYFORTIC) 360 MG TBEC Take 360 mg by mouth 2 (two) times daily.     [provider]  olmesartan (BENICAR) 5 MG tablet Take 1 tablet (5 mg total) by mouth daily. 04/29/21   Vivi Barrack, MD  omeprazole (PRILOSEC) 20 MG capsule Take 1 capsule (20 mg total) by mouth daily. 01/10/21   Vivi Barrack, MD  PROGRAF 0.5 MG capsule Take 0.5 mg by mouth. Two tablets in the morning and 1 tablet in the evening 04/16/15   [provider]   Family History Family History  Problem Relation Age of Onset   Diabetes Mother    Heart disease Father    Diabetes Father    Social History Social History   Tobacco Use   Smoking status: Former    Types: Cigarettes    Quit date: 06/02/2002    Years since quitting: 18.9  Substance Use Topics   Alcohol use: No     Comment: No alcohol since 2012   Drug use: No   Allergies   Lisinopril  Review of Systems Review of Systems Pertinent findings noted in history of present illness.   Physical Exam Triage Vital Signs ED Triage Vitals  Enc Vitals Group     BP 03/29/21 0827 (!) 147/82     Pulse Rate 03/29/21 0827 72     Resp 03/29/21 0827 18     Temp 03/29/21 0827 98.3 F (36.8 C)     Temp Source 03/29/21 0827 Oral     SpO2 03/29/21 0827 98 %     Weight --      Height --      Head Circumference --      Peak Flow --      Pain Score 03/29/21 0826 5     Pain Loc --      Pain Edu? --      Excl. in [place]? --   No data found.  Updated Vital Signs BP (!) 172/95 (BP Location: Right Arm)   Pulse 98   Temp 98.3 F (36.8 C) (Oral) Comment: Tylenol at 10am  Resp 18   SpO2 94%   Physical Exam  Visual Acuity Right Eye Distance:   Left Eye Distance:   Bilateral Distance:    Right Eye Near:   Left Eye Near:    Bilateral Near:     UC Couse / Diagnostics / Procedures:    EKG  Radiology No results found.  Procedures Procedures (including critical care time)  UC Diagnoses / Final Clinical Impressions(s)   I have reviewed the triage vital signs and the nursing notes.  Pertinent labs & imaging results that were available during my care of the patient were reviewed by me and considered in my medical decision making (see chart for details).   Final diagnoses:  Acute bronchitis, unspecified organism  At risk for infection due to immunosuppression  Respiratory illness  Liver transplanted 2014 [facility]) follows with [facility]   Given patient's immunocompromise status, recommend she began antiviral and antibiotics at this time for presumed viral or bacterial upper respiratory infection.  CBC with differential was performed, respiratory panel was obtained.  Chest x-ray ordered at the  [place] location.  Patient advised she will be notified of the results once received.  Adjustments to regimen  will be made as needed.  Patient advised to return for reevaluation in 2 to 3 days, advised she can go  to the [facility] location over the weekend.  ED Prescriptions     Medication Sig Dispense Auth. Provider   amoxicillin-clavulanate (AUGMENTIN) 875-125 MG tablet Take 1 tablet by mouth every 12 (twelve) hours for 5 days. 10 tablet Lynden Oxford Scales, PA-C   azithromycin (ZITHROMAX) 500 MG tablet Take 1 tablet (500 mg total) by mouth daily for 3 days. Take first 2 tablets together, then 1 every day until finished. 3 tablet Lynden Oxford Scales, PA-C   oseltamivir (TAMIFLU) 75 MG capsule Take 1 capsule (75 mg total) by mouth every 12 (twelve) hours. 10 capsule Lynden Oxford Scales, PA-C   budesonide-formoterol [facility]) 160-4.5 MCG/ACT inhaler Inhale 2 puffs into the lungs in the morning and at bedtime. 10.2 g Lynden Oxford Scales, PA-C   promethazine-dextromethorphan (PROMETHAZINE-DM) 6.25-15 MG/5ML syrup Take 5 mLs by mouth 4 (four) times daily as needed for cough. 180 mL Lynden Oxford Scales, PA-C      PDMP not reviewed this encounter.  Pending results:  Labs Reviewed  RESPIRATORY PANEL BY PCR  NOVEL CORONAVIRUS, NAA  CBC WITH DIFFERENTIAL/PLATELET    Medications Ordered in UC: Medications - No data to display  Disposition Upon Discharge:  Condition: stable for discharge home Home: take medications as prescribed; routine discharge instructions as discussed; follow up as advised.  Patient presented with an acute illness with associated systemic symptoms and significant discomfort requiring urgent management. In my opinion, this is a condition that a prudent lay person (someone who possesses an average knowledge of health and medicine) may potentially expect to result in complications if not addressed urgently such as respiratory distress, impairment of bodily function or dysfunction of bodily organs.   Routine symptom specific, illness specific and/or disease specific  instructions were discussed with the patient and/or caregiver at length.   As such, the patient has been evaluated and assessed, work-up was performed and treatment was provided in alignment with urgent care protocols and evidence based medicine.  Patient/parent/caregiver has been advised that the patient may require follow up for further testing and treatment if the symptoms continue in spite of treatment, as clinically indicated and appropriate.  The patient was tested for COVID-19, Influenza and/or RSV, then the patient/parent/guardian was advised to isolate at home pending the results of his/her diagnostic coronavirus test and potentially longer if they're positive. I have also advised pt that if his/her COVID-19 test returns positive, it's recommended to self-isolate for at least 10 days after symptoms first appeared AND until fever-free for 24 hours without fever reducer AND other symptoms have improved or resolved. Discussed self-isolation recommendations as well as instructions for household member/close contacts as per the [facility] and [place] DHHS, and also gave patient the [place] packet with this information.  Patient/parent/caregiver has been advised to return to the [facility] or PCP in 3-5 days if no better; to PCP or the Emergency Department if new signs and symptoms develop, or if the current signs or symptoms continue to change or worsen for further workup, evaluation and treatment as clinically indicated and appropriate  The patient will follow up with their current PCP if and as advised. If the patient does not currently have a PCP we will assist them in obtaining one.   The patient may need specialty follow up if the symptoms continue, in spite of conservative treatment and management, for further workup, evaluation, consultation and treatment as clinically indicated and appropriate.  Patient/parent/caregiver verbalized understanding and agreement of plan as discussed.  All questions were addressed  during visit.  Please see discharge instructions below for further details of plan.  Discharge Instructions:   Discharge Instructions      Please go to the [place] location to have your chest x-ray performed.  Viral testing and white blood cell count evaluation was performed here in the office, when the results of that testing is available it will be posted to your MyChart and if there are any concerning findings, you will be contacted by phone with further recommendations, if any.  In the meantime, please begin antiviral treatment with Tamiflu, 1 capsule twice daily for the next 5 days.  This is used to treat influenza.  For possible community-acquired pneumonia, please begin amoxicillin clavulanate 1 tablet twice daily for the next 5 days and azithromycin 1 tablet daily for 3 days.  I also provided you with an inhaled long-acting albuterol and corticosteroid called Symbicort, please inhale 2 puffs twice daily for the next 2 weeks.  Because this inhaled medication contains a steroid, it is very important that you always wash your mouth out after using it to avoid getting thrush.  Of also provided you with a cough medicine called Promethazine DM which should give you significant relief of your cough and help you sleep at night.  As we discussed, it is very important that someone reevaluate your lungs after 2 to 3 days of treatment.  This location is closed on the weekends but the [facility] location on [address] is open on the weekends, please plan to arrive close to 8 AM if you do not want to wait very long to be seen.        Lynden Oxford Scales, PA-C 05/09/21 1348

## 2021-05-09 NOTE — Telephone Encounter (Signed)
Patient was her for a lab appointment and stated she had a cough took a covid test and it came back negative. Patient states she has blood clots in her chest and wants to make sure everything will be ok. Patient thinks its just a cough but wants to make sure it couldn't be anything else.

## 2021-05-09 NOTE — Telephone Encounter (Signed)
See note

## 2021-05-09 NOTE — ED Triage Notes (Addendum)
Patient presents to [facility] for evaluation fo 2 days of cough, nasal congestion, and chest pain while coughing.  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  Multiple hernias and states has pain with these when she coughs

## 2021-05-09 NOTE — Telephone Encounter (Signed)
Recommend office visit.  Traci Harper. [name], MD 05/09/2021 11:21 AM

## 2021-05-09 NOTE — Discharge Instructions (Addendum)
Please go to the [facility] location to have your chest x-ray performed.  Viral testing and white blood cell count evaluation was performed here in the office, when the results of that testing is available it will be posted to your MyChart and if there are any concerning findings, you will be contacted by phone with further recommendations, if any.  In the meantime, please begin antiviral treatment with Tamiflu, 1 capsule twice daily for the next 5 days.  This is used to treat influenza.  For possible community-acquired pneumonia, please begin amoxicillin clavulanate 1 tablet twice daily for the next 5 days and azithromycin 1 tablet daily for 3 days.  I also provided you with an inhaled long-acting albuterol and corticosteroid called Symbicort, please inhale 2 puffs twice daily for the next 2 weeks.  Because this inhaled medication contains a steroid, it is very important that you always wash your mouth out after using it to avoid getting thrush.  Of also provided you with a cough medicine called Promethazine DM which should give you significant relief of your cough and help you sleep at night.  As we discussed, it is very important that someone reevaluate your lungs after 2 to 3 days of treatment.  This location is closed on the weekends but the [facility] location on [address] is open on the weekends, please plan to arrive close to 8 AM if you do not want to wait very long to be seen.

## 2021-05-10 NOTE — Progress Notes (Signed)
Please inform patient of the following:  Thyroid level at goal. We can recheck next time she comes in but she can continue her current medication.  Katina Degree. [name], MD 05/10/2021 8:07 AM

## 2021-05-10 NOTE — Telephone Encounter (Signed)
Patient was seen in the ED. 

## 2021-05-10 NOTE — Telephone Encounter (Signed)
LVM for patient to call back and schedule appt

## 2021-05-10 NOTE — Progress Notes (Signed)
Pt notified and voiced understanding 

## 2021-05-11 LAB — CBC WITH DIFFERENTIAL/PLATELET
Basophils Absolute: 0 10*3/uL (ref 0.0–0.2)
Basos: 0 %
EOS (ABSOLUTE): 0.1 10*3/uL (ref 0.0–0.4)
Eos: 1 %
Hematocrit: 39.9 % (ref 34.0–46.6)
Hemoglobin: 13.5 g/dL (ref 11.1–15.9)
Immature Grans (Abs): 0 10*3/uL (ref 0.0–0.1)
Immature Granulocytes: 0 %
Lymphocytes Absolute: 1.3 10*3/uL (ref 0.7–3.1)
Lymphs: 25 %
MCH: 31.8 pg (ref 26.6–33.0)
MCHC: 33.8 g/dL (ref 31.5–35.7)
MCV: 94 fL (ref 79–97)
Monocytes Absolute: 0.5 10*3/uL (ref 0.1–0.9)
Monocytes: 9 %
Neutrophils Absolute: 3.4 10*3/uL (ref 1.4–7.0)
Neutrophils: 65 %
Platelets: 201 10*3/uL (ref 150–450)
RBC: 4.24 x10E6/uL (ref 3.77–5.28)
RDW: 12.8 % (ref 11.7–15.4)
WBC: 5.3 10*3/uL (ref 3.4–10.8)

## 2021-05-11 LAB — NOVEL CORONAVIRUS, NAA: SARS-CoV-2, NAA: NOT DETECTED

## 2021-06-29 ENCOUNTER — Telehealth: Payer: Self-pay | Admitting: *Deleted

## 2021-06-29 NOTE — Telephone Encounter (Signed)
PA  Key: Marvis Repress - PA Case ID: [id number] Outcome Additional Information Required This medication or product was previously approved on [id number] from 2021-01-23 to 2022-01-23. Drug Trulicity 1.5MG /0.5ML pen-injectors

## 2021-07-02 ENCOUNTER — Telehealth: Payer: Self-pay | Admitting: *Deleted

## 2021-07-02 NOTE — Telephone Encounter (Signed)
PA Rx Trulicity approved from 01/23/2021 - 01/23/2022

## 2021-10-01 ENCOUNTER — Ambulatory Visit (INDEPENDENT_AMBULATORY_CARE_PROVIDER_SITE_OTHER): Payer: No Typology Code available for payment source | Admitting: Family Medicine

## 2021-10-01 ENCOUNTER — Encounter: Payer: Self-pay | Admitting: Family Medicine

## 2021-10-01 VITALS — BP 142/83 | HR 74 | Temp 97.5°F | Ht 63.0 in | Wt 235.4 lb

## 2021-10-01 DIAGNOSIS — E1169 Type 2 diabetes mellitus with other specified complication: Secondary | ICD-10-CM

## 2021-10-01 DIAGNOSIS — I152 Hypertension secondary to endocrine disorders: Secondary | ICD-10-CM

## 2021-10-01 DIAGNOSIS — E785 Hyperlipidemia, unspecified: Secondary | ICD-10-CM | POA: Diagnosis not present

## 2021-10-01 DIAGNOSIS — Z23 Encounter for immunization: Secondary | ICD-10-CM

## 2021-10-01 DIAGNOSIS — E1165 Type 2 diabetes mellitus with hyperglycemia: Secondary | ICD-10-CM | POA: Diagnosis not present

## 2021-10-01 DIAGNOSIS — E039 Hypothyroidism, unspecified: Secondary | ICD-10-CM | POA: Diagnosis not present

## 2021-10-01 DIAGNOSIS — I2699 Other pulmonary embolism without acute cor pulmonale: Secondary | ICD-10-CM

## 2021-10-01 DIAGNOSIS — E1159 Type 2 diabetes mellitus with other circulatory complications: Secondary | ICD-10-CM | POA: Diagnosis not present

## 2021-10-01 DIAGNOSIS — F439 Reaction to severe stress, unspecified: Secondary | ICD-10-CM

## 2021-10-01 DIAGNOSIS — Z0001 Encounter for general adult medical examination with abnormal findings: Secondary | ICD-10-CM

## 2021-10-01 LAB — COMPREHENSIVE METABOLIC PANEL
ALT: 60 U/L — ABNORMAL HIGH (ref 0–35)
AST: 69 U/L — ABNORMAL HIGH (ref 0–37)
Albumin: 4 g/dL (ref 3.5–5.2)
Alkaline Phosphatase: 58 U/L (ref 39–117)
BUN: 12 mg/dL (ref 6–23)
CO2: 28 mEq/L (ref 19–32)
Calcium: 9.7 mg/dL (ref 8.4–10.5)
Chloride: 100 mEq/L (ref 96–112)
Creatinine, Ser: 1.37 mg/dL — ABNORMAL HIGH (ref 0.40–1.20)
GFR: 41.07 mL/min — ABNORMAL LOW (ref >60.00)
Glucose, Bld: 134 mg/dL — ABNORMAL HIGH (ref 70–99)
Potassium: 5.3 mEq/L — ABNORMAL HIGH (ref 3.5–5.1)
Sodium: 141 mEq/L (ref 135–145)
Total Bilirubin: 0.5 mg/dL (ref 0.2–1.2)
Total Protein: 6.5 g/dL (ref 6.0–8.3)

## 2021-10-01 LAB — HEMOGLOBIN A1C: Hgb A1c MFr Bld: 7.3 % — ABNORMAL HIGH (ref 4.6–6.5)

## 2021-10-01 LAB — LIPID PANEL
Cholesterol: 238 mg/dL — ABNORMAL HIGH (ref 0–200)
HDL: 54.3 mg/dL (ref >39.00)
Total CHOL/HDL Ratio: 4
Triglycerides: 464 mg/dL — ABNORMAL HIGH (ref 0.0–149.0)

## 2021-10-01 LAB — CBC
HCT: 41 % (ref 36.0–46.0)
Hemoglobin: 13.6 g/dL (ref 12.0–15.0)
MCHC: 33.2 g/dL (ref 30.0–36.0)
MCV: 102 fl — ABNORMAL HIGH (ref 78.0–100.0)
Platelets: 226 10*3/uL (ref 150.0–400.0)
RBC: 4.02 Mil/uL (ref 3.87–5.11)
RDW: 14.2 % (ref 11.5–15.5)
WBC: 7.1 10*3/uL (ref 4.0–10.5)

## 2021-10-01 LAB — TSH: TSH: 7.06 u[IU]/mL — ABNORMAL HIGH (ref 0.35–5.50)

## 2021-10-01 LAB — LDL CHOLESTEROL, DIRECT: Direct LDL: 139 mg/dL

## 2021-10-01 MED ORDER — OLMESARTAN MEDOXOMIL 5 MG PO TABS: 5.0000 mg | ORAL_TABLET | Freq: Every day | ORAL | 1 refills | Status: DC

## 2021-10-01 MED ORDER — METFORMIN HCL 1000 MG PO TABS
1000.0000 mg | ORAL_TABLET | Freq: Two times a day (BID) | ORAL | 3 refills | Status: DC
Start: 2021-10-01 — End: 2022-09-22

## 2021-10-01 MED ORDER — MAGNESIUM OXIDE 400 MG PO TABS: 1.0000 | ORAL_TABLET | Freq: Two times a day (BID) | ORAL | 3 refills | Status: DC

## 2021-10-01 MED ORDER — OMEPRAZOLE 20 MG PO CPDR: 20.0000 mg | DELAYED_RELEASE_CAPSULE | Freq: Every day | ORAL | 3 refills | Status: DC

## 2021-10-01 MED ORDER — ELIQUIS 5 MG PO TABS: 5.0000 mg | ORAL_TABLET | Freq: Two times a day (BID) | ORAL | 5 refills | Status: DC

## 2021-10-01 NOTE — Assessment & Plan Note (Signed)
Patient has been under a lot of stress recently.  We will place referral for therapist. ?

## 2021-10-01 NOTE — Assessment & Plan Note (Signed)
Check A1c.  Currently on metformin without milligrams twice daily and Trulicity 1.5 mg weekly.  If A1c is elevated we will likely try to switch Trulicity Mounjaro. ?

## 2021-10-01 NOTE — Assessment & Plan Note (Signed)
Check labs 

## 2021-10-01 NOTE — Assessment & Plan Note (Signed)
Stable on Eliquis 5 mg twice daily.  She will follow-up with hematology soon to discuss her anticoagulation. ?

## 2021-10-01 NOTE — Assessment & Plan Note (Signed)
Slightly above goal though typically well controlled.  Continue metoprolol tartrate 50 mg twice daily and olmesartan 5 mg daily. ?

## 2021-10-01 NOTE — Assessment & Plan Note (Signed)
Continue Synthroid 150 mcg daily.  Check TSH today. ?

## 2021-10-01 NOTE — Patient Instructions (Signed)
It was very nice to see you today! ? ?We we will refer you to see one of our counselors. ? ?We will give your tetanus vaccine today. ? ?We will check blood work today. ? ?Please continue to work on diet and exercise. ? ?We will see you back in about 6 months.  Come back sooner if needed. ? ?Take care, ?Dr Jerline Pain ? ?PLEASE NOTE: ? ?If you had any lab tests please let us know if you have not heard back within a few days. You may see your results on mychart before we have a chance to review them but we will give you a call once they are reviewed by [place]. If we ordered any referrals today, please let us know if you have not heard from their office within the next week.  ? ?Please try these tips to maintain a healthy lifestyle: ? ?Eat at least 3 REAL meals and 1-2 snacks per day.  Aim for no more than 5 hours between eating.  If you eat breakfast, please do so within one hour of getting up.  ? ?Each meal should contain half fruits/vegetables, one quarter protein, and one quarter carbs (no bigger than a computer mouse) ? ?Cut down on sweet beverages. This includes juice, soda, and sweet tea.  ? ?Drink at least 1 glass of water with each meal and aim for at least 8 glasses per day ? ?Exercise at least 150 minutes every week.   ? ?Preventive Care 57-42 Years Old, Female ?Preventive care refers to lifestyle choices and visits with your health care provider that can promote health and wellness. Preventive care visits are also called wellness exams. ?What can I expect for my preventive care visit? ?Counseling ?Your health care provider may ask you questions about your: ?Medical history, including: ?Past medical problems. ?Family medical history. ?Pregnancy history. ?Current health, including: ?Menstrual cycle. ?Method of birth control. ?Emotional well-being. ?Home life and relationship well-being. ?Sexual activity and sexual health. ?Lifestyle, including: ?Alcohol, nicotine or tobacco, and drug use. ?Access to firearms. ?Diet,  exercise, and sleep habits. ?Work and work Statistician. ?Sunscreen use. ?Safety issues such as seatbelt and bike helmet use. ?Physical exam ?Your health care provider will check your: ?Height and weight. These may be used to calculate your BMI (body mass index). BMI is a measurement that tells if you are at a healthy weight. ?Waist circumference. This measures the distance around your waistline. This measurement also tells if you are at a healthy weight and may help predict your risk of certain diseases, such as type 2 diabetes and high blood pressure. ?Heart rate and blood pressure. ?Body temperature. ?Skin for abnormal spots. ?What immunizations do I need? ? ?Vaccines are usually given at various ages, according to a schedule. Your health care provider will recommend vaccines for you based on your age, medical history, and lifestyle or other factors, such as travel or where you work. ?What tests do I need? ?Screening ?Your health care provider may recommend screening tests for certain conditions. This may include: ?Lipid and cholesterol levels. ?Diabetes screening. This is done by checking your blood sugar (glucose) after you have not eaten for a while (fasting). ?Pelvic exam and Pap test. ?Hepatitis B test. ?Hepatitis C test. ?HIV (human immunodeficiency virus) test. ?STI (sexually transmitted infection) testing, if you are at risk. ?Lung cancer screening. ?Colorectal cancer screening. ?Mammogram. Talk with your health care provider about when you should start having regular mammograms. This may depend on whether you have a family history  of breast cancer. ?BRCA-related cancer screening. This may be done if you have a family history of breast, ovarian, tubal, or peritoneal cancers. ?Bone density scan. This is done to screen for osteoporosis. ?Talk with your health care provider about your test results, treatment options, and if necessary, the need for more tests. ?Follow these instructions at home: ?Eating and  drinking ? ?Eat a diet that includes fresh fruits and vegetables, whole grains, lean protein, and low-fat dairy products. ?Take vitamin and mineral supplements as recommended by your health care provider. ?Do not drink alcohol if: ?Your health care provider tells you not to drink. ?You are pregnant, may be pregnant, or are planning to become pregnant. ?If you drink alcohol: ?Limit how much you have to 0-1 drink a day. ?Know how much alcohol is in your drink. In the [place] one drink equals one 12 oz bottle of beer (355 mL), one 5 oz glass of wine (148 mL), or one 1? oz glass of hard liquor (44 mL). ?Lifestyle ?Brush your teeth every morning and night with fluoride toothpaste. Floss one time each day. ?Exercise for at least 30 minutes 5 or more days each week. ?Do not use any products that contain nicotine or tobacco. These products include cigarettes, chewing tobacco, and vaping devices, such as e-cigarettes. If you need help quitting, ask your health care provider. ?Do not use drugs. ?If you are sexually active, practice safe sex. Use a condom or other form of protection to prevent STIs. ?If you do not wish to become pregnant, use a form of birth control. If you plan to become pregnant, see your health care provider for a prepregnancy visit. ?Take aspirin only as told by your health care provider. Make sure that you understand how much to take and what form to take. Work with your health care provider to find out whether it is safe and beneficial for you to take aspirin daily. ?Find healthy ways to manage stress, such as: ?Meditation, yoga, or listening to music. ?Journaling. ?Talking to a trusted person. ?Spending time with friends and family. ?Minimize exposure to UV radiation to reduce your risk of skin cancer. ?Safety ?Always wear your seat belt while driving or riding in a vehicle. ?Do not drive: ?If you have been drinking alcohol. Do not ride with someone who has been drinking. ?When you are tired or  distracted. ?While texting. ?If you have been using any mind-altering substances or drugs. ?Wear a helmet and other protective equipment during sports activities. ?If you have firearms in your house, make sure you follow all gun safety procedures. ?Seek help if you have been physically or sexually abused. ?What's next? ?Visit your health care provider once a year for an annual wellness visit. ?Ask your health care provider how often you should have your eyes and teeth checked. ?Stay up to date on all vaccines. ?This information is not intended to replace advice given to you by your health care provider. Make sure you discuss any questions you have with your health care provider. ?Document Revised: 11/14/2020 Document Reviewed: 11/14/2020 ?Elsevier Patient Education ? [place]. ? ?

## 2021-10-01 NOTE — Progress Notes (Signed)
? ?Chief Complaint:  ?Traci Harper is a 66 y.o. female who presents today for her annual comprehensive physical exam.   ? ?Assessment/Plan:  ?Chronic Problems Addressed Today: ?Dyslipidemia associated with type 2 diabetes mellitus ([place]) ?Check labs. ? ?Stress ?Patient has been under a lot of stress recently.  We will place referral for therapist. ? ?Pulmonary embolism ([place]) ?Stable on Eliquis 5 mg twice daily.  She will follow-up with hematology soon to discuss her anticoagulation. ? ?Hypothyroidism ?Continue Synthroid 150 mcg daily.  Check TSH today. ? ?Type 2 diabetes mellitus with hyperglycemia ([place]) ?Check A1c.  Currently on metformin without milligrams twice daily and Trulicity 1.5 mg weekly.  If A1c is elevated we will likely try to switch Trulicity Mounjaro. ? ?Hypertension associated with diabetes ([place]) ?Slightly above goal though typically well controlled.  Continue metoprolol tartrate 50 mg twice daily and olmesartan 5 mg daily. ? ?Preventative Healthcare: ?Tdap given today.  She will check with her hepatologist about receiving shingles vaccine I think this would probably be okay.  She will check on the status of her COVID vaccines.  She will be getting mammogram soon.  She will follow-up with GYN soon for Pap smear.  Up-to-date on colon cancer screening. ? ?Patient Counseling(The following topics were reviewed and/or handout was given): ? -Nutrition: Stressed importance of moderation in sodium/caffeine intake, saturated fat and cholesterol, caloric balance, sufficient intake of fresh fruits, vegetables, and fiber. ? -Stressed the importance of regular exercise.  ? -Substance Abuse: Discussed cessation/primary prevention of tobacco, alcohol, or other drug use; driving or other dangerous activities under the influence; availability of treatment for abuse.  ? -Injury prevention: Discussed safety belts, safety helmets, smoke detector, smoking near bedding or upholstery.  ? -Sexuality: Discussed sexually  transmitted diseases, partner selection, use of condoms, avoidance of unintended pregnancy and contraceptive alternatives.  ? -Dental health: Discussed importance of regular tooth brushing, flossing, and dental visits. ? -Health maintenance and immunizations reviewed. Please refer to Health maintenance section. ? ?Return to care in 1 year for next preventative visit.  ? ?  ?Subjective:  ?HPI: ? ?She has no acute complaints today.  ? ?She has been under a lot of stress recently due to the mental health of her 1 year old grand daughter. She has had issues with extreme anxiety and depression and has attempted suicide a couple of times.  This has been a significant source of stress for patient.  She is not sure around how to act around her Granddaughter.  Feels like she is walking on eggshells. ? ?Lifestyle ?Diet: None specific.  ?Exercise: Limited.  ? ? ?  10/01/2021  ?  8:06 AM  ?Depression screen PHQ 2/9  ?Decreased Interest 0  ?Down, Depressed, Hopeless 0  ?PHQ - 2 Score 0  ? ? ?Health Maintenance Due  ?Topic Date Due  ? FOOT EXAM  Never done  ? OPHTHALMOLOGY EXAM  Never done  ? HIV Screening  Never done  ? Zoster Vaccines- Shingrix (1 of 2) Never done  ? PAP SMEAR-Modifier  05/08/2017  ? TETANUS/TDAP  03/25/2020  ? COVID-19 Vaccine (6 - Booster) 04/08/2020  ? MAMMOGRAM  03/09/2021  ? HEMOGLOBIN A1C  09/30/2021  ?  ? ?ROS: Per HPI, otherwise a complete review of systems was negative.  ? ?PMH: ? ?The following were reviewed and entered/updated in epic: ?Past Medical History:  ?Diagnosis Date  ? Diabetes mellitus   ? Hepatic encephalopathy ([place])   ? Hypertension   ? Hypothyroidism   ?  Kidney insufficiency   ? Molar pregnancy   ? at age 24  ? Thyroid disease   ? ?Patient Active Problem List  ? Diagnosis Date Noted  ? Stress 10/01/2021  ? Dyslipidemia associated with type 2 diabetes mellitus ([place]) 10/01/2021  ? Pulmonary embolism ([place]) 04/02/2021  ? Hypertension associated with diabetes ([place]) 04/23/2020  ? Type 2  diabetes mellitus with hyperglycemia ([place]) 04/23/2020  ? Hypothyroidism 04/23/2020  ? GERD (gastroesophageal reflux disease) 04/23/2020  ? Liver transplanted 2014 [facility]) follows with [facility] 04/01/2013  ? ?Past Surgical History:  ?Procedure Laterality Date  ? APPENDECTOMY    ? BACK SURGERY    ? BREAST BIOPSY Right   ? CESAREAN SECTION    ? x2  ? CHOLECYSTECTOMY    ? DILATION AND CURETTAGE OF UTERUS    ? at 17  ? HEMORRHOID SURGERY    ? HERNIA REPAIR    ? LIVER TRANSPLANT  09/17/2012  ? [facility]  ? THYROID SURGERY    ? x2  ? TUBAL LIGATION    ? V-TACH ABLATION    ? ? ?Family History  ?Problem Relation Age of Onset  ? Diabetes Mother   ? Heart disease Father   ? Diabetes Father   ? ? ?Medications- reviewed and updated ?Current Outpatient Medications  ?Medication Sig Dispense Refill  ? Ascorbic Acid (VITAMIN C) 100 MG tablet Take 100 mg by mouth daily.    ? ascorbic acid (VITAMIN C) 1000 MG tablet Take 1 tablet by mouth daily.    ? Blood Glucose Monitoring Suppl (ONETOUCH VERIO FLEX SYSTEM) w/Device KIT USE   TO CHECK GLUCOSE FOR BLOOD SUGAR 1 kit 3  ? budesonide-formoterol (SYMBICORT) 160-4.5 MCG/ACT inhaler Inhale 2 puffs into the lungs in the morning and at bedtime. 10.2 g 0  ? calcium citrate-vitamin D (CITRACAL+D) 315-200 MG-UNIT tablet Take 1 tablet by mouth 2 (two) times daily.    ? Dulaglutide (TRULICITY) 1.5 WS/5.6CL SOPN Inject 1.5 mg into the skin once a week. 6 mL 3  ? glucose blood test strip 1 each by Other route as needed for other. Use as instructed 100 each 1  ? levothyroxine (SYNTHROID) 150 MCG tablet Take 1 tablet (150 mcg total) by mouth daily. 90 tablet 3  ? metoprolol tartrate (LOPRESSOR) 50 MG tablet TAKE 1 TABLET BY MOUTH TWICE A DAY 180 tablet 1  ? Multiple Vitamin (ONE DAILY) tablet Take 1 tablet by mouth daily.     ? mycophenolate (MYFORTIC) 360 MG TBEC Take 360 mg by mouth 2 (two) times daily.     ? PROGRAF 0.5 MG capsule Take 0.5 mg by mouth. Two tablets in the morning and 1  tablet in the evening  10  ? promethazine-dextromethorphan (PROMETHAZINE-DM) 6.25-15 MG/5ML syrup Take 5 mLs by mouth 4 (four) times daily as needed for cough. 180 mL 0  ? ELIQUIS 5 MG TABS tablet Take 1 tablet (5 mg total) by mouth 2 (two) times daily. 60 tablet 5  ? magnesium oxide (MAG-OX) 400 MG tablet Take 1 tablet (400 mg total) by mouth 2 (two) times daily. 180 tablet 3  ? metFORMIN (GLUCOPHAGE) 1000 MG tablet Take 1 tablet (1,000 mg total) by mouth 2 (two) times daily with a meal. 180 tablet 3  ? olmesartan (BENICAR) 5 MG tablet Take 1 tablet (5 mg total) by mouth daily. 90 tablet 1  ? omeprazole (PRILOSEC) 20 MG capsule Take 1 capsule (20 mg total) by mouth daily. 90 capsule 3  ? ?  No current facility-administered medications for this visit.  ? ? ?Allergies-reviewed and updated ?Allergies  ?Allergen Reactions  ? Lisinopril Cough  ? ? ?Social History  ? ?Socioeconomic History  ? Marital status: Married  ?  Spouse name: Not on file  ? Number of children: Not on file  ? Years of education: Not on file  ? Highest education level: Not on file  ?Occupational History  ? Not on file  ?Tobacco Use  ? Smoking status: Former  ?  Types: Cigarettes  ?  Quit date: 06/02/2002  ?  Years since quitting: 19.3  ? Smokeless tobacco: Not on file  ?Substance and Sexual Activity  ? Alcohol use: No  ?  Comment: No alcohol since 2012  ? Drug use: No  ? Sexual activity: Never  ?Other Topics Concern  ? Not on file  ?Social History Narrative  ? Not on file  ? ?Social Determinants of Health  ? ?Financial Resource Strain: Not on file  ?Food Insecurity: Not on file  ?Transportation Needs: Not on file  ?Physical Activity: Not on file  ?Stress: Not on file  ?Social Connections: Not on file  ? ?   ?  ?Objective:  ?Physical Exam: ?BP (!) 142/83   Pulse 74   Temp (!) 97.5 ?F (36.4 ?C) (Temporal)   Ht 5' 3"  (1.6 m)   Wt 235 lb 6.4 oz (106.8 kg)   SpO2 96%   BMI 41.70 kg/m?   ?Body mass index is 41.7 kg/m?. ?Wt Readings from Last 3  Encounters:  ?10/01/21 235 lb 6.4 oz (106.8 kg)  ?04/02/21 237 lb (107.5 kg)  ?10/22/20 227 lb 6.4 oz (103.1 kg)  ? ?Gen: NAD, resting comfortably ?HEENT: TMs normal bilaterally. OP clear. No thyromegaly noted.  ?CV: RRR with no

## 2021-10-03 NOTE — Progress Notes (Signed)
Please inform patient of the following: ? ?Her potassium, kidney, and liver numbers are off a bit.  This may be a lab error.  Can we have her come back in a week or so to recheck c-Met.  Please place future order. ? ?Her A1c is a 7.3.  We can continue her current regimen for now and recheck again in 3 to 6 months. ? ?Her thyroid level is off.  I would like to recheck 1 more time before we change medication.  Please place future order for TSH. ? ?

## 2021-10-05 ENCOUNTER — Other Ambulatory Visit: Payer: Self-pay | Admitting: *Deleted

## 2021-10-05 DIAGNOSIS — E039 Hypothyroidism, unspecified: Secondary | ICD-10-CM

## 2021-10-10 ENCOUNTER — Telehealth: Payer: Self-pay | Admitting: Family Medicine

## 2021-10-10 NOTE — Telephone Encounter (Signed)
Pt states she was instructed to call with fax number of the [facility] where she will be getting a blood draw.  ? ?Pt requests current lab orders to be sent soon so she can "only be stuck once". ? ?[facility] Fax ?[phone] ?

## 2021-10-10 NOTE — Telephone Encounter (Signed)
Patient called back - also wants to include comprehensive lab orders to fax.  ?

## 2021-10-10 NOTE — Telephone Encounter (Signed)
Order faxed to [phone] ?

## 2021-11-21 ENCOUNTER — Ambulatory Visit: Payer: No Typology Code available for payment source | Admitting: Psychologist

## 2021-12-07 ENCOUNTER — Other Ambulatory Visit: Payer: Self-pay | Admitting: Family Medicine

## 2021-12-16 ENCOUNTER — Ambulatory Visit (INDEPENDENT_AMBULATORY_CARE_PROVIDER_SITE_OTHER): Payer: No Typology Code available for payment source | Admitting: Psychologist

## 2021-12-16 DIAGNOSIS — F321 Major depressive disorder, single episode, moderate: Secondary | ICD-10-CM

## 2021-12-16 NOTE — Progress Notes (Signed)
                Azizi Bally, PsyD 

## 2021-12-16 NOTE — Plan of Care (Signed)

## 2021-12-16 NOTE — Progress Notes (Signed)
[facility] Counselor Initial Adult Exam  Name: ARVETTA ARAQUE Date: 12/16/2021 MRN: [id number] DOB: 1955-07-24 PCP: Vivi Barrack, MD  Time spent: 11:05 am to 11:40 am; total time: 35 minutes  This session was held via in person. The patient consented to in-person therapy and was in the clinician's office. Limits of confidentiality were discussed with the patient.   Guardian/Payee:  NA    Paperwork requested: No   Reason for Visit /Presenting Problem: Depression secondary to family conflict  Mental Status Exam: Appearance:   Well Groomed     Behavior:  Appropriate  Motor:  Normal  Speech/Language:   Clear and Coherent  Affect:  Appropriate  Mood:  normal  Thought process:  normal  Thought content:    WNL  Sensory/Perceptual disturbances:    WNL  Orientation:  oriented to person, place, and time/date  Attention:  Good  Concentration:  Good  Memory:  WNL  Fund of knowledge:   Good  Insight:    Fair  Judgment:   Good  Impulse Control:  Good     Reported Symptoms:  The patient endorsed experiencing the following: feeling down, sad, rumination of thoughts, low self-esteem, tearful, fatigue, social isolation, thoughts of hopelessness, and avoiding pleasurable activities. She denied suicidal and homicidal ideation.   Risk Assessment: Danger to Self:  No Self-injurious Behavior: No Danger to Others: No Duty to Warn:no Physical Aggression / Violence:No  Access to Firearms a concern: No  Gang Involvement:No  Patient / guardian was educated about steps to take if suicide or homicide risk level increases between visits: n/a While future psychiatric events cannot be accurately predicted, the patient does not currently require acute inpatient psychiatric care and does not currently meet [facility] involuntary commitment criteria.  Substance Abuse History: Current substance abuse: No     Past Psychiatric History:   Previous psychological history is  significant for saw psychologist for a liver transplant Outpatient Providers:NA History of Psych Hospitalization: No  Psychological Testing:  NA    Abuse History:  Victim of: No.,  NA    Report needed: No. Victim of Neglect:No. Perpetrator of  NA   Witness / Exposure to Domestic Violence: No   Protective Services Involvement: No  Witness to [employer] Violence:  No   Family History:  Family History  Problem Relation Age of Onset   Diabetes Mother    Heart disease Father    Diabetes Father     Living situation: the patient lives with their family  Sexual Orientation: Straight  Relationship Status: married  Name of spouse / other:Tim If a parent, number of children / ages:Patient has two sons whose names are Darlyn Chamber and Holland. She has three granddaughters. Per the patient, her sons are not getting along currently because of the family conflict.   Support Systems: spouse  Museum/gallery curator Stress:  No   Income/Employment/Disability: Actor: No   Educational History: Education: high school diploma/GED  Religion/Sprituality/World View: Christian  Any cultural differences that may affect / interfere with treatment:  not applicable   Recreation/Hobbies: Being with grandchildren  Stressors: Other: family conflict due to incident between two of the granddaughters    Strengths: Supportive Relationships  Barriers:  NA   Legal History: Pending legal issue / charges: The patient has no significant history of legal issues. History of legal issue / charges:  NA  Medical History/Surgical History: reviewed Past Medical History:  Diagnosis Date   Diabetes mellitus  Hepatic encephalopathy (HCC)    Hypertension    Hypothyroidism    Kidney insufficiency    Molar pregnancy    at age 59   Thyroid disease     Past Surgical History:  Procedure Laterality Date   APPENDECTOMY     BACK SURGERY     BREAST BIOPSY Right    CESAREAN SECTION      x2   CHOLECYSTECTOMY     DILATION AND CURETTAGE OF UTERUS     at [place]     LIVER TRANSPLANT  09/17/2012   _0  of [place]   THYROID SURGERY     x2   TUBAL LIGATION     V-TACH ABLATION      Medications: Current Outpatient Medications  Medication Sig Dispense Refill   Ascorbic Acid (VITAMIN C) 100 MG tablet Take 100 mg by mouth daily.     ascorbic acid (VITAMIN C) 1000 MG tablet Take 1 tablet by mouth daily.     Blood Glucose Monitoring Suppl (ONETOUCH VERIO FLEX SYSTEM) w/Device KIT USE   TO CHECK GLUCOSE FOR BLOOD SUGAR 1 kit 3   budesonide-formoterol (SYMBICORT) 160-4.5 MCG/ACT inhaler Inhale 2 puffs into the lungs in the morning and at bedtime. 10.2 g 0   calcium citrate-vitamin D (CITRACAL+D) 315-200 MG-UNIT tablet Take 1 tablet by mouth 2 (two) times daily.     Dulaglutide (TRULICITY) 1.5 HW/8.6HU SOPN Inject 1.5 mg into the skin once a week. 6 mL 3   ELIQUIS 5 MG TABS tablet Take 1 tablet (5 mg total) by mouth 2 (two) times daily. 60 tablet 5   glucose blood test strip 1 each by Other route as needed for other. Use as instructed 100 each 1   levothyroxine (SYNTHROID) 150 MCG tablet Take 1 tablet (150 mcg total) by mouth daily. 90 tablet 3   magnesium oxide (MAG-OX) 400 MG tablet Take 1 tablet (400 mg total) by mouth 2 (two) times daily. 180 tablet 3   metFORMIN (GLUCOPHAGE) 1000 MG tablet Take 1 tablet (1,000 mg total) by mouth 2 (two) times daily with a meal. 180 tablet 3   metoprolol tartrate (LOPRESSOR) 50 MG tablet TAKE 1 TABLET BY MOUTH TWICE A DAY 180 tablet 1   Multiple Vitamin (ONE DAILY) tablet Take 1 tablet by mouth daily.      mycophenolate (MYFORTIC) 360 MG TBEC Take 360 mg by mouth 2 (two) times daily.      olmesartan (BENICAR) 5 MG tablet Take 1 tablet (5 mg total) by mouth daily. 90 tablet 1   omeprazole (PRILOSEC) 20 MG capsule Take 1 capsule (20 mg total) by mouth daily. 90 capsule 3   PROGRAF 0.5 MG capsule Take 0.5  mg by mouth. Two tablets in the morning and 1 tablet in the evening  10   promethazine-dextromethorphan (PROMETHAZINE-DM) 6.25-15 MG/5ML syrup Take 5 mLs by mouth 4 (four) times daily as needed for cough. 180 mL 0   No current facility-administered medications for this visit.    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 The patient lives at home with her husband and two kittens. The patient meets criteria for a diagnosis of F32.1 major depressive affective disorder, single episode moderate based  off of the following: feeling down, sad, rumination of thoughts, low self-esteem, tearful, fatigue, social isolation, thoughts of hopelessness, and avoiding pleasurable activities. She denied suicidal and homicidal ideation.   The patient stated that she needs a place to process emotions, thoughts, and developing coping skills  This psychologist makes the recommendation that the patient participate in therapy at least once a month. Would benefit from bi-weekly if possible.    Conception Chancy, PsyD

## 2022-01-08 ENCOUNTER — Ambulatory Visit (INDEPENDENT_AMBULATORY_CARE_PROVIDER_SITE_OTHER): Payer: No Typology Code available for payment source | Admitting: Psychologist

## 2022-01-08 DIAGNOSIS — F321 Major depressive disorder, single episode, moderate: Secondary | ICD-10-CM

## 2022-01-08 NOTE — Progress Notes (Signed)
                Arney Mayabb, PsyD 

## 2022-01-08 NOTE — Progress Notes (Signed)
[facility] Counselor/Therapist Progress Note  Patient ID: Traci Harper, MRN: [id number],    Date: 01/08/2022  Time Spent: 08:07 am to 08:47 am; total time: 40 minutes   This session was held via in person. The patient consented to in-person therapy and was in the clinician's office. Limits of confidentiality were discussed with the patient.   Treatment Type: Individual Therapy  Reported Symptoms: Distress related to grandchild's behaviors and questions related to behaviors  Mental Status Exam: Appearance:  Well Groomed     Behavior: Appropriate  Motor: Normal  Speech/Language:  Clear and Coherent  Affect: Appropriate  Mood: normal  Thought process: normal  Thought content:   WNL  Sensory/Perceptual disturbances:   WNL  Orientation: oriented to person, place, and time/date  Attention: Good  Concentration: Good  Memory: WNL  Fund of knowledge:  Good  Insight:   Fair  Judgment:  Good  Impulse Control: Good   Risk Assessment: Danger to Self:  No Self-injurious Behavior: No Danger to Others: No Duty to Warn:no Physical Aggression / Violence:No  Access to Firearms a concern: No  Gang Involvement:No   Subjective: Beginning the session, patient described herself as doing well while reflecting on trip to the beach. After reviewing the treatment plan, patient spent the session asking questions related to grandchild's Billey Gosling) behaviors. She processed thoughts and emotions. She asked to follow up. She denied suicidal and homicidal ideation.    Interventions:  Worked on developing a therapeutic relationship with the patient using active listening and reflective statements. Provided emotional support using empathy and validation. Reviewed the treatment plan with the patient. Reviewed family trip to the beach. Normalized and validated expressed thoughts and emotions. Identified goals for the session. Provided psychoeducation about social learning theory, projective  testing, learned behaviors, sexual abuse, and the LGBTQ community. Assessed for suicidal and homicidal ideation.   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, single episode, moderate   Plan:   Goals Alleviate depressive symptoms Recognize, accept, and cope with depressive feelings Develop healthy thinking patterns Develop healthy interpersonal relationships  Objectives target date for all objectives is 12/17/2022 Cooperate with a medication evaluation by a physician Verbalize an accurate understanding of depression Verbalize an understanding of the treatment Identify and replace thoughts that support depression Learn and implement behavioral strategies Verbalize an understanding and resolution of current interpersonal problems Learn and implement problem solving and decision making skills Learn and implement conflict resolution skills to resolve interpersonal problems Verbalize an understanding of healthy and unhealthy emotions verbalize insight into how past relationships may be influence current experiences with depression Use mindfulness and acceptance strategies and increase value based behavior  Increase hopeful statements about the future.  Interventions Consistent with treatment model, discuss how change in cognitive, behavioral, and interpersonal can help client alleviate depression CBT Behavioral activation help the client explore the relationship, nature of the dispute,  Help the client develop new interpersonal skills and relationships Conduct Problem so living therapy Teach conflict resolution skills Use a process-experiential approach Conduct TLDP Conduct ACT Evaluate need for psychotropic medication Monitor adherence to medication   The patient and clinician reviewed the treatment plan on 01/08/2022. The patient approved of the treatment plan.   Hilbert Corrigan, PsyD

## 2022-02-10 ENCOUNTER — Encounter: Payer: Self-pay | Admitting: Family Medicine

## 2022-02-12 ENCOUNTER — Ambulatory Visit: Payer: No Typology Code available for payment source | Admitting: Psychologist

## 2022-02-12 DIAGNOSIS — F321 Major depressive disorder, single episode, moderate: Secondary | ICD-10-CM

## 2022-02-12 NOTE — Progress Notes (Signed)
[facility] Counselor/Therapist Progress Note  Patient ID: Traci Harper, MRN: [id number],    Date: 02/12/2022  Time Spent: 08:07 am to 08:47 am; total time: 40 minutes   This session was held via in person. The patient consented to in-person therapy and was in the clinician's office. Limits of confidentiality were discussed with the patient.   Treatment Type: Individual Therapy  Reported Symptoms: Distress related to family dynamics  Mental Status Exam: Appearance:  Well Groomed     Behavior: Appropriate  Motor: Normal  Speech/Language:  Clear and Coherent  Affect: Appropriate  Mood: normal  Thought process: normal  Thought content:   WNL  Sensory/Perceptual disturbances:   WNL  Orientation: oriented to person, place, and time/date  Attention: Good  Concentration: Good  Memory: WNL  Fund of knowledge:  Good  Insight:   Fair  Judgment:  Good  Impulse Control: Good   Risk Assessment: Danger to Self:  No Self-injurious Behavior: No Danger to Others: No Duty to Warn:no Physical Aggression / Violence:No  Access to Firearms a concern: No  Gang Involvement:No   Subjective: Beginning the session, patient described herself as doing well while reflecting on trip to the beach. After reviewing the treatment plan, patient spent the session talking about the challenging family dynamics. She processed thoughts and emotions. She asked to follow up. She denied suicidal and homicidal ideation.    Interventions:  Worked on developing a therapeutic relationship with the patient using active listening and reflective statements. Provided emotional support using empathy and validation. Used socratic questions to assist the patient gain insight into self. Explored the family dynamics. Processed thoughts and emotions. Challenged some of the thoughts expressed. Used metaphors to assist the patient gain insight into self. Used-self disclosure. Processed how the therapeutic  relationship is like the relationship patient has with her grandchild. Assessed for suicidal and homicidal ideation.   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, single episode, moderate   Plan:   Goals Alleviate depressive symptoms Recognize, accept, and cope with depressive feelings Develop healthy thinking patterns Develop healthy interpersonal relationships  Objectives target date for all objectives is 12/17/2022 Cooperate with a medication evaluation by a physician Verbalize an accurate understanding of depression Verbalize an understanding of the treatment Identify and replace thoughts that support depression Learn and implement behavioral strategies Verbalize an understanding and resolution of current interpersonal problems Learn and implement problem solving and decision making skills Learn and implement conflict resolution skills to resolve interpersonal problems Verbalize an understanding of healthy and unhealthy emotions verbalize insight into how past relationships may be influence current experiences with depression Use mindfulness and acceptance strategies and increase value based behavior  Increase hopeful statements about the future.  Interventions Consistent with treatment model, discuss how change in cognitive, behavioral, and interpersonal can help client alleviate depression CBT Behavioral activation help the client explore the relationship, nature of the dispute,  Help the client develop new interpersonal skills and relationships Conduct Problem so living therapy Teach conflict resolution skills Use a process-experiential approach Conduct TLDP Conduct ACT Evaluate need for psychotropic medication Monitor adherence to medication   The patient and clinician reviewed the treatment plan on 01/08/2022. The patient approved of the treatment plan.   Hilbert Corrigan, PsyD

## 2022-02-24 ENCOUNTER — Encounter: Payer: Self-pay | Admitting: *Deleted

## 2022-03-05 ENCOUNTER — Ambulatory Visit: Payer: No Typology Code available for payment source | Admitting: Psychologist

## 2022-03-11 ENCOUNTER — Ambulatory Visit: Payer: No Typology Code available for payment source | Admitting: Family

## 2022-03-11 ENCOUNTER — Encounter: Payer: Self-pay | Admitting: Family

## 2022-03-11 ENCOUNTER — Telehealth: Payer: Self-pay | Admitting: Family Medicine

## 2022-03-11 VITALS — BP 140/98 | HR 80 | Temp 97.5°F | Ht 63.0 in

## 2022-03-11 DIAGNOSIS — L03818 Cellulitis of other sites: Secondary | ICD-10-CM | POA: Diagnosis not present

## 2022-03-11 MED ORDER — DOXYCYCLINE HYCLATE 100 MG PO TABS: 100.0000 mg | ORAL_TABLET | Freq: Two times a day (BID) | ORAL | 0 refills | Status: DC

## 2022-03-11 NOTE — Progress Notes (Signed)
Patient ID: Traci Harper, female    DOB: Jan 28, 1956, 66 y.o.   MRN: [id number]  Chief Complaint  Patient presents with   Foot Pain    Pt c/o left foot wound, Present fora couple of days. Red, swollen, painful and warm to touch. She is diabetic.    HPI:      Left foot pain:  pt has swelling, pain, warmth and redness in left ankle and foot. She also reports a thick callous on left heel. She had a pedicure 2 weeks ago and they did scrape her feet, but she states current sx started about 2 days ago.  She denies any known puncture injury or insect bite, but she has neuropathy in both feet, and walks at home with bare feet.  Denies fever or any drainage from the foot.  Assessment & Plan:  1. Cellulitis of other specified site left foot/ankle - pt has calloused area on left corrner of heel that is dark, slightly spongy, very tender to touch, but all of ankle and foot are tender to touch and pt reports throbbing pain even when not applying pressure. Sending DOXY, advised on use & SE, advised to elevate leg whenever resting, stay off of foot as much as possible, sending urgent referral to podiatry,  - doxycycline (VIBRA-TABS) 100 MG tablet; Take 1 tablet (100 mg total) by mouth 2 (two) times daily for 7 days.  Dispense: 14 tablet; Refill: 0   Subjective:    Outpatient Medications Prior to Visit  Medication Sig Dispense Refill   Ascorbic Acid (VITAMIN C) 100 MG tablet Take 100 mg by mouth daily.     ascorbic acid (VITAMIN C) 1000 MG tablet Take 1 tablet by mouth daily.     Blood Glucose Monitoring Suppl (ONETOUCH VERIO FLEX SYSTEM) w/Device KIT USE   TO CHECK GLUCOSE FOR BLOOD SUGAR 1 kit 3   budesonide-formoterol (SYMBICORT) 160-4.5 MCG/ACT inhaler Inhale 2 puffs into the lungs in the morning and at bedtime. 10.2 g 0   calcium citrate-vitamin D (CITRACAL+D) 315-200 MG-UNIT tablet Take 1 tablet by mouth 2 (two) times daily.     Dulaglutide (TRULICITY) 1.5 HD/6.2IW SOPN Inject 1.5 mg into the  skin once a week. 6 mL 3   ELIQUIS 5 MG TABS tablet Take 1 tablet (5 mg total) by mouth 2 (two) times daily. 60 tablet 5   glucose blood test strip 1 each by Other route as needed for other. Use as instructed 100 each 1   levothyroxine (SYNTHROID) 150 MCG tablet Take 1 tablet (150 mcg total) by mouth daily. 90 tablet 3   magnesium oxide (MAG-OX) 400 MG tablet Take 1 tablet (400 mg total) by mouth 2 (two) times daily. 180 tablet 3   metFORMIN (GLUCOPHAGE) 1000 MG tablet Take 1 tablet (1,000 mg total) by mouth 2 (two) times daily with a meal. 180 tablet 3   metoprolol tartrate (LOPRESSOR) 50 MG tablet TAKE 1 TABLET BY MOUTH TWICE A DAY 180 tablet 1   Multiple Vitamin (ONE DAILY) tablet Take 1 tablet by mouth daily.      mycophenolate (MYFORTIC) 360 MG TBEC Take 360 mg by mouth 2 (two) times daily.      olmesartan (BENICAR) 5 MG tablet Take 1 tablet (5 mg total) by mouth daily. 90 tablet 1   omeprazole (PRILOSEC) 20 MG capsule Take 1 capsule (20 mg total) by mouth daily. 90 capsule 3   PROGRAF 0.5 MG capsule Take 0.5 mg by mouth. Two tablets in  the morning and 1 tablet in the evening  10   promethazine-dextromethorphan (PROMETHAZINE-DM) 6.25-15 MG/5ML syrup Take 5 mLs by mouth 4 (four) times daily as needed for cough. 180 mL 0   No facility-administered medications prior to visit.   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 40   Thyroid disease    Past Surgical History:  Procedure Laterality Date   APPENDECTOMY     BACK SURGERY     BREAST BIOPSY Right    CESAREAN SECTION     x2   CHOLECYSTECTOMY     DILATION AND CURETTAGE OF UTERUS     at [place]     LIVER TRANSPLANT  09/17/2012   _0  [place]   THYROID SURGERY     x2   TUBAL LIGATION     V-TACH ABLATION     Allergies  Allergen Reactions    Lisinopril Cough      Objective:    Physical Exam Vitals and nursing note reviewed.  Constitutional:      Appearance: Normal appearance.  Cardiovascular:     Rate and Rhythm: Normal rate and regular rhythm.  Pulmonary:     Effort: Pulmonary effort is normal.     Breath sounds: Normal breath sounds.  Musculoskeletal:        General: Normal range of motion.       Feet:  Feet:     Left foot:     Skin integrity: Erythema (ankle and foot), warmth (ankle and foot) and callus (Very thick on left heel - see diagram above - slight spongy feel - dark discoloration underneath) present.  Skin:    General: Skin is warm and dry.  Neurological:     Mental Status: She is alert.  Psychiatric:        Mood and Affect: Mood normal.        Behavior: Behavior normal.    BP (!) 140/98 (BP Location: Left Arm, Patient Position: Sitting, Cuff Size: Large)   Pulse 80   Temp (!) 97.5 F (36.4 C) (Temporal)   Ht _1  (1.6 m)   SpO2 95%   BMI 41.70 kg/m  Wt Readings from Last 3 Encounters:  10/01/21 235 lb 6.4 oz (106.8 kg)  04/02/21 237 lb (107.5 kg)  10/22/20 227 lb 6.4 oz (103.1 kg)       Jeanie Sewer, NP

## 2022-03-11 NOTE — Telephone Encounter (Signed)
Patient states she is Diabetic and has either a cut or a crack on the heel of her left foot and that she is in pain and affected area is very red from left foot to top of left ankle bone which is also swollen. Behind ankle bone is warm to the touch.  Transferred to Triage.

## 2022-03-11 NOTE — Telephone Encounter (Signed)
Routing to PCP Team and [facility], FNP for 03/11/22 appt.  Patient Name: Traci Harper Gender: Female DOB: 05-24-1956 Age: 66 Y 15 D Return Phone Number: [phone] (Primary) Address: City/ State/ Zip: [place]  [address] Client [facility] Provider Dimas Chyle- MD Contact Type Call Who Is Calling Patient / Member / Family / Caregiver Call Type Triage / Clinical Relationship To Patient Self Return Phone Number Please choose phone number Chief Complaint Leg Swelling And Edema Reason for Call Symptomatic / Request for [place] says that she is diabetic. The heal of her left foot is swollen behind the ankle bone, and painful. Site is red from the left to the top of the ankle bone, and it is warm to the touch behind the ankle bone Translation No Nurse Assessment Nurse: D'Heur Lucia Gaskins, RN, Adrienne Date/Time (Eastern Time): 03/11/2022 10:35:41 AM Confirm and document reason for call. If symptomatic, describe symptoms. ---Caller says that she is diabetic. The heel of her left foot is swollen behind the ankle bone, and painful. Site is red from the left to the top of the ankle bone, and it is warm to the touch behind the ankle bone. She also has a cut or a crack. Symptoms started a couple of days ago. She is hobbling. Does the patient have any new or worsening symptoms? ---Yes Will a triage be completed? ---Yes Related visit to physician within the last 2 weeks? ---No Does the PT have any chronic conditions? (i.e. diabetes, asthma, this includes High risk factors for pregnancy, etc.) ---Yes List chronic conditions. ---Diabetes, hypothyroid, hx of liver transplant, hx of DVT & PE. Is this a behavioral health or substance abuse call? ---No  Guidelines Guideline Title Affirmed Question Affirmed Notes Nurse Date/Time Eilene Ghazi Time) Leg Swelling  and Edema [1] Red area or streak [2] large (> 2 in. or 5 cm) D'Heur Lucia Gaskins, RN, Adrienne 03/11/2022 10:39:47 AM Disp. Time Eilene Ghazi Time) Disposition Final User 03/11/2022 10:44:08 AM See HCP within 4 Hours (or PCP triage) Yes D'Heur Lucia Gaskins, RN, Adrienne Final Disposition 03/11/2022 10:44:08 AM See HCP within 4 Hours (or PCP triage) Yes D'Heur Lucia Gaskins, RN, Ebbie Latus Disagree/Comply Comply Caller Understands Yes PreDisposition Call Doctor Care Advice Given Per Guideline SEE HCP (OR PCP TRIAGE) WITHIN 4 HOURS: * IF OFFICE WILL BE OPEN: You need to be seen within the next 3 or 4 hours. Call your doctor (or NP/PA) now or as soon as the office opens. CALL BACK IF: * You become worse CARE ADVICE given per Leg Swelling and Edema (Adult) guideline. Referrals REFERRED TO PCP OFFICE

## 2022-03-12 ENCOUNTER — Inpatient Hospital Stay (HOSPITAL_COMMUNITY)
Admission: EM | Admit: 2022-03-12 | Discharge: 2022-03-16 | DRG: 603 | Disposition: A | Discharge status: Home Health | Payer: 59 | Attending: Family Medicine | Admitting: Family Medicine

## 2022-03-12 ENCOUNTER — Emergency Department (HOSPITAL_COMMUNITY): Payer: 59

## 2022-03-12 ENCOUNTER — Other Ambulatory Visit: Payer: Self-pay

## 2022-03-12 ENCOUNTER — Encounter (HOSPITAL_COMMUNITY): Payer: Self-pay | Admitting: Pharmacy Technician

## 2022-03-12 DIAGNOSIS — L03818 Cellulitis of other sites: Secondary | ICD-10-CM

## 2022-03-12 DIAGNOSIS — Z7969 Long term (current) use of other immunomodulators and immunosuppressants: Secondary | ICD-10-CM

## 2022-03-12 DIAGNOSIS — Z7951 Long term (current) use of inhaled steroids: Secondary | ICD-10-CM

## 2022-03-12 DIAGNOSIS — Z79890 Hormone replacement therapy (postmenopausal): Secondary | ICD-10-CM

## 2022-03-12 DIAGNOSIS — L02612 Cutaneous abscess of left foot: Secondary | ICD-10-CM | POA: Diagnosis not present

## 2022-03-12 DIAGNOSIS — Z944 Liver transplant status: Secondary | ICD-10-CM

## 2022-03-12 DIAGNOSIS — Z888 Allergy status to other drugs, medicaments and biological substances status: Secondary | ICD-10-CM

## 2022-03-12 DIAGNOSIS — Z7901 Long term (current) use of anticoagulants: Secondary | ICD-10-CM

## 2022-03-12 DIAGNOSIS — N1831 Chronic kidney disease, stage 3a: Secondary | ICD-10-CM | POA: Diagnosis present

## 2022-03-12 DIAGNOSIS — E039 Hypothyroidism, unspecified: Secondary | ICD-10-CM | POA: Diagnosis present

## 2022-03-12 DIAGNOSIS — L03116 Cellulitis of left lower limb: Principal | ICD-10-CM | POA: Diagnosis present

## 2022-03-12 DIAGNOSIS — Z87891 Personal history of nicotine dependence: Secondary | ICD-10-CM

## 2022-03-12 DIAGNOSIS — E1169 Type 2 diabetes mellitus with other specified complication: Secondary | ICD-10-CM | POA: Diagnosis present

## 2022-03-12 DIAGNOSIS — E1122 Type 2 diabetes mellitus with diabetic chronic kidney disease: Secondary | ICD-10-CM | POA: Diagnosis present

## 2022-03-12 DIAGNOSIS — M549 Dorsalgia, unspecified: Secondary | ICD-10-CM | POA: Diagnosis present

## 2022-03-12 DIAGNOSIS — L089 Local infection of the skin and subcutaneous tissue, unspecified: Secondary | ICD-10-CM | POA: Diagnosis present

## 2022-03-12 DIAGNOSIS — Z79899 Other long term (current) drug therapy: Secondary | ICD-10-CM

## 2022-03-12 DIAGNOSIS — E875 Hyperkalemia: Secondary | ICD-10-CM | POA: Diagnosis present

## 2022-03-12 DIAGNOSIS — E1165 Type 2 diabetes mellitus with hyperglycemia: Secondary | ICD-10-CM | POA: Diagnosis present

## 2022-03-12 DIAGNOSIS — I152 Hypertension secondary to endocrine disorders: Secondary | ICD-10-CM | POA: Diagnosis present

## 2022-03-12 DIAGNOSIS — E785 Hyperlipidemia, unspecified: Secondary | ICD-10-CM | POA: Diagnosis present

## 2022-03-12 DIAGNOSIS — Z7984 Long term (current) use of oral hypoglycemic drugs: Secondary | ICD-10-CM

## 2022-03-12 DIAGNOSIS — I2699 Other pulmonary embolism without acute cor pulmonale: Secondary | ICD-10-CM | POA: Diagnosis present

## 2022-03-12 DIAGNOSIS — Z833 Family history of diabetes mellitus: Secondary | ICD-10-CM

## 2022-03-12 DIAGNOSIS — L89626 Pressure-induced deep tissue damage of left heel: Secondary | ICD-10-CM | POA: Diagnosis present

## 2022-03-12 DIAGNOSIS — E11628 Type 2 diabetes mellitus with other skin complications: Secondary | ICD-10-CM | POA: Diagnosis present

## 2022-03-12 DIAGNOSIS — Z86711 Personal history of pulmonary embolism: Secondary | ICD-10-CM

## 2022-03-12 DIAGNOSIS — G8929 Other chronic pain: Secondary | ICD-10-CM | POA: Diagnosis present

## 2022-03-12 DIAGNOSIS — Z7985 Long-term (current) use of injectable non-insulin antidiabetic drugs: Secondary | ICD-10-CM

## 2022-03-12 DIAGNOSIS — R7401 Elevation of levels of liver transaminase levels: Secondary | ICD-10-CM | POA: Diagnosis not present

## 2022-03-12 DIAGNOSIS — Z8249 Family history of ischemic heart disease and other diseases of the circulatory system: Secondary | ICD-10-CM

## 2022-03-12 DIAGNOSIS — E871 Hypo-osmolality and hyponatremia: Secondary | ICD-10-CM | POA: Diagnosis present

## 2022-03-12 DIAGNOSIS — Z86718 Personal history of other venous thrombosis and embolism: Secondary | ICD-10-CM

## 2022-03-12 DIAGNOSIS — Z6841 Body Mass Index (BMI) 40.0 and over, adult: Secondary | ICD-10-CM

## 2022-03-12 LAB — CBC WITH DIFFERENTIAL/PLATELET
Abs Immature Granulocytes: 0.04 10*3/uL (ref 0.00–0.07)
Basophils Absolute: 0 10*3/uL (ref 0.0–0.1)
Basophils Relative: 0 %
Eosinophils Absolute: 0.1 10*3/uL (ref 0.0–0.5)
Eosinophils Relative: 1 %
HCT: 40 % (ref 36.0–46.0)
Hemoglobin: 13.6 g/dL (ref 12.0–15.0)
Immature Granulocytes: 0 %
Lymphocytes Relative: 24 %
Lymphs Abs: 2.1 10*3/uL (ref 0.7–4.0)
MCH: 35.3 pg — ABNORMAL HIGH (ref 26.0–34.0)
MCHC: 34 g/dL (ref 30.0–36.0)
MCV: 103.9 fL — ABNORMAL HIGH (ref 80.0–100.0)
Monocytes Absolute: 0.7 10*3/uL (ref 0.1–1.0)
Monocytes Relative: 7 %
Neutro Abs: 6.1 10*3/uL (ref 1.7–7.7)
Neutrophils Relative %: 68 %
Platelets: 212 10*3/uL (ref 150–400)
RBC: 3.85 MIL/uL — ABNORMAL LOW (ref 3.87–5.11)
RDW: 12.8 % (ref 11.5–15.5)
WBC: 9.1 10*3/uL (ref 4.0–10.5)
nRBC: 0 % (ref 0.0–0.2)

## 2022-03-12 LAB — COMPREHENSIVE METABOLIC PANEL
ALT: 54 U/L — ABNORMAL HIGH (ref 0–44)
AST: 58 U/L — ABNORMAL HIGH (ref 15–41)
Albumin: 3.5 g/dL (ref 3.5–5.0)
Alkaline Phosphatase: 64 U/L (ref 38–126)
Anion gap: 11 (ref 5–15)
BUN: 10 mg/dL (ref 8–23)
CO2: 28 mmol/L (ref 22–32)
Calcium: 9.5 mg/dL (ref 8.9–10.3)
Chloride: 96 mmol/L — ABNORMAL LOW (ref 98–111)
Creatinine, Ser: 1.17 mg/dL — ABNORMAL HIGH (ref 0.44–1.00)
GFR, Estimated: 52 mL/min — ABNORMAL LOW (ref >60)
Glucose, Bld: 217 mg/dL — ABNORMAL HIGH (ref 70–99)
Potassium: 5.3 mmol/L — ABNORMAL HIGH (ref 3.5–5.1)
Sodium: 135 mmol/L (ref 135–145)
Total Bilirubin: 0.8 mg/dL (ref 0.3–1.2)
Total Protein: 6.5 g/dL (ref 6.5–8.1)

## 2022-03-12 NOTE — ED Provider Triage Note (Signed)
Emergency Medicine Provider Triage Evaluation Note  Traci Harper , a 66 y.o. female  was evaluated in triage.  Pt complains of left foot infection.  Patient history of diabetes.  Unsure if she stepped on something or had dry cracked foot.  Patient reports worsening swelling, redness and a bulla that has erupted on her left heel.  Patient report associated swelling.  She is a liver transplant patient who is on antirejection medications as well.  No fevers, vomiting..  Review of Systems  Positive: Erythema, swelling, pain Negative: Fever, chills  Physical Exam  BP (!) 146/84 (BP Location: Right Arm)   Pulse 79   Temp 98.5 F (36.9 C) (Oral)   Resp 15   SpO2 98%  Gen:   Awake, no distress   Resp:  Normal effort  MSK:   Moves extremities without difficulty  Other:  Patient with associated erythema, warmth, swelling and bulla to the left heel.  This radiates up to the left mid lower leg.  DP pulses 2+.  Cap refill normal.  Medical Decision Making  Medically screening exam initiated at 1:00 PM.  Appropriate orders placed.  Traci Harper was informed that the remainder of the evaluation will be completed by another provider, this initial triage assessment does not replace that evaluation, and the importance of remaining in the ED until their evaluation is complete.  Patient with concern for cellulitis in the setting of diabetes.  Patient has no systemic signs of illness at this time.  Basic labs and x-ray meeting pending at this time to assess for subcutaneous gas.   Doristine Devoid, PA-C 03/12/22 1302

## 2022-03-12 NOTE — ED Triage Notes (Signed)
Pt here with reports of having a cut to the bottom of her L foot several days ago. Since then, increased redness, swelling and pain. Went to PCP yesterday and started on abx. Hx diabetes.

## 2022-03-13 ENCOUNTER — Emergency Department (HOSPITAL_COMMUNITY): Payer: 59

## 2022-03-13 ENCOUNTER — Observation Stay (HOSPITAL_BASED_OUTPATIENT_CLINIC_OR_DEPARTMENT_OTHER): Payer: 59

## 2022-03-13 DIAGNOSIS — E1159 Type 2 diabetes mellitus with other circulatory complications: Secondary | ICD-10-CM

## 2022-03-13 DIAGNOSIS — L03116 Cellulitis of left lower limb: Secondary | ICD-10-CM | POA: Diagnosis not present

## 2022-03-13 DIAGNOSIS — E11628 Type 2 diabetes mellitus with other skin complications: Secondary | ICD-10-CM

## 2022-03-13 DIAGNOSIS — R7401 Elevation of levels of liver transaminase levels: Secondary | ICD-10-CM | POA: Diagnosis not present

## 2022-03-13 DIAGNOSIS — Z944 Liver transplant status: Secondary | ICD-10-CM

## 2022-03-13 DIAGNOSIS — L089 Local infection of the skin and subcutaneous tissue, unspecified: Secondary | ICD-10-CM

## 2022-03-13 DIAGNOSIS — E1165 Type 2 diabetes mellitus with hyperglycemia: Secondary | ICD-10-CM

## 2022-03-13 DIAGNOSIS — L0390 Cellulitis, unspecified: Secondary | ICD-10-CM

## 2022-03-13 DIAGNOSIS — L97422 Non-pressure chronic ulcer of left heel and midfoot with fat layer exposed: Secondary | ICD-10-CM | POA: Diagnosis not present

## 2022-03-13 DIAGNOSIS — I152 Hypertension secondary to endocrine disorders: Secondary | ICD-10-CM

## 2022-03-13 DIAGNOSIS — I2699 Other pulmonary embolism without acute cor pulmonale: Secondary | ICD-10-CM

## 2022-03-13 LAB — CBG MONITORING, ED
Glucose-Capillary: 209 mg/dL — ABNORMAL HIGH (ref 70–99)
Glucose-Capillary: 241 mg/dL — ABNORMAL HIGH (ref 70–99)
Glucose-Capillary: 212 mg/dL — ABNORMAL HIGH (ref 70–99)

## 2022-03-13 LAB — BASIC METABOLIC PANEL WITH GFR
Anion gap: 15 (ref 5–15)
BUN: 15 mg/dL (ref 8–23)
CO2: 20 mmol/L — ABNORMAL LOW (ref 22–32)
Calcium: 8.6 mg/dL — ABNORMAL LOW (ref 8.9–10.3)
Chloride: 96 mmol/L — ABNORMAL LOW (ref 98–111)
Creatinine, Ser: 1.26 mg/dL — ABNORMAL HIGH (ref 0.44–1.00)
GFR, Estimated: 48 mL/min — ABNORMAL LOW
Glucose, Bld: 250 mg/dL — ABNORMAL HIGH (ref 70–99)
Potassium: 4.4 mmol/L (ref 3.5–5.1)
Sodium: 131 mmol/L — ABNORMAL LOW (ref 135–145)

## 2022-03-13 LAB — CBC
HCT: 39.2 % (ref 36.0–46.0)
Hemoglobin: 12.9 g/dL (ref 12.0–15.0)
MCH: 35.2 pg — ABNORMAL HIGH (ref 26.0–34.0)
MCHC: 32.9 g/dL (ref 30.0–36.0)
MCV: 107.1 fL — ABNORMAL HIGH (ref 80.0–100.0)
Platelets: 193 K/uL (ref 150–400)
RBC: 3.66 MIL/uL — ABNORMAL LOW (ref 3.87–5.11)
RDW: 12.7 % (ref 11.5–15.5)
WBC: 8.7 K/uL (ref 4.0–10.5)
nRBC: 0 % (ref 0.0–0.2)

## 2022-03-13 LAB — SEDIMENTATION RATE: Sed Rate: 18 mm/h (ref 0–22)

## 2022-03-13 LAB — HEMOGLOBIN A1C
Hgb A1c MFr Bld: 8 % — ABNORMAL HIGH (ref 4.8–5.6)
Mean Plasma Glucose: 182.9 mg/dL

## 2022-03-13 LAB — PREALBUMIN: Prealbumin: 22 mg/dL (ref 18–38)

## 2022-03-13 LAB — MRSA NEXT GEN BY PCR, NASAL: MRSA by PCR Next Gen: NOT DETECTED

## 2022-03-13 LAB — GLUCOSE, CAPILLARY: Glucose-Capillary: 171 mg/dL — ABNORMAL HIGH (ref 70–99)

## 2022-03-13 LAB — C-REACTIVE PROTEIN: CRP: 10.9 mg/dL — ABNORMAL HIGH (ref <1.0)

## 2022-03-13 MED ORDER — ONDANSETRON HCL 4 MG PO TABS: 4.0000 mg | ORAL_TABLET | Freq: Four times a day (QID) | ORAL | Status: DC | PRN

## 2022-03-13 MED ORDER — TACROLIMUS 0.5 MG PO CAPS
0.5000 mg | ORAL_CAPSULE | Freq: Two times a day (BID) | ORAL | Status: DC
  Administered 2022-03-13 – 2022-03-16 (×7): 0.5 mg via ORAL
  Filled 2022-03-13 (×9): qty 1

## 2022-03-13 MED ORDER — DULAGLUTIDE 1.5 MG/0.5ML ~~LOC~~ SOAJ: 1.5000 mg | SUBCUTANEOUS | Status: DC

## 2022-03-13 MED ORDER — LEVOTHYROXINE SODIUM 75 MCG PO TABS
150.0000 ug | ORAL_TABLET | Freq: Every day | ORAL | Status: DC
  Administered 2022-03-13 – 2022-03-16 (×4): 150 ug via ORAL
  Filled 2022-03-13 (×4): qty 2

## 2022-03-13 MED ORDER — MORPHINE SULFATE (PF) 4 MG/ML IV SOLN
4.0000 mg | Freq: Once | INTRAVENOUS | Status: AC
  Administered 2022-03-13: 4 mg via INTRAVENOUS
  Filled 2022-03-13: qty 1

## 2022-03-13 MED ORDER — MYCOPHENOLATE SODIUM 180 MG PO TBEC
360.0000 mg | DELAYED_RELEASE_TABLET | Freq: Two times a day (BID) | ORAL | Status: DC
  Administered 2022-03-13 – 2022-03-16 (×7): 360 mg via ORAL
  Filled 2022-03-13 (×8): qty 2

## 2022-03-13 MED ORDER — ONDANSETRON HCL 4 MG/2ML IJ SOLN: 4.0000 mg | Freq: Four times a day (QID) | INTRAMUSCULAR | Status: DC | PRN

## 2022-03-13 MED ORDER — ACETAMINOPHEN 650 MG RE SUPP: 650.0000 mg | Freq: Four times a day (QID) | RECTAL | Status: DC | PRN

## 2022-03-13 MED ORDER — SODIUM CHLORIDE 0.9 % IV SOLN
2.0000 g | Freq: Once | INTRAVENOUS | Status: AC
  Administered 2022-03-13: 2 g via INTRAVENOUS
  Filled 2022-03-13: qty 12.5

## 2022-03-13 MED ORDER — METOPROLOL TARTRATE 50 MG PO TABS
50.0000 mg | ORAL_TABLET | Freq: Two times a day (BID) | ORAL | Status: DC
  Administered 2022-03-13 – 2022-03-16 (×7): 50 mg via ORAL
  Filled 2022-03-13 (×6): qty 1
  Filled 2022-03-13: qty 2

## 2022-03-13 MED ORDER — VANCOMYCIN HCL IN DEXTROSE 1-5 GM/200ML-% IV SOLN: 1000.0000 mg | Freq: Once | INTRAVENOUS | Status: DC

## 2022-03-13 MED ORDER — ENOXAPARIN SODIUM 40 MG/0.4ML IJ SOSY: 40.0000 mg | PREFILLED_SYRINGE | INTRAMUSCULAR | Status: DC

## 2022-03-13 MED ORDER — IOHEXOL 350 MG/ML SOLN
70.0000 mL | Freq: Once | INTRAVENOUS | Status: AC | PRN
  Administered 2022-03-13: 70 mL via INTRAVENOUS

## 2022-03-13 MED ORDER — APIXABAN 5 MG PO TABS
5.0000 mg | ORAL_TABLET | Freq: Two times a day (BID) | ORAL | Status: DC
  Administered 2022-03-13 – 2022-03-16 (×7): 5 mg via ORAL
  Filled 2022-03-13 (×7): qty 1

## 2022-03-13 MED ORDER — INSULIN ASPART 100 UNIT/ML IJ SOLN
0.0000 [IU] | Freq: Three times a day (TID) | INTRAMUSCULAR | Status: DC
  Administered 2022-03-13 – 2022-03-14 (×5): 5 [IU] via SUBCUTANEOUS
  Administered 2022-03-14: 3 [IU] via SUBCUTANEOUS
  Administered 2022-03-15 – 2022-03-16 (×4): 5 [IU] via SUBCUTANEOUS
  Administered 2022-03-16: 8 [IU] via SUBCUTANEOUS

## 2022-03-13 MED ORDER — OXYCODONE HCL 5 MG PO TABS
5.0000 mg | ORAL_TABLET | ORAL | Status: DC | PRN
  Administered 2022-03-13 – 2022-03-16 (×6): 5 mg via ORAL
  Filled 2022-03-13 (×6): qty 1

## 2022-03-13 MED ORDER — SODIUM CHLORIDE 0.9 % IV SOLN: 2.0000 g | Freq: Once | INTRAVENOUS | Status: DC

## 2022-03-13 MED ORDER — ACETAMINOPHEN 325 MG PO TABS
650.0000 mg | ORAL_TABLET | Freq: Four times a day (QID) | ORAL | Status: DC | PRN
  Administered 2022-03-13 – 2022-03-15 (×2): 650 mg via ORAL
  Filled 2022-03-13 (×2): qty 2

## 2022-03-13 MED ORDER — SODIUM CHLORIDE 0.9 % IV SOLN
2.0000 g | Freq: Two times a day (BID) | INTRAVENOUS | Status: DC
  Administered 2022-03-13 – 2022-03-16 (×6): 2 g via INTRAVENOUS
  Filled 2022-03-13 (×6): qty 12.5

## 2022-03-13 MED ORDER — VANCOMYCIN HCL IN DEXTROSE 1-5 GM/200ML-% IV SOLN
1000.0000 mg | INTRAVENOUS | Status: DC
  Administered 2022-03-14 – 2022-03-16 (×3): 1000 mg via INTRAVENOUS
  Filled 2022-03-13 (×4): qty 200

## 2022-03-13 MED ORDER — VANCOMYCIN HCL IN DEXTROSE 1-5 GM/200ML-% IV SOLN
1000.0000 mg | Freq: Once | INTRAVENOUS | Status: AC
Start: 2022-03-13 — End: 2022-03-13
  Administered 2022-03-13: 1000 mg via INTRAVENOUS
  Filled 2022-03-13: qty 200

## 2022-03-13 MED ORDER — IRBESARTAN 75 MG PO TABS
37.5000 mg | ORAL_TABLET | Freq: Every day | ORAL | Status: DC
  Administered 2022-03-13 – 2022-03-16 (×4): 37.5 mg via ORAL
  Filled 2022-03-13 (×4): qty 0.5

## 2022-03-13 MED ORDER — CEFAZOLIN SODIUM-DEXTROSE 2-4 GM/100ML-% IV SOLN
2.0000 g | Freq: Three times a day (TID) | INTRAVENOUS | Status: DC
  Administered 2022-03-13: 2 g via INTRAVENOUS
  Filled 2022-03-13: qty 100

## 2022-03-13 NOTE — Progress Notes (Signed)
PROGRESS NOTE    Traci Harper  ION:[id number] DOB: 1955-10-13 DOA: 03/12/2022 PCP: Ardith Dark, MD   Brief Narrative:  HPI: Traci Harper is a 66 y.o. female with medical history significant of liver transplant 2014, DM2, HTN, PEs on Eliquis.   Pt presents to the ED with c/o pain and swelling in L foot. Onset ~4 days ago coming from a callus area in heel of L foot.  Went to PCP on 10/10 who started her on doxycycline and referred her to podiatry.   Despite 2 days of doxycycline cellulitis seems to have worsened, now with erythema going up to mid calf area on front of leg and over past 24h development of a large bullae to back of ankle just above her heel (photo in physical exam below).   She says she had significant pain going up to knee though this has improved over past few hours.   She has no fevers, chills sweats.    Assessment & Plan:   Principal Problem:   Diabetic infection of left foot (HCC) Active Problems:   Liver transplanted 2014 [facility]) follows with [facility]   Hypertension associated with diabetes (HCC)   Type 2 diabetes mellitus with hyperglycemia (HCC)   Pulmonary embolism (HCC)  Diabetic foot infection left heel/possible abscess/cellulitis of the left foot and lower part of the left lower extremity: Podiatry and vascular consulted.  She received cefepime and vancomycin, I will resume those.  Wound care on board as well.  History of PE and DVTs: Continue Eliquis.  Type 2 diabetes mellitus: Hold metformin.  Continue Trulicity and SSI.  Essential hypertension: Elevated.  Continue metoprolol and resume Benicar.  Acute hyponatremia: Monitor for now.  She is asymptomatic.  CKD stage IIIa: At baseline.  Hyperkalemia: Resolved.  Acquired hypothyroidism: Continue Synthroid.  Liver transplanted 2014 [facility]) follows with [facility]: Very mild transaminitis with normal bilirubin, LFTs actually look improved  compared to in May of this year when she last saw transplant  hepatologist.  Work up at that time only showed fatty liver as far as the liver was concerned (incedentally the CT scan that time also found small amount of pulmonary emboli for which she was started on eliquis). Continue myfortic and prograf (confirmed dosing with patient). Patient is not on prednisone and hasnt been since shortly after surgery she states Follow up with transplant hepatologist.  DVT prophylaxis:   Eliquis   Code Status: Full Code  Family Communication:  None present at bedside.  Plan of care discussed with patient in length and he/she verbalized understanding and agreed with it.  Status is: Observation The patient will require care spanning > 2 midnights and should be moved to inpatient because: She is likely going to need incision and drainage of the possible abscess of the left heel.   Estimated body mass index is 41.7 kg/m as calculated from the following:   Height as of 03/11/22: 5\' 3"  (1.6 m).   Weight as of 10/01/21: 106.8 kg.    Nutritional Assessment: There is no height or weight on file to calculate BMI.. Seen by dietician.  I agree with the assessment and plan as outlined below: Nutrition Status:        . Skin Assessment: I have examined the patient's skin and I agree with the wound assessment as performed by the wound care RN as outlined below:    Consultants:  Vascular surgery Podiatry  Procedures:  As above  Antimicrobials:  Anti-infectives (From admission, onward)  Start     Dose/Rate Route Frequency Ordered Stop   03/13/22 0600  ceFAZolin (ANCEF) IVPB 2g/100 mL premix  Status:  Discontinued        2 g 200 mL/hr over 30 Minutes Intravenous Every 8 hours 03/13/22 0415 03/13/22 0457   03/13/22 0500  vancomycin (VANCOCIN) IVPB 1000 mg/200 mL premix        1,000 mg 200 mL/hr over 60 Minutes Intravenous  Once 03/13/22 0457 03/13/22 0634   03/13/22 0500  ceFEPIme (MAXIPIME) 2 g in sodium chloride 0.9 % 100 mL IVPB        2 g 200 mL/hr over  30 Minutes Intravenous  Once 03/13/22 0457 03/13/22 0835         Subjective: Patient seen and examined the ED.  Mild pain in the left heel.  No other complaint.  Objective: Vitals:   03/13/22 0445 03/13/22 0500 03/13/22 0651 03/13/22 0835  BP: 127/66 125/72 118/76 (!) 144/78  Pulse: 92 88 92 92  Resp: 18 15 16 15   Temp:    98.4 F (36.9 C)  TempSrc:    Oral  SpO2: 96% 96% 96% 96%    Intake/Output Summary (Last 24 hours) at 03/13/2022 1020 Last data filed at 03/13/2022 0835 Gross per 24 hour  Intake 100 ml  Output --  Net 100 ml   There were no vitals filed for this visit.  Examination:  General exam: Appears calm and comfortable  Respiratory system: Clear to auscultation. Respiratory effort normal. Cardiovascular system: S1 & S2 heard, RRR. No JVD, murmurs, rubs, gallops or clicks. No pedal edema. Gastrointestinal system: Abdomen is nondistended, soft and nontender. No organomegaly or masses felt. Normal bowel sounds heard. Central nervous system: Alert and oriented. No focal neurological deficits. Extremities: Fluctuance in the left heel with black discoloration. Psychiatry: Judgement and insight appear normal. Mood & affect appropriate.    Data Reviewed: I have personally reviewed following labs and imaging studies  CBC: Recent Labs  Lab 03/12/22 1309 03/13/22 0647  WBC 9.1 8.7  NEUTROABS 6.1  --   HGB 13.6 12.9  HCT 40.0 39.2  MCV 103.9* 107.1*  PLT 212 956   Basic Metabolic Panel: Recent Labs  Lab 03/12/22 1309 03/13/22 0647  NA 135 131*  K 5.3* 4.4  CL 96* 96*  CO2 28 20*  GLUCOSE 217* 250*  BUN 10 15  CREATININE 1.17* 1.26*  CALCIUM 9.5 8.6*   GFR: CrCl cannot be calculated (Unknown ideal weight.). Liver Function Tests: Recent Labs  Lab 03/12/22 1309  AST 58*  ALT 54*  ALKPHOS 64  BILITOT 0.8  PROT 6.5  ALBUMIN 3.5   No results for input(s): "LIPASE", "AMYLASE" in the last 168 hours. No results for input(s): "AMMONIA" in the  last 168 hours. Coagulation Profile: No results for input(s): "INR", "PROTIME" in the last 168 hours. Cardiac Enzymes: No results for input(s): "CKTOTAL", "CKMB", "CKMBINDEX", "TROPONINI" in the last 168 hours. BNP (last 3 results) No results for input(s): "PROBNP" in the last 8760 hours. HbA1C: No results for input(s): "HGBA1C" in the last 72 hours. CBG: Recent Labs  Lab 03/13/22 0833  GLUCAP 209*   Lipid Profile: No results for input(s): "CHOL", "HDL", "LDLCALC", "TRIG", "CHOLHDL", "LDLDIRECT" in the last 72 hours. Thyroid Function Tests: No results for input(s): "TSH", "T4TOTAL", "FREET4", "T3FREE", "THYROIDAB" in the last 72 hours. Anemia Panel: No results for input(s): "VITAMINB12", "FOLATE", "FERRITIN", "TIBC", "IRON", "RETICCTPCT" in the last 72 hours. Sepsis Labs: No results for input(s): "PROCALCITON", "  LATICACIDVEN" in the last 168 hours.  No results found for this or any previous visit (from the past 240 hour(s)).   Radiology Studies: CT EXTREMITY LOWER LEFT W CONTRAST  Result Date: 03/13/2022 CLINICAL DATA:  66 year old diabetic female with left foot cellulitis. Increased redness pain and swelling. Rule out necrotizing fasciitis. EXAM: CT OF THE LOWER LEFT EXTREMITY WITH CONTRAST TECHNIQUE: Multidetector CT imaging of the lower left extremity was performed according to the standard protocol following intravenous contrast administration. RADIATION DOSE REDUCTION: This exam was performed according to the departmental dose-optimization program which includes automated exposure control, adjustment of the mA and/or kV according to patient size and/or use of iterative reconstruction technique. CONTRAST:  68mL OMNIPAQUE IOHEXOL 350 MG/ML SOLN COMPARISON:  Left foot radiographs 03/12/2022. FINDINGS: Calcified peripheral vascular disease. Major vascular structures in the visible left lower extremity appear to be enhancing and patent. Above the left knee soft tissues appear negative  except for muscle atrophy. Beginning at the left knee joint there is posterolateral subcutaneous swelling and stranding which become circumferential in the tib fib and continues distally. Along the left lateral calcaneus a simple fluid density superficial soft tissue swelling of up to 3.5 x 1.5 cm (series 4, image 290) is demonstrated, and this abnormal dermal appearing edema tracks laterally and posteriorly along the posterior foot as seen on series 9, image 93. Underlying subcutaneous stranding in the visible foot. But no soft tissue gas in the lower extremity. And no other fluid collection. Distal femur is intact. Patella is intact. No knee joint effusion. Tibial plateau and proximal fibula are intact. Tibia and fibula shafts appear intact. Distal tibia and fibula appear intact. Normal mortise joint alignment. Talus is intact. No ankle joint effusion. Calcaneus appears intact. No fracture or cortical osteolysis identified. IMPRESSION: 1. Posterior left foot Dermal fluid collection tracking around the posterolateral calcaneus (series 4, image 290). Underlying subcutaneous and generalized tib-fib Cellulitis. But no soft tissue gas or CT evidence of osteomyelitis. And no subcutaneous or deep fluid collection. 2. Calcified peripheral vascular disease. Electronically Signed   By: Odessa Fleming M.D.   On: 03/13/2022 05:59   DG Foot Complete Left  Result Date: 03/12/2022 CLINICAL DATA:  Left foot cellulitis.  Diabetic. EXAM: LEFT FOOT - COMPLETE 3+ VIEW COMPARISON:  None Available. FINDINGS: Moderate diffuse soft tissue edema. No signs of acute fracture or dislocation. Plantar and posterior calcaneal heel spurs noted. No bone erosions noted. IMPRESSION: 1. Moderate diffuse soft tissue edema. 2. No acute bone abnormality. Electronically Signed   By: Signa Kell M.D.   On: 03/12/2022 13:25    Scheduled Meds:  apixaban  5 mg Oral BID   Dulaglutide  1.5 mg Subcutaneous Weekly   insulin aspart  0-15 Units Subcutaneous  TID WC   levothyroxine  150 mcg Oral Q0600   metoprolol tartrate  50 mg Oral BID   mycophenolate  360 mg Oral BID   tacrolimus  0.5 mg Oral BID   Continuous Infusions:   LOS: 0 days   Hughie Closs, MD [facility]  03/13/2022, 10:20 AM   *Please note that this is a verbal dictation therefore any spelling or grammatical errors are due to the "Dragon [employer] One" system interpretation.  Please page via Amion and do not message via secure chat for urgent patient care matters. Secure chat can be used for non urgent patient care matters.  How to contact the [facility] Attending or Consulting provider 7A - 7P or covering provider during after hours 7P -  7A, for this patient?  Check the care team in [facility] and look for a) attending/consulting [facility] provider listed and b) the [facility] team listed. Page or secure chat 7A-7P. Log into [email or web address] and use [place]'s universal password to access. If you do not have the password, please contact the hospital operator. Locate the [facility] provider you are looking for under [facility] and page to a number that you can be directly reached. If you still have difficulty reaching the provider, please page the [facility] (Director on Call) for the Hospitalists listed on amion for assistance.

## 2022-03-13 NOTE — Consult Note (Signed)
PODIATRY CONSULTATION  NAME Traci Harper MRN [id number] DOB 10/17/1955 DOA 03/12/2022   Reason for consult:  Chief Complaint  Patient presents with   Foot Pain    Attending/Consulting physician: Hughie Closs MD  History of present illness: 66 y.o. female with history of liver transplant 2014, DM2, HTN, PEs on Eliquis who presents with pain redness and swelling in left ankle/leg. Onset approx 4 days ago with a small callus /fissure at the posterior plantar heel. Reports going to her PCP who placed her on doxycycline but redness and pain worsened and was referred to the ED. Has developed a large blister on the back of the heel not sure what caused it but does admit sitting in ED with shoes on with pressure on the area for a long duration of time.   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 75   Thyroid disease        Latest Ref Rng & Units 03/13/2022    6:47 AM 03/12/2022    1:09 PM 10/01/2021    8:45 AM  CBC  WBC 4.0 - 10.5 K/uL 8.7  9.1  7.1   Hemoglobin 12.0 - 15.0 g/dL 16.6  06.3  01.6   Hematocrit 36.0 - 46.0 % 39.2  40.0  41.0   Platelets 150 - 400 K/uL 193  212  226.0        Latest Ref Rng & Units 03/13/2022    6:47 AM 03/12/2022    1:09 PM 10/01/2021    8:45 AM  BMP  Glucose 70 - 99 mg/dL 010  932  355   BUN 8 - 23 mg/dL 15  10  12    Creatinine 0.44 - 1.00 mg/dL  7.32  2.02   Sodium 135 - 145 mmol/L 131  135  141   Potassium 3.5 - 5.1 mmol/L 4.4  5.3  5.3   Chloride 98 - 111 mmol/L 96  96  100   CO2 22 - 32 mmol/L 20  28  28    Calcium 8.9 - 10.3 mg/dL 8.6  9.5  9.7       Physical Exam: Lower Extremity Exam Vasc: R - PT palpable, DP palpable. Cap refill < 3 sec to digits  L - PT palpable, DP palpable. Cap refill <3 sec to digits  Derm: R - Normal temp/texture/turgor with no open lesion or clinical signs of  infection  L -  Wound: Located posterior left heel, measuring approximately 5x7 cm, probe to bone no, odor no, drainage yes, seropurulent from bullae upon drainage. Wound bed underlying bullae with mostly granular tissue with some central fibrotic tissue. Hyperkeratotic tissue margin. See pre and post I&D pictures below.        MSK:  R -  No gross deformities. Compartments soft, non-tender, compressible  L -  No gross deformities. Painful to palpation about the left heel  Neuro: R - Gross sensation present. Gross motor function intact   L - Gross sensation present. Gross motor function intact  CT LLE W contrast 10/12 Posterior left foot Dermal fluid collection tracking around the posterolateral calcaneus (series 4, image 290). Underlying subcutaneous and generalized tib-fib Cellulitis. But no soft tissue gas or CT evidence of osteomyelitis. And no subcutaneous or deep fluid collection.   ASSESSMENT/PLAN OF CARE 66 y.o.  female with PMHx significant for liver transplant 2014, DM2, HTN, PEs on Eliquis  with cellulitis of the LLE 2/2 infected friction blister/bullae posterior heel. Now s/p bedside I&D of the bullae. Underlying wound does not appear to probe to bone, overall healthy appearance of wound bed.  - s/p Left heel bullae I&D bedside with evacuation of seropurulent fluid. Continue local wound care for now, no current surgical plans - Placed xeroform dressing to left heel post debridement, likely first change Saturday AM.  - Continue IV abx broad spectrum pending further culture data. Wound culture of ulcer base underlying blister sent  Monitor for improvement in LLE cellulitis - Anticoagulation: per primary - Wound care: nonadherent dressing changes every other day - WB status: NWB to LLE  - Will continue to follow   Thank you for the consult.  Please contact me directly with any questions or concerns.           Everitt Amber, DPM [facility]     [address], [place] [address]                Office [phone]  Fax [phone]

## 2022-03-13 NOTE — Assessment & Plan Note (Signed)
1. Hold metformin 2. Continue Trulicity 3. Mod scale SSI AC

## 2022-03-13 NOTE — Assessment & Plan Note (Addendum)
Very mild transaminitis today with normal bilirubin, LFTs actually look improved today compared to in May of this year when she last saw transplant hepatologist.  Work up at that time only showed fatty liver as far as the liver was concerned (incedentally the CT scan that time also found small amount of pulmonary emboli for which she was started on eliquis). 1. Continue myfortic and prograf (confirmed dosing with patient). 2. Patient is not on prednisone and hasnt been since shortly after surgery she states 3. Follow up with transplant hepatologist.

## 2022-03-13 NOTE — Plan of Care (Signed)
  Problem: Education: Goal: Ability to describe self-care measures that may prevent or decrease complications (Diabetes Survival Skills Education) will improve Outcome: Not Progressing Goal: Individualized Educational Video(s) Outcome: Not Progressing   Problem: Coping: Goal: Ability to adjust to condition or change in health will improve Outcome: Not Progressing   Problem: Fluid Volume: Goal: Ability to maintain a balanced intake and output will improve Outcome: Not Progressing   Problem: Health Behavior/Discharge Planning: Goal: Ability to identify and utilize available resources and services will improve Outcome: Not Progressing   Problem: Metabolic: Goal: Ability to maintain appropriate glucose levels will improve Outcome: Not Progressing   Problem: Nutritional: Goal: Maintenance of adequate nutrition will improve Outcome: Not Progressing Goal: Progress toward achieving an optimal weight will improve Outcome: Not Progressing   Problem: Skin Integrity: Goal: Risk for impaired skin integrity will decrease Outcome: Not Progressing   Problem: Tissue Perfusion: Goal: Adequacy of tissue perfusion will improve Outcome: Not Progressing   Problem: Clinical Measurements: Goal: Ability to maintain clinical measurements within normal limits will improve Outcome: Not Progressing   Problem: Pain Managment: Goal: General experience of comfort will improve Outcome: Not Progressing   Problem: Safety: Goal: Ability to remain free from injury will improve Outcome: Not Progressing   Problem: Skin Integrity: Goal: Risk for impaired skin integrity will decrease Outcome: Not Progressing

## 2022-03-13 NOTE — H&P (Signed)
History and Physical    Patient: Traci Harper HQR:[id number] DOB: Jul 06, 1955 DOA: 03/12/2022 DOS: the patient was seen and examined on 03/13/2022 PCP: Vivi Barrack, MD  Patient coming from: Home  Chief Complaint:  Chief Complaint  Patient presents with   Foot Pain   HPI: Traci Harper is a 66 y.o. female with medical history significant of liver transplant 2014, DM2, HTN, PEs on Eliquis.  Pt presents to the ED with c/o pain and swelling in L foot. Onset ~4 days ago coming from a callus area in heel of L foot.  Went to PCP on 10/10 who started her on doxycycline and referred her to podiatry.  Despite 2 days of doxycycline cellulitis seems to have worsened, now with erythema going up to mid calf area on front of leg and over past 24h development of a large bullae to back of ankle just above her heel (photo in physical exam below).  She says she had significant pain going up to knee though this has improved over past few hours.  She has no fevers, chills sweats.   Review of Systems: As mentioned in the history of present illness. All other systems reviewed and are negative. 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 37   Thyroid disease    Past Surgical History:  Procedure Laterality Date   APPENDECTOMY     BACK SURGERY     BREAST BIOPSY Right    CESAREAN SECTION     x2   CHOLECYSTECTOMY     DILATION AND CURETTAGE OF UTERUS     at [place]     LIVER TRANSPLANT  09/17/2012   @[email or web address]   THYROID SURGERY     x2   TUBAL LIGATION     V-TACH ABLATION     Social History:  reports that she quit smoking about 19 years ago. Her smoking use included cigarettes. She does not have any smokeless tobacco history on file. She reports that she does not drink alcohol and does not use  drugs.  Allergies  Allergen Reactions   Lisinopril Cough    Family History  Problem Relation Age of Onset   Diabetes Mother    Heart disease Father    Diabetes Father     Prior to Admission medications   Medication Sig Start Date End Date Taking? Authorizing Provider  Ascorbic Acid (VITAMIN C) 100 MG tablet Take 100 mg by mouth daily.   Yes [provider]  calcium citrate-vitamin D (CITRACAL+D) 315-200 MG-UNIT tablet Take 1 tablet by mouth daily.   Yes [provider]  doxycycline (VIBRA-TABS) 100 MG tablet Take 1 tablet (100 mg total) by mouth 2 (two) times daily for 7 days. 03/11/22 03/18/22 Yes Hudnell, Colletta [place], NP  Dulaglutide (TRULICITY) 1.5 DI/2.6EB SOPN Inject 1.5 mg into the skin once a week. Patient taking differently: Inject 1.5 mg into the skin once a week. Thursday 03/20/21  Yes Vivi Barrack, MD  ELIQUIS 5 MG TABS tablet Take 1 tablet (5 mg total) by mouth 2 (two) times daily. 10/01/21  Yes Vivi Barrack, MD  levothyroxine (SYNTHROID) 150 MCG tablet Take 1 tablet (150 mcg total) by mouth daily. 04/15/21  Yes Vivi Barrack, MD  magnesium oxide (MAG-OX)  400 MG tablet Take 1 tablet (400 mg total) by mouth 2 (two) times daily. Patient taking differently: Take 800 mg by mouth at bedtime. 10/01/21  Yes Vivi Barrack, MD  metFORMIN (GLUCOPHAGE) 1000 MG tablet Take 1 tablet (1,000 mg total) by mouth 2 (two) times daily with a meal. 10/01/21  Yes Vivi Barrack, MD  metoprolol tartrate (LOPRESSOR) 50 MG tablet TAKE 1 TABLET BY MOUTH TWICE A DAY Patient taking differently: Take 50 mg by mouth 2 (two) times daily. 12/09/21  Yes Vivi Barrack, MD  Multiple Vitamin (ONE DAILY) tablet Take 1 tablet by mouth daily.    Yes [provider]  mycophenolate (MYFORTIC) 360 MG TBEC Take 360 mg by mouth 2 (two) times daily.    Yes [provider]  olmesartan (BENICAR) 5 MG tablet Take 1 tablet (5 mg total) by mouth daily. 10/01/21  Yes Vivi Barrack, MD   omeprazole (PRILOSEC) 20 MG capsule Take 1 capsule (20 mg total) by mouth daily. 10/01/21  Yes Vivi Barrack, MD  PROGRAF 0.5 MG capsule Take 0.5 mg by mouth 2 (two) times daily. 04/16/15  Yes [provider]  Blood Glucose Monitoring Suppl (ONETOUCH VERIO FLEX SYSTEM) w/Device KIT USE   TO CHECK GLUCOSE FOR BLOOD SUGAR 01/28/21   Vivi Barrack, MD  glucose blood test strip 1 each by Other route as needed for other. Use as instructed 01/23/21   Vivi Barrack, MD    Physical Exam: Vitals:   03/12/22 2158 03/13/22 0311 03/13/22 0445 03/13/22 0500  BP: (!) 165/90 133/67 127/66 125/72  Pulse: 91 86 92 88  Resp: 16 18 18 15   Temp: 98.1 F (36.7 C) 98.6 F (37 C)    TempSrc: Oral     SpO2: 96% 94% 96% 96%  Constitutional: NAD, calm, comfortable Eyes: PERRL, lids and conjunctivae normal ENMT: Mucous membranes are moist. Posterior pharynx clear of any exudate or lesions.Normal dentition.  Neck: normal, supple, no masses, no thyromegaly Respiratory: clear to auscultation bilaterally, no wheezing, no crackles. Normal respiratory effort. No accessory muscle use.  Cardiovascular: Regular rate and rhythm, no murmurs / rubs / gallops. No extremity edema. 2+ pedal pulses. No carotid bruits.  Abdomen: no tenderness, no masses palpated. No hepatosplenomegaly. Bowel sounds positive.  Musculoskeletal: no clubbing / cyanosis. No joint deformity upper and lower extremities. Good ROM, no contractures. Normal muscle tone.  Skin: See below photo of the cellulitis and Bullae.  Additionally (not shown) there is a dark callus area to posterior lateral heel from which the cellulitis seems to be originating that is suspicious for a pre-ulcerative callus. Neurologic: CN 2-12 grossly intact. Sensation intact, DTR normal. Strength 5/5 in all 4.  Psychiatric: Normal judgment and insight. Alert and oriented x 3. Normal mood.    Data Reviewed:    CBC    Component Value Date/Time   WBC 9.1 03/12/2022  1309   RBC 3.85 (L) 03/12/2022 1309   HGB 13.6 03/12/2022 1309   HGB 13.5 05/10/2021 0938   HCT 40.0 03/12/2022 1309   HCT 39.9 05/10/2021 0938   PLT 212 03/12/2022 1309   PLT 201 05/10/2021 0938   MCV 103.9 (H) 03/12/2022 1309   MCV 94 05/10/2021 0938   MCH 35.3 (H) 03/12/2022 1309   MCHC 34.0 03/12/2022 1309   RDW 12.8 03/12/2022 1309   RDW 12.8 05/10/2021 0938   LYMPHSABS 2.1 03/12/2022 1309   LYMPHSABS 1.3 05/10/2021 0938   MONOABS 0.7 03/12/2022 1309  EOSABS 0.1 03/12/2022 1309   EOSABS 0.1 05/10/2021 0938   BASOSABS 0.0 03/12/2022 1309   BASOSABS 0.0 05/10/2021 0938      Latest Ref Rng & Units 03/12/2022    1:09 PM 10/01/2021    8:45 AM 01/21/2019   12:00 AM  CMP  Glucose 70 - 99 mg/dL 217  134    BUN 8 - 23 mg/dL 10  12  18       Creatinine 0.44 - 1.00 mg/dL 1.17  1.37  1.1      Sodium 135 - 145 mmol/L 135  141  140      Potassium 3.5 - 5.1 mmol/L 5.3  5.3  5.2      Chloride 98 - 111 mmol/L 96  100  101      CO2 22 - 32 mmol/L 28  28  31       Calcium 8.9 - 10.3 mg/dL 9.5  9.7  9.6      Total Protein 6.5 - 8.1 g/dL 6.5  6.5    Total Bilirubin 0.3 - 1.2 mg/dL 0.8  0.5    Alkaline Phos 38 - 126 U/L 64  58  46      AST 15 - 41 U/L 58  69  14      ALT 0 - 44 U/L 54  60  26         This result is from an external source.   LFTs in May at [facility] (transplant hepatologist):  Bilirubin Total - [facility] 10/10/2021 0.6 0.0 - 1.2 mg/dL Final  Alkaline Phosphatase - [facility] 10/10/2021 63 44 - 121 IU/L Final  Ast - [facility] 10/10/2021 118 (H) 0 - 40 IU/L Final  Alanine Aminotransferase - [facility] 10/10/2021 79 (H) 0 - 32 IU/L Final    Assessment and Plan: * Diabetic infection of left foot (HCC) Patient with cellulitis of L foot.  Seems to be coming from a small pre-ulcerative callus on heel of foot (suspect she may have an underlying ulcer below callus).  No pain out of proportion to exam findings, no crepitus, no SIRS / sepsis.  Only concerning finding is that she has developed  a large bullae over her heel over the past 24h.  Also concerning is that she is immunosuppressed due to h/o liver transplant. Doubt NSTI - only concerning finding is that bullae; however, getting CT of foot out of an abundance of caution to make sure no other radiographic evidence to suggest otherwise. CT scan just came back: nothing on CT scan really to suggest NSTI or deeper infection either. Empiric cefepime + vanc, patient qualifies for "severe" cellulitis via our pathway as patient has: Pt is immunosuppressed Pt has Bullae formation Check MRSA PCR nares LE wound pathway Wound care consult Call either podiatry or ortho surg in AM (mostly for the pre-ulcerative callus I think).  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 Continue eliquis  Type 2 diabetes mellitus with hyperglycemia ([place]) Hold metformin Continue Trulicity Mod scale SSI AC  Hypertension associated with diabetes ([place]) Continue metoprolol Hold benicar for the moment: Mild hyperkalemia today with K 5.3 and Pt isnt sure if shes still taking this med or not at the moment.  Liver transplanted 2014 [facility]) follows with [facility] Very mild transaminitis today with normal bilirubin, LFTs actually look improved today compared to in May of this year when she last saw transplant hepatologist.  Work up at that time only showed fatty liver as far as  the liver was concerned (incedentally the CT scan that time also found small amount of pulmonary emboli for which she was started on eliquis). Continue myfortic and prograf (confirmed dosing with patient). Patient is not on prednisone and hasnt been since shortly after surgery she states Follow up with transplant hepatologist.      Advance Care Planning:   Code Status: Full Code  Consults: None  Family Communication: Husband at bedside  Severity of Illness: The appropriate patient status for this patient is OBSERVATION. Observation status is judged to be reasonable  and necessary in order to provide the required intensity of service to ensure the patient's safety. The patient's presenting symptoms, physical exam findings, and initial radiographic and laboratory data in the context of their medical condition is felt to place them at decreased risk for further clinical deterioration. Furthermore, it is anticipated that the patient will be medically stable for discharge from the hospital within 2 midnights of admission.   Author: Etta Quill., DO 03/13/2022 6:21 AM  For on call review [email or web address].

## 2022-03-13 NOTE — Assessment & Plan Note (Signed)
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 1. Continue eliquis

## 2022-03-13 NOTE — Progress Notes (Signed)
ABI w/ TBI study completed.   Please see CV Proc for preliminary results.   Cereniti Curb, RDMS, RVT  

## 2022-03-13 NOTE — Progress Notes (Signed)
Pharmacy Antibiotic Note  Traci Harper is a 66 y.o. female admitted on 03/12/2022 with cellulitis.  Pharmacy has been consulted for cefepime and vancomycin dosing. Patient is immunosuppressed currently on tacrolimus and mycophenolate. Renal function near baseline. Currently afebrile, WBC: 8.7.   Patient has received 1g of vancomycin and 2g cefepime prior to consult.   Plan: Start IV Vancomycin 1000mg  Q24H eAUC  533.  Start Cefepime 2g Q12H.   Weight: 106 kg (233 lb 11 oz)  Temp (24hrs), Avg:98.3 F (36.8 C), Min:98.1 F (36.7 C), Max:98.6 F (37 C)  Recent Labs  Lab 03/12/22 1309 03/13/22 0647  WBC 9.1 8.7  CREATININE 1.17* 1.26*    Estimated Creatinine Clearance: 52.6 mL/min (A) (by C-G formula based on SCr of 1.26 mg/dL (H)).    Allergies  Allergen Reactions   Lisinopril Cough    Thank you for allowing pharmacy to be a part of this patient's care.  Ventura Sellers 03/13/2022 10:34 AM

## 2022-03-13 NOTE — Assessment & Plan Note (Addendum)
Patient with cellulitis of L foot.  Seems to be coming from a small pre-ulcerative callus on heel of foot (suspect she may have an underlying ulcer below callus).  No pain out of proportion to exam findings, no crepitus, no SIRS / sepsis.  Only concerning finding is that she has developed a large bullae over her heel over the past 24h.  Also concerning is that she is immunosuppressed due to h/o liver transplant. 1. Doubt NSTI - only concerning finding is that bullae; however, getting CT of foot out of an abundance of caution to make sure no other radiographic evidence to suggest otherwise. 1. CT scan just came back: nothing on CT scan really to suggest NSTI or deeper infection either. 2. Empiric cefepime + vanc, patient qualifies for "severe" cellulitis via our pathway as patient has: 1. Pt is immunosuppressed 2. Pt has Bullae formation 3. Check MRSA PCR nares 4. LE wound pathway 5. Wound care consult 6. Call either podiatry or ortho surg in AM (mostly for the pre-ulcerative callus I think).

## 2022-03-13 NOTE — ED Notes (Signed)
Pt is asking for a stronger pain meds. MD messaged at this time. Will continue to monitor.

## 2022-03-13 NOTE — ED Provider Notes (Signed)
[facility] EMERGENCY DEPARTMENT Provider Note   CSN: [id number] Arrival date & time: 03/12/22  1124     History  Chief Complaint  Patient presents with   Foot Pain    Traci Harper is a 66 y.o. female.  The history is provided by the patient.  Foot Pain  She has history of hypertension, diabetes, hyperlipidemia, liver transplant, pulmonary embolism anticoagulated on apixaban and comes in because of pain and swelling in her left foot.  She states she thinks she caught it 4 days ago, and then she developed swelling in that area.  She saw her primary care provider 2 days ago who referred her to podiatry and started her on doxycycline.  She has taken 2 full days of doxycycline, but the foot is getting worse.  She denies fever, chills, sweats.   Home Medications Prior to Admission medications   Medication Sig Start Date End Date Taking? Authorizing Provider  Ascorbic Acid (VITAMIN C) 100 MG tablet Take 100 mg by mouth daily.    [provider]  ascorbic acid (VITAMIN C) 1000 MG tablet Take 1 tablet by mouth daily.    [provider]  Blood Glucose Monitoring Suppl ([place]) w/Device KIT USE   TO CHECK GLUCOSE FOR BLOOD SUGAR 01/28/21   Vivi Barrack, MD  budesonide-formoterol [facility]) 160-4.5 MCG/ACT inhaler Inhale 2 puffs into the lungs in the morning and at bedtime. 05/09/21   Lynden Oxford Scales, PA-C  calcium citrate-vitamin D (CITRACAL+D) 315-200 MG-UNIT tablet Take 1 tablet by mouth 2 (two) times daily.    [provider]  doxycycline (VIBRA-TABS) 100 MG tablet Take 1 tablet (100 mg total) by mouth 2 (two) times daily for 7 days. 03/11/22 03/18/22  Jeanie Sewer, NP  Dulaglutide (TRULICITY) 1.5 GH/8.2XH SOPN Inject 1.5 mg into the skin once a week. 03/20/21   Vivi Barrack, MD  ELIQUIS 5 MG TABS tablet Take 1 tablet (5 mg total) by mouth 2 (two) times daily. 10/01/21   Vivi Barrack, MD  glucose blood  test strip 1 each by Other route as needed for other. Use as instructed 01/23/21   Vivi Barrack, MD  levothyroxine (SYNTHROID) 150 MCG tablet Take 1 tablet (150 mcg total) by mouth daily. 04/15/21   Vivi Barrack, MD  magnesium oxide (MAG-OX) 400 MG tablet Take 1 tablet (400 mg total) by mouth 2 (two) times daily. 10/01/21   Vivi Barrack, MD  metFORMIN (GLUCOPHAGE) 1000 MG tablet Take 1 tablet (1,000 mg total) by mouth 2 (two) times daily with a meal. 10/01/21   Vivi Barrack, MD  metoprolol tartrate (LOPRESSOR) 50 MG tablet TAKE 1 TABLET BY MOUTH TWICE A DAY 12/09/21   Vivi Barrack, MD  Multiple Vitamin (ONE DAILY) tablet Take 1 tablet by mouth daily.     [provider]  mycophenolate (MYFORTIC) 360 MG TBEC Take 360 mg by mouth 2 (two) times daily.     [provider]  olmesartan (BENICAR) 5 MG tablet Take 1 tablet (5 mg total) by mouth daily. 10/01/21   Vivi Barrack, MD  omeprazole (PRILOSEC) 20 MG capsule Take 1 capsule (20 mg total) by mouth daily. 10/01/21   Vivi Barrack, MD  PROGRAF 0.5 MG capsule Take 0.5 mg by mouth. Two tablets in the morning and 1 tablet in the evening 04/16/15   [provider]  promethazine-dextromethorphan (PROMETHAZINE-DM) 6.25-15 MG/5ML syrup Take 5 mLs by mouth 4 (four)  times daily as needed for cough. 05/09/21   Lynden Oxford Scales, PA-C      Allergies    Lisinopril    Review of Systems   Review of Systems  All other systems reviewed and are negative.   Physical Exam Updated Vital Signs BP 133/67 (BP Location: Left Arm)   Pulse 86   Temp 98.6 F (37 C)   Resp 18   SpO2 94%  Physical Exam Vitals and nursing note reviewed.   66 year old female, resting comfortably and in no acute distress. Vital signs are normal. Oxygen saturation is 94%, which is normal. Head is normocephalic and atraumatic. PERRLA, EOMI. Oropharynx is clear. Neck is nontender and supple without adenopathy or JVD. Back is nontender and there is  no CVA tenderness. Lungs are clear without rales, wheezes, or rhonchi. Chest is nontender. Heart has regular rate and rhythm without murmur. Abdomen is soft, flat, nontender. Extremities: Bulla is present on the posterior-lateral aspect of the left heel.  There is moderate erythema and soft tissue swelling diffusely throughout the lateral aspect of the left ankle extending into the left lower leg.  No lymphangitic streaks are seen. Skin is warm and dry without rash. Neurologic: Mental status is normal, cranial nerves are intact, moves all extremities equally.   ED Results / Procedures / Treatments   Labs (all labs ordered are listed, but only abnormal results are displayed) Labs Reviewed  CBC WITH DIFFERENTIAL/PLATELET - Abnormal; Notable for the following components:      Result Value   RBC 3.85 (*)    MCV 103.9 (*)    MCH 35.3 (*)    All other components within normal limits  COMPREHENSIVE METABOLIC PANEL - Abnormal; Notable for the following components:   Potassium 5.3 (*)    Chloride 96 (*)    Glucose, Bld 217 (*)    Creatinine, Ser 1.17 (*)    AST 58 (*)    ALT 54 (*)    GFR, Estimated 52 (*)    All other components within normal limits   Radiology CT EXTREMITY LOWER LEFT W CONTRAST  Result Date: 03/13/2022 CLINICAL DATA:  66 year old diabetic female with left foot cellulitis. Increased redness pain and swelling. Rule out necrotizing fasciitis. EXAM: CT OF THE LOWER LEFT EXTREMITY WITH CONTRAST TECHNIQUE: Multidetector CT imaging of the lower left extremity was performed according to the standard protocol following intravenous contrast administration. RADIATION DOSE REDUCTION: This exam was performed according to the departmental dose-optimization program which includes automated exposure control, adjustment of the mA and/or kV according to patient size and/or use of iterative reconstruction technique. CONTRAST:  86m OMNIPAQUE IOHEXOL 350 MG/ML SOLN COMPARISON:  Left foot  radiographs 03/12/2022. FINDINGS: Calcified peripheral vascular disease. Major vascular structures in the visible left lower extremity appear to be enhancing and patent. Above the left knee soft tissues appear negative except for muscle atrophy. Beginning at the left knee joint there is posterolateral subcutaneous swelling and stranding which become circumferential in the tib fib and continues distally. Along the left lateral calcaneus a simple fluid density superficial soft tissue swelling of up to 3.5 x 1.5 cm (series 4, image 290) is demonstrated, and this abnormal dermal appearing edema tracks laterally and posteriorly along the posterior foot as seen on series 9, image 93. Underlying subcutaneous stranding in the visible foot. But no soft tissue gas in the lower extremity. And no other fluid collection. Distal femur is intact. Patella is intact. No knee joint effusion. Tibial plateau and  proximal fibula are intact. Tibia and fibula shafts appear intact. Distal tibia and fibula appear intact. Normal mortise joint alignment. Talus is intact. No ankle joint effusion. Calcaneus appears intact. No fracture or cortical osteolysis identified. IMPRESSION: 1. Posterior left foot Dermal fluid collection tracking around the posterolateral calcaneus (series 4, image 290). Underlying subcutaneous and generalized tib-fib Cellulitis. But no soft tissue gas or CT evidence of osteomyelitis. And no subcutaneous or deep fluid collection. 2. Calcified peripheral vascular disease. Electronically Signed   By: Genevie Ann M.D.   On: 03/13/2022 05:59   DG Foot Complete Left  Result Date: 03/12/2022 CLINICAL DATA:  Left foot cellulitis.  Diabetic. EXAM: LEFT FOOT - COMPLETE 3+ VIEW COMPARISON:  None Available. FINDINGS: Moderate diffuse soft tissue edema. No signs of acute fracture or dislocation. Plantar and posterior calcaneal heel spurs noted. No bone erosions noted. IMPRESSION: 1. Moderate diffuse soft tissue edema. 2. No acute  bone abnormality. Electronically Signed   By: Kerby Moors M.D.   On: 03/12/2022 13:25    Procedures Procedures    Medications Ordered in ED Medications  ceFAZolin (ANCEF) IVPB 2g/100 mL premix (has no administration in time range)    ED Course/ Medical Decision Making/ A&P                           Medical Decision Making Amount and/or Complexity of Data Reviewed Radiology: ordered.  Risk Prescription drug management. Decision regarding hospitalization.   Bulla of the left heel with cellulitis of the left ankle.  This has failed outpatient management in a patient to is immunosuppressed because of transplant status.  X-rays of the left foot show diffuse soft tissue edema.  I have independently viewed the images, and agree with radiologist interpretation.  I have reviewed and interpreted her laboratory tests, and my interpretation is mild elevation of transaminases in a range that is similar to what she has been at in the past, macrocytosis without anemia.  I have ordered intravenous cefazolin, she will need to be admitted for IV antibiotics.  She will also need consultation with podiatry and may need the bulla debrided.  Case is discussed with Dr. Alcario Drought of [facility], who agrees to admit the patient.  However, he is concerned about patient's immune status and status as a diabetic and wishes more aggressive antibiotics be ordered.  I have changed the order vancomycin and cefepime.  I have also ordered CT scan of the lower leg to look for evidence of necrotizing fasciitis.  CT scan shows evidence of cellulitis and a subcutaneous fluid collection consistent with my exam, but no evidence of necrotizing fasciitis.  I have independently viewed the images, and agree with the radiologist's interpretation.  CRITICAL CARE Performed by: Delora Fuel Total critical care time: 40 minutes Critical care time was exclusive of separately billable procedures and treating other  patients. Critical care was necessary to treat or prevent imminent or life-threatening deterioration. Critical care was time spent personally by me on the following activities: development of treatment plan with patient and/or surrogate as well as nursing, discussions with consultants, evaluation of patient's response to treatment, examination of patient, obtaining history from patient or surrogate, ordering and performing treatments and interventions, ordering and review of laboratory studies, ordering and review of radiographic studies, pulse oximetry and re-evaluation of patient's condition.  Final Clinical Impression(s) / ED Diagnoses Final diagnoses:  Cellulitis of left ankle  Elevated transaminase level    Rx / DC  Orders ED Discharge Orders     None         Delora Fuel, MD 94/80/16 [phone]

## 2022-03-13 NOTE — Inpatient Diabetes Management (Signed)
Inpatient Diabetes Program Recommendations  AACE/ADA: New Consensus Statement on Inpatient Glycemic Control (2015)  Target Ranges:  Prepandial:   less than 140 mg/dL      Peak postprandial:   less than 180 mg/dL (1-2 hours)      Critically ill patients:  140 - 180 mg/dL   Lab Results  Component Value Date   GLUCAP 241 (H) 03/13/2022   HGBA1C 8.0 (H) 03/13/2022    Review of Glycemic Control  Latest Reference Range & Units 03/13/22 08:33 03/13/22 11:34  Glucose-Capillary 70 - 99 mg/dL 209 (H) 241 (H)   Diabetes history: DM 2 Outpatient Diabetes medications: Trulicity 1.5 mg Weekly on Thursdays, Metformin 1000 mg bid Current orders for Inpatient glycemic control:  Trulicity 1.5 mg weekly Novolog 0-15 units tid  A1c 8% on 10/12  Spoke with pt at bedside regarding A1c and glucose control at home. Pt reports being followed by Dr. Dimas Chyle, MD for DM management. Pt reports at last visit her PCP considered switching from Trulicity to [place]. Discussed current A1c level of 8%. Pt reports trying to take medication regularly but also explains that she does not check her glucose regularly, but has the supplies to do it. Pt reports her next follow up appt is November 2nd. Pt does not comply with DM diet at home. Pts activity is limited due to foot infection. Discussed chair exercises. Discussed diet modifications. Stressed importance of glucose control during this time.   -  Consider Semglee 15 units  Thanks,  Tama Headings RN, MSN, BC-ADM Inpatient Diabetes Coordinator Team Pager [phone] (8a-5p)

## 2022-03-13 NOTE — Assessment & Plan Note (Signed)
1. Continue metoprolol 2. Hold benicar for the moment: 1. Mild hyperkalemia today with K 5.3 and 2. Pt isnt sure if shes still taking this med or not at the moment.

## 2022-03-13 NOTE — Consult Note (Addendum)
[facility] NOTE   MRN : [id number]  Reason for Consult: left heel wound Referring Physician: Sheran Luz MD  History of Present Illness: 66 y/o female with history of DM, HTN, PE's manged with Eliquis and Liver transplant.  She is immunosuppressed.  She was een by her PCP and given oral antibiotics for the cellulitis a few days ago.  They referred her to [facility].  Once they said she would not be seen until next week she present to [facility] with a chief complaint of left foot pain, increased erythema and swelling.  She has developed a large blister on the posterior heel with surrounding erythema.  ABI's have been ordered.    She denies history of non healing wounds, claudication, or rest pain.  She does have peripheral neuropathy and dry cracking skin on B heels in the past.  She also has history of chronic back pain without radicular pain.        Current Facility-Administered Medications  Medication Dose Route Frequency Provider Last Rate Last Admin   acetaminophen (TYLENOL) tablet 650 mg  650 mg Oral Q6H PRN Etta Quill, DO       Or   acetaminophen (TYLENOL) suppository 650 mg  650 mg Rectal Q6H PRN Etta Quill, DO       apixaban Arne Cleveland) tablet 5 mg  5 mg Oral BID Etta Quill, DO       Dulaglutide SOPN 1.5 mg  1.5 mg Subcutaneous Weekly Alcario Drought, Jared M, DO       insulin aspart (novoLOG) injection 0-15 Units  0-15 Units Subcutaneous TID WC Etta Quill, DO       levothyroxine (SYNTHROID) tablet 150 mcg  150 mcg Oral Q0600 Etta Quill, DO   150 mcg at 03/13/22 6770   metoprolol tartrate (LOPRESSOR) tablet 50 mg  50 mg Oral BID Jennette Kettle M, DO       mycophenolate (MYFORTIC) EC tablet 360 mg  360 mg Oral BID Jennette Kettle M, DO       ondansetron [facility]) tablet 4 mg  4 mg Oral Q6H PRN Etta Quill, DO       Or   ondansetron [facility]) injection 4 mg  4 mg Intravenous Q6H PRN Etta Quill, DO        tacrolimus (PROGRAF) capsule 0.5 mg  0.5 mg Oral BID Etta Quill, DO       Current Outpatient Medications  Medication Sig Dispense Refill   Ascorbic Acid (VITAMIN C) 100 MG tablet Take 100 mg by mouth daily.     calcium citrate-vitamin D (CITRACAL+D) 315-200 MG-UNIT tablet Take 1 tablet by mouth daily.     doxycycline (VIBRA-TABS) 100 MG tablet Take 1 tablet (100 mg total) by mouth 2 (two) times daily for 7 days. 14 tablet 0   Dulaglutide (TRULICITY) 1.5 HE/0.3TC SOPN Inject 1.5 mg into the skin once a week. (Patient taking differently: Inject 1.5 mg into the skin once a week. Thursday) 6 mL 3   ELIQUIS 5 MG TABS tablet Take 1 tablet (5 mg total) by mouth 2 (two) times daily. 60 tablet 5   levothyroxine (SYNTHROID) 150 MCG tablet Take 1 tablet (150 mcg total) by mouth daily. 90 tablet 3   magnesium oxide (MAG-OX) 400 MG tablet Take 1 tablet (400 mg total) by mouth 2 (two) times daily. (Patient taking differently: Take 800 mg by mouth at bedtime.) 180 tablet 3  metFORMIN (GLUCOPHAGE) 1000 MG tablet Take 1 tablet (1,000 mg total) by mouth 2 (two) times daily with a meal. 180 tablet 3   metoprolol tartrate (LOPRESSOR) 50 MG tablet TAKE 1 TABLET BY MOUTH TWICE A DAY (Patient taking differently: Take 50 mg by mouth 2 (two) times daily.) 180 tablet 1   Multiple Vitamin (ONE DAILY) tablet Take 1 tablet by mouth daily.      mycophenolate (MYFORTIC) 360 MG TBEC Take 360 mg by mouth 2 (two) times daily.      olmesartan (BENICAR) 5 MG tablet Take 1 tablet (5 mg total) by mouth daily. 90 tablet 1   omeprazole (PRILOSEC) 20 MG capsule Take 1 capsule (20 mg total) by mouth daily. 90 capsule 3   PROGRAF 0.5 MG capsule Take 0.5 mg by mouth 2 (two) times daily.  10   Blood Glucose Monitoring Suppl (ONETOUCH VERIO FLEX SYSTEM) w/Device KIT USE   TO CHECK GLUCOSE FOR BLOOD SUGAR 1 kit 3   glucose blood test strip 1 each by Other route as needed for other. Use as instructed 100 each 1    Pt meds  include: Statin :No Betablocker: Yes ASA: No Other anticoagulants/antiplatelets: Eliquis with history of PE's  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 58   Thyroid disease     Past Surgical History:  Procedure Laterality Date   APPENDECTOMY     BACK SURGERY     BREAST BIOPSY Right    CESAREAN SECTION     x2   CHOLECYSTECTOMY     DILATION AND CURETTAGE OF UTERUS     at [place]     LIVER TRANSPLANT  09/17/2012   @[facility]   THYROID SURGERY     x2   TUBAL LIGATION     V-TACH ABLATION      Social History Social History   Tobacco Use   Smoking status: Former    Types: Cigarettes    Quit date: 06/02/2002    Years since quitting: 19.7  Substance Use Topics   Alcohol use: No    Comment: No alcohol since 2012   Drug use: No    Family History Family History  Problem Relation Age of Onset   Diabetes Mother    Heart disease Father    Diabetes Father     Allergies  Allergen Reactions   Lisinopril Cough     REVIEW OF SYSTEMS  General: [ ]  Weight loss, [ ]  Fever, [ ]  chills Neurologic: [ ]  Dizziness, [ ]  Blackouts, [ ]  Seizure [ ]  Stroke, [ ]  "Mini stroke", [ ]  Slurred speech, [ ]  Temporary blindness; [ ]  weakness in arms or legs, [ ]  Hoarseness [ ]  Dysphagia Cardiac: [ ]  Chest pain/pressure, [ ]  Shortness of breath at rest [ ]  Shortness of breath with exertion, [ ]  Atrial fibrillation or irregular heartbeat  Vascular: [ ]  Pain in legs with walking, [ ]  Pain in legs at rest, [ ]  Pain in legs at night,  [ ]  Non-healing ulcer, [ ]  Blood clot in vein/DVT,   Pulmonary: [ ]  Home oxygen, [ ]  Productive cough, [ ]  Coughing up blood, [ ]  Asthma,  [ ]  Wheezing [ ]  COPD Musculoskeletal:  [ ]  Arthritis, [ ]  Low  back pain, [ ]  Joint pain Hematologic: [ ]  Easy Bruising, [ ]  Anemia; [ ]   Hepatitis Gastrointestinal: [ ]  Blood in stool, [ ]  Gastroesophageal Reflux/heartburn, Urinary: [ ]  chronic Kidney disease, [ ]  on HD - [ ]  MWF or [ ]  TTHS, [ ]  Burning with urination, [ ]  Difficulty urinating Skin: [ ]  Rashes, [ ] x Wounds Psychological: [ ]  Anxiety, [ ]  Depression  Physical Examination Vitals:   03/13/22 0311 03/13/22 0445 03/13/22 0500 03/13/22 0651  BP: 133/67 127/66 125/72 118/76  Pulse: 86 92 88 92  Resp: 18 18 15 16   Temp: 98.6 F (37 C)     TempSrc:      SpO2: 94% 96% 96% 96%   There is no height or weight on file to calculate BMI.  General:  WDWN in NAD Gait: keeping heel off the ground due to pain HENT: WNL Eyes: Pupils equal Pulmonary: normal non-labored breathing , without Rales, rhonchi,  wheezing Cardiac: RRR, without  Murmurs, rubs or gallops; No carotid bruits Abdomen: soft, NT, no masses Skin: no rashes, ulcers noted;  no Gangrene , no cellulitis; no open wounds;    Vascular Exam/Pulses:palpable left DP, doppler B DP/PT/Peroneal    Musculoskeletal: no muscle wasting or atrophy; no edema  Neurologic: A&O X 3; Appropriate Affect ;  SENSATION: normal; MOTOR FUNCTION: 5/5 Symmetric Speech is fluent/normal   Significant Diagnostic Studies: CBC Lab Results  Component Value Date   WBC 8.7 03/13/2022   HGB 12.9 03/13/2022   HCT 39.2 03/13/2022   MCV 107.1 (H) 03/13/2022   PLT 193 03/13/2022    BMET    Component Value Date/Time   NA 131 (L) 03/13/2022 0647   NA 140 01/21/2019 0000   K 4.4 03/13/2022 0647   CL 96 (L) 03/13/2022 0647   CO2 20 (L) 03/13/2022 0647   GLUCOSE 250 (H) 03/13/2022 0647   BUN 15 03/13/2022 0647   BUN 18 01/21/2019 0000   CREATININE 1.26 (H) 03/13/2022 0647   CALCIUM 8.6 (L) 03/13/2022 0647   GFRNONAA 48 (L) 03/13/2022 0647   GFRAA 69 02/10/2018 0000   GFRAA 69 02/10/2018 0000   CrCl cannot be calculated (Unknown ideal weight.).  COAG Lab Results  Component Value Date   INR 1.49 04/24/2011      Non-Invasive Vascular Imaging:   ABI's have been ordered  EXAM: LEFT FOOT - COMPLETE 3+ VIEW   COMPARISON:  None Available.   FINDINGS: Moderate diffuse soft tissue edema. No signs of acute fracture or dislocation. Plantar and posterior calcaneal heel spurs noted. No bone erosions noted.   IMPRESSION: 1. Moderate diffuse soft tissue edema. 2. No acute bone abnormality.  ASSESSMENT/PLAN:   DM  and s/p liver transplant with immuno compromise with large left heel blister and left LE cellulitis.   She has a palpable left DP with intact Doppler PT/Peroneal signals biphasic.  She denies previous history of claudication, rest pain or non healing wounds.  Pending results of ABI's she may benefit from angiogram to insure good inflow to the foot.   She has a referral to [facility] please inform them the patient is here and should bee seen by them for foot DM care.    She is afebrile and WBC 8.7 she received 1 dose so far of Cefepime     Roxy Horseman 03/13/2022 8:30 AM  VASCULAR STAFF ADDENDUM: I have independently interviewed and examined the patient. I agree with the above. Wound present for one week.  ABI reveiwed - Pt with sufficient blood flow for wound healing. No indication for angiogram.  Cassandria Santee, MD [facility] Phone Number: [phone] 03/13/2022 3:50 PM    ABI Findings:  +---------+------------------+-----+---------+--------+  Right   Rt Pressure (mmHg)IndexWaveform Comment   +---------+------------------+-----+---------+--------+  Brachial 130                    triphasic          +---------+------------------+-----+---------+--------+  PTA     134               1.02 triphasic          +---------+------------------+-----+---------+--------+  DP      127               0.97 triphasic          +---------+------------------+-----+---------+--------+  Great Toe110                0.84 Normal             +---------+------------------+-----+---------+--------+   +---------+------------------+-----+-----------+-------+  Left    Lt Pressure (mmHg)IndexWaveform   Comment  +---------+------------------+-----+-----------+-------+  Brachial 131                    triphasic           +---------+------------------+-----+-----------+-------+  PTA     126               0.96 triphasic           +---------+------------------+-----+-----------+-------+  DP      120               0.92 multiphasic         +---------+------------------+-----+-----------+-------+  Great Toe87                0.66 Abnormal            +---------+------------------+-----+-----------+-------+   +-------+-----------+-----------+------------+------------+  ABI/TBIToday's ABIToday's TBIPrevious ABIPrevious TBI  +-------+-----------+-----------+------------+------------+  Right 1.02       0.84                                 +-------+-----------+-----------+------------+------------+  Left  0.96       0.66

## 2022-03-13 NOTE — Consult Note (Signed)
WOC Nurse Consult Note: Reason for Consult:Left foot wounds (heel with blood filled blister (DTPI) and pre ulcerative callus Wound type:Neuropathic Pressure Injury POA: Yes Measurement:Bedside RN to measure and document measurements on Nursing Flow Sheet today with application of next dressing Wound bed: See photograph provided by EDP yesterday Drainage (amount, consistency, odor) None Periwound: callus, erythema Dressing procedure/placement/frequency: I will provide Nursing with conservative guidance for topical care of the left foot wounds using a NS cleanse, pat dry and covering the lesions with folded layers of antimicrobial nonadherent (xeroform) gauze. This will be topped with dry gauze and secured with a few turns of Kerlix roll gauze/.paper tape. The foot is then to be placed into a Prevalon pressure redistribution heel boot. A sacral prophylactic silicone foam dressing is to be placed for PI prevention.  I have communicated with Dr. Doristine Bosworth this morning about my agreement with Dr Juleen China intent to consult with either Podiatric Medicine or Orthopedics. I have indicated that I have a fair amount of certainty that the blood-filled blister will evolve into a full thickness wound requiring follow up by a Provider.  [place] nursing team will not follow, but will remain available to this patient, the nursing and medical teams.  Please re-consult if needed.  Thank you for inviting [place] to participate in this patient's Plan of Care.  Maudie Flakes, MSN, RN, CNS, [place], Serita Grammes, [employer], [employer] phone:  [phone]

## 2022-03-14 ENCOUNTER — Encounter (HOSPITAL_COMMUNITY): Payer: Self-pay | Admitting: Internal Medicine

## 2022-03-14 DIAGNOSIS — L02612 Cutaneous abscess of left foot: Secondary | ICD-10-CM | POA: Diagnosis present

## 2022-03-14 DIAGNOSIS — L03116 Cellulitis of left lower limb: Secondary | ICD-10-CM | POA: Diagnosis present

## 2022-03-14 DIAGNOSIS — L089 Local infection of the skin and subcutaneous tissue, unspecified: Secondary | ICD-10-CM | POA: Diagnosis not present

## 2022-03-14 DIAGNOSIS — Z7951 Long term (current) use of inhaled steroids: Secondary | ICD-10-CM | POA: Diagnosis not present

## 2022-03-14 DIAGNOSIS — E1169 Type 2 diabetes mellitus with other specified complication: Secondary | ICD-10-CM | POA: Diagnosis present

## 2022-03-14 DIAGNOSIS — E871 Hypo-osmolality and hyponatremia: Secondary | ICD-10-CM | POA: Diagnosis present

## 2022-03-14 DIAGNOSIS — R7401 Elevation of levels of liver transaminase levels: Secondary | ICD-10-CM | POA: Diagnosis present

## 2022-03-14 DIAGNOSIS — E039 Hypothyroidism, unspecified: Secondary | ICD-10-CM | POA: Diagnosis present

## 2022-03-14 DIAGNOSIS — Z944 Liver transplant status: Secondary | ICD-10-CM | POA: Diagnosis not present

## 2022-03-14 DIAGNOSIS — Z7969 Long term (current) use of other immunomodulators and immunosuppressants: Secondary | ICD-10-CM | POA: Diagnosis not present

## 2022-03-14 DIAGNOSIS — Z7901 Long term (current) use of anticoagulants: Secondary | ICD-10-CM | POA: Diagnosis not present

## 2022-03-14 DIAGNOSIS — Z86711 Personal history of pulmonary embolism: Secondary | ICD-10-CM | POA: Diagnosis not present

## 2022-03-14 DIAGNOSIS — E1122 Type 2 diabetes mellitus with diabetic chronic kidney disease: Secondary | ICD-10-CM | POA: Diagnosis present

## 2022-03-14 DIAGNOSIS — E1165 Type 2 diabetes mellitus with hyperglycemia: Secondary | ICD-10-CM | POA: Diagnosis present

## 2022-03-14 DIAGNOSIS — N1831 Chronic kidney disease, stage 3a: Secondary | ICD-10-CM | POA: Diagnosis present

## 2022-03-14 DIAGNOSIS — Z6841 Body Mass Index (BMI) 40.0 and over, adult: Secondary | ICD-10-CM | POA: Diagnosis not present

## 2022-03-14 DIAGNOSIS — E785 Hyperlipidemia, unspecified: Secondary | ICD-10-CM | POA: Diagnosis present

## 2022-03-14 DIAGNOSIS — Z79899 Other long term (current) drug therapy: Secondary | ICD-10-CM | POA: Diagnosis not present

## 2022-03-14 DIAGNOSIS — Z79890 Hormone replacement therapy (postmenopausal): Secondary | ICD-10-CM | POA: Diagnosis not present

## 2022-03-14 DIAGNOSIS — E11628 Type 2 diabetes mellitus with other skin complications: Secondary | ICD-10-CM | POA: Diagnosis present

## 2022-03-14 DIAGNOSIS — I152 Hypertension secondary to endocrine disorders: Secondary | ICD-10-CM | POA: Diagnosis present

## 2022-03-14 DIAGNOSIS — Z87891 Personal history of nicotine dependence: Secondary | ICD-10-CM | POA: Diagnosis not present

## 2022-03-14 DIAGNOSIS — G8929 Other chronic pain: Secondary | ICD-10-CM | POA: Diagnosis present

## 2022-03-14 DIAGNOSIS — E875 Hyperkalemia: Secondary | ICD-10-CM | POA: Diagnosis present

## 2022-03-14 DIAGNOSIS — L89626 Pressure-induced deep tissue damage of left heel: Secondary | ICD-10-CM | POA: Diagnosis present

## 2022-03-14 DIAGNOSIS — M549 Dorsalgia, unspecified: Secondary | ICD-10-CM | POA: Diagnosis present

## 2022-03-14 LAB — COMPREHENSIVE METABOLIC PANEL
ALT: 43 U/L (ref 0–44)
AST: 62 U/L — ABNORMAL HIGH (ref 15–41)
Albumin: 3 g/dL — ABNORMAL LOW (ref 3.5–5.0)
Alkaline Phosphatase: 61 U/L (ref 38–126)
Anion gap: 9 (ref 5–15)
BUN: 14 mg/dL (ref 8–23)
CO2: 26 mmol/L (ref 22–32)
Calcium: 9.2 mg/dL (ref 8.9–10.3)
Chloride: 101 mmol/L (ref 98–111)
Creatinine, Ser: 1.2 mg/dL — ABNORMAL HIGH (ref 0.44–1.00)
GFR, Estimated: 51 mL/min — ABNORMAL LOW (ref >60)
Glucose, Bld: 191 mg/dL — ABNORMAL HIGH (ref 70–99)
Potassium: 4.4 mmol/L (ref 3.5–5.1)
Sodium: 136 mmol/L (ref 135–145)
Total Bilirubin: 0.7 mg/dL (ref 0.3–1.2)
Total Protein: 6.1 g/dL — ABNORMAL LOW (ref 6.5–8.1)

## 2022-03-14 LAB — CBC WITH DIFFERENTIAL/PLATELET
Abs Immature Granulocytes: 0.02 10*3/uL (ref 0.00–0.07)
Basophils Absolute: 0 10*3/uL (ref 0.0–0.1)
Basophils Relative: 0 %
Eosinophils Absolute: 0.3 10*3/uL (ref 0.0–0.5)
Eosinophils Relative: 5 %
HCT: 39.6 % (ref 36.0–46.0)
Hemoglobin: 13.6 g/dL (ref 12.0–15.0)
Immature Granulocytes: 0 %
Lymphocytes Relative: 33 %
Lymphs Abs: 2.5 10*3/uL (ref 0.7–4.0)
MCH: 35.8 pg — ABNORMAL HIGH (ref 26.0–34.0)
MCHC: 34.3 g/dL (ref 30.0–36.0)
MCV: 104.2 fL — ABNORMAL HIGH (ref 80.0–100.0)
Monocytes Absolute: 0.5 10*3/uL (ref 0.1–1.0)
Monocytes Relative: 7 %
Neutro Abs: 4.2 10*3/uL (ref 1.7–7.7)
Neutrophils Relative %: 55 %
Platelets: 223 10*3/uL (ref 150–400)
RBC: 3.8 MIL/uL — ABNORMAL LOW (ref 3.87–5.11)
RDW: 12.8 % (ref 11.5–15.5)
WBC: 7.6 10*3/uL (ref 4.0–10.5)
nRBC: 0 % (ref 0.0–0.2)

## 2022-03-14 LAB — GLUCOSE, CAPILLARY
Glucose-Capillary: 237 mg/dL — ABNORMAL HIGH (ref 70–99)
Glucose-Capillary: 208 mg/dL — ABNORMAL HIGH (ref 70–99)
Glucose-Capillary: 159 mg/dL — ABNORMAL HIGH (ref 70–99)
Glucose-Capillary: 230 mg/dL — ABNORMAL HIGH (ref 70–99)

## 2022-03-14 LAB — HIV ANTIBODY (ROUTINE TESTING W REFLEX): HIV Screen 4th Generation wRfx: NONREACTIVE

## 2022-03-14 MED ORDER — INSULIN GLARGINE-YFGN 100 UNIT/ML ~~LOC~~ SOLN
10.0000 [IU] | Freq: Every day | SUBCUTANEOUS | Status: DC
  Administered 2022-03-14: 10 [IU] via SUBCUTANEOUS
  Filled 2022-03-14 (×2): qty 0.1

## 2022-03-14 MED ORDER — JUVEN PO PACK
1.0000 | PACK | Freq: Two times a day (BID) | ORAL | Status: DC
  Administered 2022-03-14 – 2022-03-16 (×4): 1 via ORAL
  Filled 2022-03-14 (×4): qty 1

## 2022-03-14 MED ORDER — PROSOURCE PLUS PO LIQD
30.0000 mL | Freq: Three times a day (TID) | ORAL | Status: DC
  Administered 2022-03-14 – 2022-03-16 (×5): 30 mL via ORAL
  Filled 2022-03-14 (×4): qty 30

## 2022-03-14 NOTE — TOC Initial Note (Signed)
Transition of Care [facility]) - Initial/Assessment Note    Patient Details  Name: Traci Harper MRN: [id number] Date of Birth: 12-24-1955  Transition of [facility] CM/SW Contact:    Ninfa Meeker, RN Phone Number: 03/14/2022, 9:56 AM  Clinical Narrative:                 Transition of Care Screening Note: Adm with left foot diabetic infection  Transition of Care Department (TOC) has reviewed patient and no TOC needs have been identified at this time. We will continue to monitor patient advancement through Interdisciplinary progressions. If new patient transition needs arise, please place a consult.       Patient Goals and CMS Choice        Expected Discharge Plan and Services                                                Prior Living Arrangements/Services                       Activities of Daily Living Home Assistive Devices/Equipment: CBG Meter, Cane (specify quad or straight), Bedside commode/3-in-1 ADL Screening (condition at time of admission) Patient's cognitive ability adequate to safely complete daily activities?: Yes Is the patient deaf or have difficulty hearing?: No Does the patient have difficulty seeing, even when wearing glasses/contacts?: No Does the patient have difficulty concentrating, remembering, or making decisions?: No Patient able to express need for assistance with ADLs?: Yes Does the patient have difficulty dressing or bathing?: No Independently performs ADLs?: Yes (appropriate for developmental age) Does the patient have difficulty walking or climbing stairs?: No Weakness of Legs: None Weakness of Arms/Hands: None  Permission Sought/Granted                  Emotional Assessment              Admission diagnosis:  Elevated transaminase level [R74.01] Diabetic infection of left foot ([place]) [Y48.250, L08.9] Cellulitis of left ankle [L03.116] Patient Active Problem List   Diagnosis Date Noted   Diabetic  infection of left foot ([place]) 03/13/2022   Stress 10/01/2021   Dyslipidemia associated with type 2 diabetes mellitus ([place]) 10/01/2021   Pulmonary embolism ([place]) 04/02/2021   Hypertension associated with diabetes ([place]) 04/23/2020   Type 2 diabetes mellitus with hyperglycemia ([place]) 04/23/2020   Hypothyroidism 04/23/2020   GERD (gastroesophageal reflux disease) 04/23/2020   Liver transplanted 2014 [facility]) follows with [facility] 04/01/2013   PCP:  Vivi Barrack, MD Pharmacy:   [place], [place] [address] Phone: [phone] Fax: [id number] - [place], [place] - [address] AT [place] [address] [place] [address] Phone: [phone] Fax: [place], [address] [place] [address] Phone: [phone] Fax: [phone]     Social Determinants of Health (SDOH) Interventions    Readmission Risk Interventions     No data to display

## 2022-03-14 NOTE — Progress Notes (Signed)
PROGRESS NOTE    Traci Harper  ZOX:[id number] DOB: [birth year] DOA: 03/12/2022 PCP: Ardith Dark, MD   Brief Narrative:  HPI: Traci Harper is a 66 y.o. female with medical history significant of liver transplant 2014, DM2, HTN, PEs on Eliquis.   Pt presents to the ED with c/o pain and swelling in L foot. Onset ~4 days ago coming from a callus area in heel of L foot.  Went to PCP on 10/10 who started her on doxycycline and referred her to podiatry.   Despite 2 days of doxycycline cellulitis seems to have worsened, now with erythema going up to mid calf area on front of leg and over past 24h development of a large bullae to back of ankle just above her heel (photo in physical exam below).   She says she had significant pain going up to knee though this has improved over past few hours.   She has no fevers, chills sweats.    Assessment & Plan:   Principal Problem:   Diabetic infection of left foot (HCC) Active Problems:   Liver transplanted 2014 [facility]) follows with [facility]   Hypertension associated with diabetes (HCC)   Type 2 diabetes mellitus with hyperglycemia (HCC)   Pulmonary embolism (HCC)   Foot infection  Diabetic foot infection left heel/possible abscess seen/bulla/cellulitis of the left foot and lower part of the left lower extremity: Podiatry and vascular consulted.  Underwent bedside incision and drainage on 03/13/2022.  Has dressing in place.  Podiatry to reevaluate and redo the dressing today.  We will continue antibiotics in the meantime.  Cellulitis improving.  Seen by vascular surgery, ABI completed, per vascular, she has sufficient blood flow for wound healing, no indication of angiogram.  History of PE and DVTs: Continue Eliquis.  Type 2 diabetes mellitus: Hold metformin.  Continue Trulicity and SSI.  Essential hypertension: Controlled.  Continue metoprolol and Benicar.  Acute hyponatremia: Resolved  CKD stage IIIa: At baseline.  Hyperkalemia:  Resolved.  Acquired hypothyroidism: Continue Synthroid.  Liver transplanted 2014 [facility]) follows with [facility]: Very mild transaminitis with normal bilirubin, LFTs actually look improved  compared to in May of this year when she last saw transplant hepatologist.  Work up at that time only showed fatty liver as far as the liver was concerned (incedentally the CT scan that time also found small amount of pulmonary emboli for which she was started on eliquis). Continue myfortic and prograf (confirmed dosing with patient). Patient is not on prednisone and hasnt been since shortly after surgery she states Follow up with transplant hepatologist.  DVT prophylaxis:   Eliquis   Code Status: Full Code  Family Communication:  None present at bedside.  Plan of care discussed with patient in length and he/she verbalized understanding and agreed with it.  Status is: Inpatient Remains inpatient appropriate because: Needs more IV antibiotics.  Potential discharge tomorrow.    Estimated body mass index is 43.29 kg/m as calculated from the following:   Height as of this encounter: 5\' 3"  (1.6 m).   Weight as of this encounter: 110.9 kg.    Nutritional Assessment: Body mass index is 43.29 kg/m. Seen by dietician.  I agree with the assessment and plan as outlined below: Nutrition Status:        . Skin Assessment: I have examined the patient's skin and I agree with the wound assessment as performed by the wound care RN as outlined below:    Consultants:  Vascular surgery Podiatry  Procedures:  As above  Antimicrobials:  Anti-infectives (From admission, onward)    Start     Dose/Rate Route Frequency Ordered Stop   03/14/22 0500  vancomycin (VANCOCIN) IVPB 1000 mg/200 mL premix        1,000 mg 200 mL/hr over 60 Minutes Intravenous Every 24 hours 03/13/22 1033     03/13/22 1800  ceFEPIme (MAXIPIME) 2 g in sodium chloride 0.9 % 100 mL IVPB        2 g 200 mL/hr over 30 Minutes Intravenous  Every 12 hours 03/13/22 1033     03/13/22 1030  vancomycin (VANCOCIN) IVPB 1000 mg/200 mL premix  Status:  Discontinued        1,000 mg 200 mL/hr over 60 Minutes Intravenous  Once 03/13/22 1023 03/13/22 1024   03/13/22 1030  ceFEPIme (MAXIPIME) 2 g in sodium chloride 0.9 % 100 mL IVPB  Status:  Discontinued        2 g 200 mL/hr over 30 Minutes Intravenous  Once 03/13/22 1023 03/13/22 1024   03/13/22 0600  ceFAZolin (ANCEF) IVPB 2g/100 mL premix  Status:  Discontinued        2 g 200 mL/hr over 30 Minutes Intravenous Every 8 hours 03/13/22 0415 03/13/22 0457   03/13/22 0500  vancomycin (VANCOCIN) IVPB 1000 mg/200 mL premix        1,000 mg 200 mL/hr over 60 Minutes Intravenous  Once 03/13/22 0457 03/13/22 0634   03/13/22 0500  ceFEPIme (MAXIPIME) 2 g in sodium chloride 0.9 % 100 mL IVPB        2 g 200 mL/hr over 30 Minutes Intravenous  Once 03/13/22 0457 03/13/22 0835         Subjective:  Patient seen and examined.  Husband at bedside.  Patient states that she is feeling better.  No other complaint.  Objective: Vitals:   03/13/22 2058 03/13/22 2357 03/14/22 0453 03/14/22 0723  BP: 108/64  (!) 149/77 125/84  Pulse: 90  86 88  Resp: 15  16 17   Temp: 98.7 F (37.1 C)  97.8 F (36.6 C) 98.3 F (36.8 C)  TempSrc: Oral  Oral   SpO2: 94%  94% 93%  Weight:  110.9 kg    Height:  5\' 3"  (1.6 m)      Intake/Output Summary (Last 24 hours) at 03/14/2022 1050 Last data filed at 03/14/2022 0900 Gross per 24 hour  Intake 340 ml  Output --  Net 340 ml    Filed Weights   03/13/22 1000 03/13/22 2357  Weight: 106 kg 110.9 kg    Examination:  General exam: Appears calm and comfortable  Respiratory system: Clear to auscultation. Respiratory effort normal. Cardiovascular system: S1 & S2 heard, RRR. No JVD, murmurs, rubs, gallops or clicks. No pedal edema. Gastrointestinal system: Abdomen is nondistended, soft and nontender. No organomegaly or masses felt. Normal bowel sounds  heard. Central nervous system: Alert and oriented. No focal neurological deficits. Extremities: Dry send at the left foot. Psychiatry: Judgement and insight appear normal. Mood & affect appropriate.    Data Reviewed: I have personally reviewed following labs and imaging studies  CBC: Recent Labs  Lab 03/12/22 1309 03/13/22 0647 03/14/22 0506  WBC 9.1 8.7 7.6  NEUTROABS 6.1  --  4.2  HGB 13.6 12.9 13.6  HCT 40.0 39.2 39.6  MCV 103.9* 107.1* 104.2*  PLT 212 193 223    Basic Metabolic Panel: Recent Labs  Lab 03/12/22 1309 03/13/22 0647 03/14/22 0506  NA 135 131* 136  K  5.3* 4.4 4.4  CL 96* 96* 101  CO2 28 20* 26  GLUCOSE 217* 250* 191*  BUN 10 15 14   CREATININE 1.17* 1.26* 1.20*  CALCIUM 9.5 8.6* 9.2    GFR: Estimated Creatinine Clearance: 56.7 mL/min (A) (by C-G formula based on SCr of 1.2 mg/dL (H)). Liver Function Tests: Recent Labs  Lab 03/12/22 1309 03/14/22 0506  AST 58* 62*  ALT 54* 43  ALKPHOS 64 61  BILITOT 0.8 0.7  PROT 6.5 6.1*  ALBUMIN 3.5 3.0*    No results for input(s): "LIPASE", "AMYLASE" in the last 168 hours. No results for input(s): "AMMONIA" in the last 168 hours. Coagulation Profile: No results for input(s): "INR", "PROTIME" in the last 168 hours. Cardiac Enzymes: No results for input(s): "CKTOTAL", "CKMB", "CKMBINDEX", "TROPONINI" in the last 168 hours. BNP (last 3 results) No results for input(s): "PROBNP" in the last 8760 hours. HbA1C: Recent Labs    03/13/22 1013  HGBA1C 8.0*   CBG: Recent Labs  Lab 03/13/22 0833 03/13/22 1134 03/13/22 1613 03/13/22 2228 03/14/22 0721  GLUCAP 209* 241* 212* 171* 237*    Lipid Profile: No results for input(s): "CHOL", "HDL", "LDLCALC", "TRIG", "CHOLHDL", "LDLDIRECT" in the last 72 hours. Thyroid Function Tests: No results for input(s): "TSH", "T4TOTAL", "FREET4", "T3FREE", "THYROIDAB" in the last 72 hours. Anemia Panel: No results for input(s): "VITAMINB12", "FOLATE", "FERRITIN",  "TIBC", "IRON", "RETICCTPCT" in the last 72 hours. Sepsis Labs: No results for input(s): "PROCALCITON", "LATICACIDVEN" in the last 168 hours.  Recent Results (from the past 240 hour(s))  Aerobic Culture w Gram Stain (superficial specimen)     Status: None (Preliminary result)   Collection Time: 03/13/22  5:29 PM   Specimen: Wound  Result Value Ref Range Status   Specimen Description WOUND  Final   Special Requests LEFT FOOT  Final   Gram Stain   Final    RARE WBC PRESENT, PREDOMINANTLY MONONUCLEAR FEW GRAM POSITIVE COCCI IN CLUSTERS    Culture   Final    TOO YOUNG TO READ Performed at [facility], [place] [address], [place], [place] [address]    Report Status PENDING  Incomplete  MRSA Next Gen by PCR, Nasal     Status: None   Collection Time: 03/13/22  9:05 PM  Result Value Ref Range Status   MRSA by PCR Next Gen NOT DETECTED NOT DETECTED Final    Comment: (NOTE) The GeneXpert MRSA Assay (FDA approved for NASAL specimens only), is one component of a comprehensive MRSA colonization surveillance program. It is not intended to diagnose MRSA infection nor to guide or monitor treatment for MRSA infections. Test performance is not FDA approved in patients less than 68 years old. Performed at [facility], [place] [address] [place], [place] [address]      Radiology Studies: VAS [place] ABI WITH/WO TBI  Result Date: 03/13/2022  LOWER EXTREMITY DOPPLER STUDY Patient Name:  Traci Harper  Date of Exam:   03/13/2022 Medical Rec #: [id number]       Accession #:    [id number] Date of Birth: 07-28-1955       Patient Gender: F Patient Age:   27 years Exam Location:  [facility] Procedure:      VAS [place] ABI WITH/WO TBI Referring Phys: Jennette Kettle --------------------------------------------------------------------------------  Indications: Ulceration. Left heel blister, concern for pre-ulcerative process. High Risk         Hypertension, hyperlipidemia, Diabetes, past history of  Factors:  smoking.  Comparison Study: No prior studies. Performing Technologist: Jean Rosenthal RDMS RVT  Examination Guidelines: A complete evaluation includes at minimum, Doppler waveform signals and systolic blood pressure reading at the level of bilateral brachial, anterior tibial, and posterior tibial arteries, when vessel segments are accessible. Bilateral testing is considered an integral part of a complete examination. Photoelectric Plethysmograph (PPG) waveforms and toe systolic pressure readings are included as required and additional duplex testing as needed. Limited examinations for reoccurring indications may be performed as noted.  ABI Findings: +---------+------------------+-----+---------+--------+ Right    Rt Pressure (mmHg)IndexWaveform Comment  +---------+------------------+-----+---------+--------+ Brachial 130                    triphasic         +---------+------------------+-----+---------+--------+ PTA      134               1.02 triphasic         +---------+------------------+-----+---------+--------+ DP       127               0.97 triphasic         +---------+------------------+-----+---------+--------+ Great Toe110               0.84 Normal            +---------+------------------+-----+---------+--------+ +---------+------------------+-----+-----------+-------+ Left     Lt Pressure (mmHg)IndexWaveform   Comment +---------+------------------+-----+-----------+-------+ Brachial 131                    triphasic          +---------+------------------+-----+-----------+-------+ PTA      126               0.96 triphasic          +---------+------------------+-----+-----------+-------+ DP       120               0.92 multiphasic        +---------+------------------+-----+-----------+-------+ Great Toe87                0.66 Abnormal           +---------+------------------+-----+-----------+-------+  +-------+-----------+-----------+------------+------------+ ABI/TBIToday's ABIToday's TBIPrevious ABIPrevious TBI +-------+-----------+-----------+------------+------------+ Right  1.02       0.84                                +-------+-----------+-----------+------------+------------+ Left   0.96       0.66                                +-------+-----------+-----------+------------+------------+  Summary: Right: Resting right ankle-brachial index is within normal range. The right toe-brachial index is normal. Left: Resting left ankle-brachial index is within normal range. The left toe-brachial index is mildly abnormal. *See table(s) above for measurements and observations.  Electronically signed by Gerarda Fraction on 03/13/2022 at 8:08:44 PM.    Final    CT EXTREMITY LOWER LEFT W CONTRAST  Result Date: 03/13/2022 CLINICAL DATA:  66 year old diabetic female with left foot cellulitis. Increased redness pain and swelling. Rule out necrotizing fasciitis. EXAM: CT OF THE LOWER LEFT EXTREMITY WITH CONTRAST TECHNIQUE: Multidetector CT imaging of the lower left extremity was performed according to the standard protocol following intravenous contrast administration. RADIATION DOSE REDUCTION: This exam was performed according to the departmental dose-optimization program which includes automated exposure control, adjustment of the mA and/or kV  according to patient size and/or use of iterative reconstruction technique. CONTRAST:  79mL OMNIPAQUE IOHEXOL 350 MG/ML SOLN COMPARISON:  Left foot radiographs 03/12/2022. FINDINGS: Calcified peripheral vascular disease. Major vascular structures in the visible left lower extremity appear to be enhancing and patent. Above the left knee soft tissues appear negative except for muscle atrophy. Beginning at the left knee joint there is posterolateral subcutaneous swelling and stranding which become circumferential in the tib fib and continues distally. Along the left  lateral calcaneus a simple fluid density superficial soft tissue swelling of up to 3.5 x 1.5 cm (series 4, image 290) is demonstrated, and this abnormal dermal appearing edema tracks laterally and posteriorly along the posterior foot as seen on series 9, image 93. Underlying subcutaneous stranding in the visible foot. But no soft tissue gas in the lower extremity. And no other fluid collection. Distal femur is intact. Patella is intact. No knee joint effusion. Tibial plateau and proximal fibula are intact. Tibia and fibula shafts appear intact. Distal tibia and fibula appear intact. Normal mortise joint alignment. Talus is intact. No ankle joint effusion. Calcaneus appears intact. No fracture or cortical osteolysis identified. IMPRESSION: 1. Posterior left foot Dermal fluid collection tracking around the posterolateral calcaneus (series 4, image 290). Underlying subcutaneous and generalized tib-fib Cellulitis. But no soft tissue gas or CT evidence of osteomyelitis. And no subcutaneous or deep fluid collection. 2. Calcified peripheral vascular disease. Electronically Signed   By: Odessa Fleming M.D.   On: 03/13/2022 05:59   DG Foot Complete Left  Result Date: 03/12/2022 CLINICAL DATA:  Left foot cellulitis.  Diabetic. EXAM: LEFT FOOT - COMPLETE 3+ VIEW COMPARISON:  None Available. FINDINGS: Moderate diffuse soft tissue edema. No signs of acute fracture or dislocation. Plantar and posterior calcaneal heel spurs noted. No bone erosions noted. IMPRESSION: 1. Moderate diffuse soft tissue edema. 2. No acute bone abnormality. Electronically Signed   By: Signa Kell M.D.   On: 03/12/2022 13:25    Scheduled Meds:  apixaban  5 mg Oral BID   insulin aspart  0-15 Units Subcutaneous TID WC   insulin glargine-yfgn  10 Units Subcutaneous Daily   irbesartan  37.5 mg Oral Daily   levothyroxine  150 mcg Oral Q0600   metoprolol tartrate  50 mg Oral BID   mycophenolate  360 mg Oral BID   tacrolimus  0.5 mg Oral BID    Continuous Infusions:  ceFEPime (MAXIPIME) IV 2 g (03/14/22 0930)   vancomycin 1,000 mg (03/14/22 0605)     LOS: 0 days   Hughie Closs, MD [facility]  03/14/2022, 10:50 AM   *Please note that this is a verbal dictation therefore any spelling or grammatical errors are due to the "Dragon Medical One" system interpretation.  Please page via Amion and do not message via secure chat for urgent patient care matters. Secure chat can be used for non urgent patient care matters.  How to contact the [facility] Attending or Consulting provider 7A - 7P or covering provider during after hours 7P -7A, for this patient?  Check the care team in [facility] and look for a) attending/consulting [facility] provider listed and b) the [facility] team listed. Page or secure chat 7A-7P. Log into [email or web address] and use [facility] universal password to access. If you do not have the password, please contact the hospital operator. Locate the [facility] provider you are looking for under [facility] and page to a number that you can be directly reached. If you still have difficulty reaching  the provider, please page the [facility] (Director on Call) for the Hospitalists listed on amion for assistance.

## 2022-03-14 NOTE — Plan of Care (Signed)

## 2022-03-14 NOTE — Progress Notes (Signed)
   PODIATRY PROGRESS NOTE Patient Name: Traci Harper  DOB 10-17-1955 DOA 03/12/2022  Hospital Day: 3  Assessment:  66 y.o. female with PMHx significant for liver transplant 2014, DM2, HTN, PEs on Eliquis  with cellulitis of the LLE 2/2 infected friction blister/bullae posterior heel. Now s/p bedside I&D of the bullae. Progressing with IV abx and wound care.    AF, VSS  WBC: 7.6 ESR/CRP: 18/10.9  Wound/Bone Cultures: Wound cx with Staph aureus, pending  Imaging:  CT LLE W contrast 10/12 Posterior left foot Dermal fluid collection tracking around the posterolateral calcaneus (series 4, image 290). Underlying subcutaneous and generalized tib-fib Cellulitis. But no soft tissue gas or CT evidence of osteomyelitis. And no subcutaneous or deep fluid collection. Plan:  - Continue local wound care. No current surgical plans. QOD dressing  changes with betadine wet to dry dressing applied with nonadherent contact layer. Dsg next changed tomorrow vs sunday - Continue broad spectrum Abx  - NWB to the LLE  Will continue to follow        Everitt Amber, [place]    Subjective:  Patient seen bedside doing well. Thinks redness in leg decreasing slightly. States pain controlled to left heel. Has been floating it off pillows as instructed.   Objective:   Vitals:   03/14/22 0723 03/14/22 1429  BP: 125/84 118/74  Pulse: 88 81  Resp: 17 18  Temp: 98.3 F (36.8 C) 98.1 F (36.7 C)  SpO2: 93% 95%       Latest Ref Rng & Units 03/14/2022    5:06 AM 03/13/2022    6:47 AM 03/12/2022    1:09 PM  CBC  WBC 4.0 - 10.5 K/uL 7.6  8.7  9.1   Hemoglobin 12.0 - 15.0 g/dL 13.6  12.9  13.6   Hematocrit 36.0 - 46.0 % 39.6  39.2  40.0   Platelets 150 - 400 K/uL 223  193  212        Latest Ref Rng & Units 03/14/2022    5:06 AM 03/13/2022    6:47 AM 03/12/2022    1:09 PM  BMP  Glucose 70 - 99 mg/dL 191  250  217   BUN 8 - 23 mg/dL _0 Creatinine 0.44 -  1.00 mg/dL 1.20  1.26  1.17   Sodium 135 - 145 mmol/L 136  131  135   Potassium 3.5 - 5.1 mmol/L 4.4  4.4  5.3   Chloride 98 - 111 mmol/L 101  96  96   CO2 22 - 32 mmol/L _1 Calcium 8.9 - 10.3 mg/dL 9.2  8.6  9.5     General: AAOx3, NAD  Lower Extremity Exam Vasc:   L - PT palpable, DP palpable. Cap refill <3 sec to digits  Derm: : Left heel ulcer stable, appears to have granular tissue ingrowing. Erythema to leg decreasing, still significant erythema to foot.   MSK:  Tender to palpation left heel  Neuro:    L - Gross sensation intact. Gross motor function intact   Radiology:  Results reviewed. See assessment for pertinent imaging results

## 2022-03-14 NOTE — Progress Notes (Signed)
Initial Nutrition Assessment  DOCUMENTATION CODES:   Morbid obesity  INTERVENTION:  1 packet Juven BID, each packet provides 95 calories, 2.5 grams of protein (collagen), and 9.8 grams of carbohydrate (3 grams sugar); also contains 7 grams of L-arginine and L-glutamine, 300 mg vitamin C, 15 mg vitamin E, 1.2 mcg vitamin B-12, 9.5 mg zinc, 200 mg calcium, and 1.5 g  Calcium Beta-hydroxy-Beta-methylbutyrate to support wound healing  30 ml ProSource Plus TID, each supplement provides 100 kcals and 15 grams protein.   NUTRITION DIAGNOSIS:   Increased nutrient needs related to wound healing as evidenced by estimated needs.  GOAL:   Patient will meet greater than or equal to 90% of their needs  MONITOR:   PO intake, Supplement acceptance, Labs, Weight trends  REASON FOR ASSESSMENT:   Consult Wound healing  ASSESSMENT:   Pt admitted with foot pain r/t diabetic infection of L foot. PMH significant for liver transplant 2014, T2DM, HTN, PE on Eliquis.  Per Vascular Surgery, no indication for angiogram at this time. S/p L heel bullare I&D bedside with evacuation of seropurulent fluid by podiatry. No plans for surgery at this time.   Unsuccessful attempt to reach pt via phone call to room. Unable to obtain detail nutrition related history at this time.   Meal completions: 10/13: 75% breakfast  Given presence of foot wound, pt would likely benefit from addition of nutrition supplements to support wound healing. Will add and adjust supplements as necessary at follow up.  Reviewed weight history. No significant weight loss noted within the last year. Current weight noted to be 110.9 kg.   Edema: non-pitting RLE, mild pitting LLE  Medications: SSI 0-15 units TID, semglee 10 units daily, IV abx  Labs: Cr 1.20, AST 62. GFR 51,  HgbA1c 8.0%, CBG's 171-241 x24hours  NUTRITION - FOCUSED PHYSICAL EXAM: RD working remotely. Deferred to follow up.   Diet Order:   Diet Order              Diet Carb Modified Fluid consistency: Thin; Room service appropriate? Yes  Diet effective now                   EDUCATION NEEDS:   No education needs have been identified at this time  Skin:  Skin Assessment: Reviewed RN Assessment (per WOC: Left foot wounds (heel with blood filled blister (DTPI) and pre ulcerative callus)  Last BM:  10/12  Height:   Ht Readings from Last 1 Encounters:  03/13/22 5\' 3"  (1.6 m)    Weight:   Wt Readings from Last 1 Encounters:  03/13/22 110.9 kg    Ideal Body Weight:  52.3 kg  BMI:  Body mass index is 43.29 kg/m.  Estimated Nutritional Needs:   Kcal:  1450-1650  Protein:  75-90g  Fluid:  >/=1.5L  Clayborne Dana, RDN, LDN Clinical Nutrition

## 2022-03-15 LAB — GLUCOSE, CAPILLARY
Glucose-Capillary: 232 mg/dL — ABNORMAL HIGH (ref 70–99)
Glucose-Capillary: 249 mg/dL — ABNORMAL HIGH (ref 70–99)
Glucose-Capillary: 232 mg/dL — ABNORMAL HIGH (ref 70–99)
Glucose-Capillary: 226 mg/dL — ABNORMAL HIGH (ref 70–99)

## 2022-03-15 MED ORDER — HYDROCORTISONE 1 % EX CREA
TOPICAL_CREAM | Freq: Two times a day (BID) | CUTANEOUS | Status: DC | PRN
  Filled 2022-03-15: qty 28

## 2022-03-15 MED ORDER — DIPHENHYDRAMINE HCL 50 MG/ML IJ SOLN
12.5000 mg | Freq: Four times a day (QID) | INTRAMUSCULAR | Status: DC | PRN
  Administered 2022-03-15: 12.5 mg via INTRAVENOUS
  Filled 2022-03-15: qty 1

## 2022-03-15 MED ORDER — ORAL CARE MOUTH RINSE: 15.0000 mL | OROMUCOSAL | Status: DC | PRN

## 2022-03-15 MED ORDER — INSULIN GLARGINE-YFGN 100 UNIT/ML ~~LOC~~ SOLN
20.0000 [IU] | Freq: Every day | SUBCUTANEOUS | Status: DC
  Administered 2022-03-15 – 2022-03-16 (×2): 20 [IU] via SUBCUTANEOUS
  Filled 2022-03-15 (×2): qty 0.2

## 2022-03-15 NOTE — Progress Notes (Signed)
PROGRESS NOTE    Traci Harper  QBH:[id number] DOB: [birth year] DOA: 03/12/2022 PCP: Vivi Barrack, MD   Brief Narrative:  HPI: Traci Harper is a 66 y.o. female with medical history significant of liver transplant 2014, DM2, HTN, PEs on Eliquis.   Pt presents to the ED with c/o pain and swelling in L foot. Onset ~4 days ago coming from a callus area in heel of L foot.  Went to PCP on 10/10 who started her on doxycycline and referred her to podiatry.   Despite 2 days of doxycycline cellulitis seems to have worsened, now with erythema going up to mid calf area on front of leg and over past 24h development of a large bullae to back of ankle just above her heel (photo in physical exam below).   She says she had significant pain going up to knee though this has improved over past few hours.   She has no fevers, chills sweats.    Assessment & Plan:   Principal Problem:   Diabetic infection of left foot ([place]) Active Problems:   Liver transplanted 2014 [facility]) follows with [facility]   Hypertension associated with diabetes ([place])   Type 2 diabetes mellitus with hyperglycemia ([place])   Pulmonary embolism ([place])   Foot infection  Diabetic foot infection left heel/possible abscess seen/bulla/cellulitis of the left foot and lower part of the left lower extremity: Podiatry and vascular consulted.  Underwent bedside incision and drainage on 03/13/2022.  Has dressing in place.  Cellulitis/erythema appears to be improving.  Discussed with podiatry, they would like for her to receive another day of IV antibiotics today, they will redo the dressing tomorrow with potential plan to discharge home tomorrow on oral antibiotics.   Seen by vascular surgery, ABI completed, per vascular, she has sufficient blood flow for wound healing, no indication of angiogram.  History of PE and DVTs: Continue Eliquis.  Type 2 diabetes mellitus: Hold metformin.  Continue Trulicity and SSI.  Blood sugar elevated, increase  Semglee to 20 units.  Essential hypertension: Controlled.  Continue metoprolol and Benicar.  Acute hyponatremia: Resolved  CKD stage IIIa: At baseline.  Hyperkalemia: Resolved.  Acquired hypothyroidism: Continue Synthroid.  Liver transplanted 2014 [facility]) follows with [facility]: Very mild transaminitis with normal bilirubin, LFTs actually look improved  compared to in May of this year when she last saw transplant hepatologist.  Work up at that time only showed fatty liver as far as the liver was concerned (incedentally the CT scan that time also found small amount of pulmonary emboli for which she was started on eliquis). Continue myfortic and prograf (confirmed dosing with patient). Patient is not on prednisone and hasnt been since shortly after surgery she states Follow up with transplant hepatologist.  DVT prophylaxis:   Eliquis   Code Status: Full Code  Family Communication:  None present at bedside.  Plan of care discussed with patient in length and he/she verbalized understanding and agreed with it.  Also discussed with husband over the phone in her room.  Status is: Inpatient Remains inpatient appropriate because: Needs more IV antibiotics.  Potential discharge tomorrow.    Estimated body mass index is 43.29 kg/m as calculated from the following:   Height as of this encounter: 5\' 3"  (1.6 m).   Weight as of this encounter: 110.9 kg.    Nutritional Assessment: Body mass index is 43.29 kg/m.Marland Kitchen Seen by dietician.  I agree with the assessment and plan as outlined below: Nutrition Status: Nutrition Problem: Increased  nutrient needs Etiology: wound healing Signs/Symptoms: estimated needs Interventions: Juven, Prostat  . Skin Assessment: I have examined the patient's skin and I agree with the wound assessment as performed by the wound care RN as outlined below:    Consultants:  Vascular surgery Podiatry  Procedures:  As above  Antimicrobials:  Anti-infectives (From  admission, onward)    Start     Dose/Rate Route Frequency Ordered Stop   03/14/22 0500  vancomycin (VANCOCIN) IVPB 1000 mg/200 mL premix        1,000 mg 200 mL/hr over 60 Minutes Intravenous Every 24 hours 03/13/22 1033     03/13/22 1800  ceFEPIme (MAXIPIME) 2 g in sodium chloride 0.9 % 100 mL IVPB        2 g 200 mL/hr over 30 Minutes Intravenous Every 12 hours 03/13/22 1033     03/13/22 1030  vancomycin (VANCOCIN) IVPB 1000 mg/200 mL premix  Status:  Discontinued        1,000 mg 200 mL/hr over 60 Minutes Intravenous  Once 03/13/22 1023 03/13/22 1024   03/13/22 1030  ceFEPIme (MAXIPIME) 2 g in sodium chloride 0.9 % 100 mL IVPB  Status:  Discontinued        2 g 200 mL/hr over 30 Minutes Intravenous  Once 03/13/22 1023 03/13/22 1024   03/13/22 0600  ceFAZolin (ANCEF) IVPB 2g/100 mL premix  Status:  Discontinued        2 g 200 mL/hr over 30 Minutes Intravenous Every 8 hours 03/13/22 0415 03/13/22 0457   03/13/22 0500  vancomycin (VANCOCIN) IVPB 1000 mg/200 mL premix        1,000 mg 200 mL/hr over 60 Minutes Intravenous  Once 03/13/22 0457 03/13/22 0634   03/13/22 0500  ceFEPIme (MAXIPIME) 2 g in sodium chloride 0.9 % 100 mL IVPB        2 g 200 mL/hr over 30 Minutes Intravenous  Once 03/13/22 0457 03/13/22 0835         Subjective:  Seen and examined.  Pain is very well controlled.  She has no complaints.  Objective: Vitals:   03/14/22 1429 03/14/22 1938 03/15/22 0427 03/15/22 0758  BP: 118/74 (!) 143/77 (!) 147/77 120/73  Pulse: 81 91 81 86  Resp: 18 15 16 19   Temp: 98.1 F (36.7 C) 98.9 F (37.2 C) 98 F (36.7 C) 98.3 F (36.8 C)  TempSrc:  Oral Oral   SpO2: 95% 95% 94% 94%  Weight:      Height:        Intake/Output Summary (Last 24 hours) at 03/15/2022 1142 Last data filed at 03/14/2022 1500 Gross per 24 hour  Intake 360 ml  Output --  Net 360 ml    Filed Weights   03/13/22 1000 03/13/22 2357  Weight: 106 kg 110.9 kg    Examination:  General exam:  Appears calm and comfortable  Respiratory system: Clear to auscultation. Respiratory effort normal. Cardiovascular system: S1 & S2 heard, RRR. No JVD, murmurs, rubs, gallops or clicks. No pedal edema. Gastrointestinal system: Abdomen is nondistended, soft and nontender. No organomegaly or masses felt. Normal bowel sounds heard. Central nervous system: Alert and oriented. No focal neurological deficits. Extremities: Symmetric 5 x 5 power. Skin: Dressing left foot. Psychiatry: Judgement and insight appear normal. Mood & affect appropriate.    Data Reviewed: I have personally reviewed following labs and imaging studies  CBC: Recent Labs  Lab 03/12/22 1309 03/13/22 0647 03/14/22 0506  WBC 9.1 8.7 7.6  NEUTROABS 6.1  --  4.2  HGB 13.6 12.9 13.6  HCT 40.0 39.2 39.6  MCV 103.9* 107.1* 104.2*  PLT 212 193 223    Basic Metabolic Panel: Recent Labs  Lab 03/12/22 1309 03/13/22 0647 03/14/22 0506  NA 135 131* 136  K 5.3* 4.4 4.4  CL 96* 96* 101  CO2 28 20* 26  GLUCOSE 217* 250* 191*  BUN 10 15 14   CREATININE 1.17* 1.26* 1.20*  CALCIUM 9.5 8.6* 9.2    GFR: Estimated Creatinine Clearance: 56.7 mL/min (A) (by C-G formula based on SCr of 1.2 mg/dL (H)). Liver Function Tests: Recent Labs  Lab 03/12/22 1309 03/14/22 0506  AST 58* 62*  ALT 54* 43  ALKPHOS 64 61  BILITOT 0.8 0.7  PROT 6.5 6.1*  ALBUMIN 3.5 3.0*    No results for input(s): "LIPASE", "AMYLASE" in the last 168 hours. No results for input(s): "AMMONIA" in the last 168 hours. Coagulation Profile: No results for input(s): "INR", "PROTIME" in the last 168 hours. Cardiac Enzymes: No results for input(s): "CKTOTAL", "CKMB", "CKMBINDEX", "TROPONINI" in the last 168 hours. BNP (last 3 results) No results for input(s): "PROBNP" in the last 8760 hours. HbA1C: Recent Labs    03/13/22 1013  HGBA1C 8.0*    CBG: Recent Labs  Lab 03/14/22 0721 03/14/22 1220 03/14/22 1605 03/14/22 1939 03/15/22 0800  GLUCAP  237* 208* 159* 230* 232*    Lipid Profile: No results for input(s): "CHOL", "HDL", "LDLCALC", "TRIG", "CHOLHDL", "LDLDIRECT" in the last 72 hours. Thyroid Function Tests: No results for input(s): "TSH", "T4TOTAL", "FREET4", "T3FREE", "THYROIDAB" in the last 72 hours. Anemia Panel: No results for input(s): "VITAMINB12", "FOLATE", "FERRITIN", "TIBC", "IRON", "RETICCTPCT" in the last 72 hours. Sepsis Labs: No results for input(s): "PROCALCITON", "LATICACIDVEN" in the last 168 hours.  Recent Results (from the past 240 hour(s))  Aerobic Culture w Gram Stain (superficial specimen)     Status: None (Preliminary result)   Collection Time: 03/13/22  5:29 PM   Specimen: Wound  Result Value Ref Range Status   Specimen Description WOUND  Final   Special Requests LEFT FOOT  Final   Gram Stain   Final    RARE WBC PRESENT, PREDOMINANTLY MONONUCLEAR FEW GRAM POSITIVE COCCI IN CLUSTERS    Culture   Final    MODERATE STAPHYLOCOCCUS AUREUS CULTURE REINCUBATED FOR BETTER GROWTH Performed at [facility], [address] [place], [place]    Report Status PENDING  Incomplete  MRSA Next Gen by PCR, Nasal     Status: None   Collection Time: 03/13/22  9:05 PM  Result Value Ref Range Status   MRSA by PCR Next Gen NOT DETECTED NOT DETECTED Final    Comment: (NOTE) The GeneXpert MRSA Assay (FDA approved for NASAL specimens only), is one component of a comprehensive MRSA colonization surveillance program. It is not intended to diagnose MRSA infection nor to guide or monitor treatment for MRSA infections. Test performance is not FDA approved in patients less than 20 years old. Performed at [facility], [address] [place], [place]      Radiology Studies: VAS [id number] ABI WITH/WO TBI  Result Date: 03/13/2022  LOWER EXTREMITY DOPPLER STUDY Patient Name:  NIHARIKA SAVINO  Date of Exam:   03/13/2022 Medical Rec #: 05/13/2022       Accession #:    [id number] Date of Birth:  Nov 07, 1955       Patient Gender: F Patient Age:   71 years Exam [facility]  [facility]  [facility] Procedure:      VAS US ABI WITH/WO TBI Referring Phys: JARED GARDNER --------------------------------------------------------------------------------  Indications: Ulceration. Left heel blister, concern for pre-ulcerative process. High Risk         Hypertension, hyperlipidemia, Diabetes, past history of Factors:          smoking.  Comparison Study: No prior studies. Performing Technologist: Jean RosenthalHodge, [place] RDMS RVT  Examination Guidelines: A complete evaluation includes at minimum, Doppler waveform signals and systolic blood pressure reading at the level of bilateral brachial, anterior tibial, and posterior tibial arteries, when vessel segments are accessible. Bilateral testing is considered an integral part of a complete examination. Photoelectric Plethysmograph (PPG) waveforms and toe systolic pressure readings are included as required and additional duplex testing as needed. Limited examinations for reoccurring indications may be performed as noted.  ABI Findings: +---------+------------------+-----+---------+--------+ Right    Rt Pressure (mmHg)IndexWaveform Comment  +---------+------------------+-----+---------+--------+ Brachial 130                    triphasic         +---------+------------------+-----+---------+--------+ PTA      134               1.02 triphasic         +---------+------------------+-----+---------+--------+ DP       127               0.97 triphasic         +---------+------------------+-----+---------+--------+ Great Toe110               0.84 Normal            +---------+------------------+-----+---------+--------+ +---------+------------------+-----+-----------+-------+ Left     Lt Pressure (mmHg)IndexWaveform   Comment +---------+------------------+-----+-----------+-------+ Brachial 131                    triphasic           +---------+------------------+-----+-----------+-------+ PTA      126               0.96 triphasic          +---------+------------------+-----+-----------+-------+ DP       120               0.92 multiphasic        +---------+------------------+-----+-----------+-------+ Great Toe87                0.66 Abnormal           +---------+------------------+-----+-----------+-------+ +-------+-----------+-----------+------------+------------+ ABI/TBIToday's ABIToday's TBIPrevious ABIPrevious TBI +-------+-----------+-----------+------------+------------+ Right  1.02       0.84                                +-------+-----------+-----------+------------+------------+ Left   0.96       0.66                                +-------+-----------+-----------+------------+------------+  Summary: Right: Resting right ankle-brachial index is within normal range. The right toe-brachial index is normal. Left: Resting left ankle-brachial index is within normal range. The left toe-brachial index is mildly abnormal. *See table(s) above for measurements and observations.  Electronically signed by Gerarda FractionJoshua Robins on 03/13/2022 at 8:08:44 PM.    Final     Scheduled Meds:  (feeding supplement) PROSource Plus  30 mL Oral TID BM   apixaban  5 mg Oral BID   insulin aspart  0-15  Units Subcutaneous TID WC   insulin glargine-yfgn  20 Units Subcutaneous Daily   irbesartan  37.5 mg Oral Daily   levothyroxine  150 mcg Oral Q0600   metoprolol tartrate  50 mg Oral BID   mycophenolate  360 mg Oral BID   nutrition supplement (JUVEN)  1 packet Oral BID BM   tacrolimus  0.5 mg Oral BID   Continuous Infusions:  ceFEPime (MAXIPIME) IV 2 g (03/15/22 1021)   vancomycin 1,000 mg (03/15/22 0510)     LOS: 1 day   Hughie Closs, MD [facility]  03/15/2022, 11:42 AM   *Please note that this is a verbal dictation therefore any spelling or grammatical errors are due to the "Dragon Medical One" system  interpretation.  Please page via Amion and do not message via secure chat for urgent patient care matters. Secure chat can be used for non urgent patient care matters.  How to contact the [facility] Attending or Consulting provider 7A - 7P or covering provider during after hours 7P -7A, for this patient?  Check the care team in [facility] and look for a) attending/consulting [facility] provider listed and b) the [facility] team listed. Page or secure chat 7A-7P. Log into [email or web address] and use [place]'s universal password to access. If you do not have the password, please contact the hospital operator. Locate the [facility] provider you are looking for under [facility] and page to a number that you can be directly reached. If you still have difficulty reaching the provider, please page the [facility] (Director on Call) for the Hospitalists listed on amion for assistance.

## 2022-03-15 NOTE — Plan of Care (Signed)

## 2022-03-16 LAB — CBC WITH DIFFERENTIAL/PLATELET
Abs Immature Granulocytes: 0.05 10*3/uL (ref 0.00–0.07)
Basophils Absolute: 0.1 10*3/uL (ref 0.0–0.1)
Basophils Relative: 1 %
Eosinophils Absolute: 0.4 10*3/uL (ref 0.0–0.5)
Eosinophils Relative: 6 %
HCT: 41.8 % (ref 36.0–46.0)
Hemoglobin: 13.8 g/dL (ref 12.0–15.0)
Immature Granulocytes: 1 %
Lymphocytes Relative: 36 %
Lymphs Abs: 2.4 10*3/uL (ref 0.7–4.0)
MCH: 34.8 pg — ABNORMAL HIGH (ref 26.0–34.0)
MCHC: 33 g/dL (ref 30.0–36.0)
MCV: 105.6 fL — ABNORMAL HIGH (ref 80.0–100.0)
Monocytes Absolute: 0.5 10*3/uL (ref 0.1–1.0)
Monocytes Relative: 7 %
Neutro Abs: 3.3 10*3/uL (ref 1.7–7.7)
Neutrophils Relative %: 49 %
Platelets: 254 10*3/uL (ref 150–400)
RBC: 3.96 MIL/uL (ref 3.87–5.11)
RDW: 12.6 % (ref 11.5–15.5)
WBC: 6.6 10*3/uL (ref 4.0–10.5)
nRBC: 0 % (ref 0.0–0.2)

## 2022-03-16 LAB — BASIC METABOLIC PANEL
Anion gap: 13 (ref 5–15)
Anion gap: 11 (ref 5–15)
BUN: 28 mg/dL — ABNORMAL HIGH (ref 8–23)
BUN: 28 mg/dL — ABNORMAL HIGH (ref 8–23)
CO2: 26 mmol/L (ref 22–32)
CO2: 26 mmol/L (ref 22–32)
Calcium: 9.8 mg/dL (ref 8.9–10.3)
Calcium: 9.1 mg/dL (ref 8.9–10.3)
Chloride: 101 mmol/L (ref 98–111)
Chloride: 97 mmol/L — ABNORMAL LOW (ref 98–111)
Creatinine, Ser: 1.22 mg/dL — ABNORMAL HIGH (ref 0.44–1.00)
Creatinine, Ser: 1.21 mg/dL — ABNORMAL HIGH (ref 0.44–1.00)
GFR, Estimated: 50 mL/min — ABNORMAL LOW (ref >60)
GFR, Estimated: 50 mL/min — ABNORMAL LOW (ref >60)
Glucose, Bld: 211 mg/dL — ABNORMAL HIGH (ref 70–99)
Glucose, Bld: 320 mg/dL — ABNORMAL HIGH (ref 70–99)
Potassium: 5.4 mmol/L — ABNORMAL HIGH (ref 3.5–5.1)
Potassium: 4.7 mmol/L (ref 3.5–5.1)
Sodium: 140 mmol/L (ref 135–145)
Sodium: 134 mmol/L — ABNORMAL LOW (ref 135–145)

## 2022-03-16 LAB — GLUCOSE, CAPILLARY
Glucose-Capillary: 294 mg/dL — ABNORMAL HIGH (ref 70–99)
Glucose-Capillary: 228 mg/dL — ABNORMAL HIGH (ref 70–99)

## 2022-03-16 LAB — AEROBIC CULTURE W GRAM STAIN (SUPERFICIAL SPECIMEN)

## 2022-03-16 LAB — POTASSIUM: Potassium: 4.5 mmol/L (ref 3.5–5.1)

## 2022-03-16 MED ORDER — SODIUM ZIRCONIUM CYCLOSILICATE 10 G PO PACK
10.0000 g | PACK | Freq: Once | ORAL | Status: AC
  Administered 2022-03-16: 10 g via ORAL
  Filled 2022-03-16: qty 1

## 2022-03-16 MED ORDER — DOXYCYCLINE HYCLATE 100 MG PO TABS: 100.0000 mg | ORAL_TABLET | Freq: Two times a day (BID) | ORAL | 0 refills | Status: DC

## 2022-03-16 MED ORDER — OXYCODONE HCL 5 MG PO TABS: 5.0000 mg | ORAL_TABLET | ORAL | 0 refills | Status: DC | PRN

## 2022-03-16 NOTE — Progress Notes (Signed)
Orthopedic Tech Progress Note Patient Details:  Traci Harper 06/14/1955 [id number]  PO shoe dropped off to pt room. Chose the shoe that best fit her foot given the bulky dressing. Pt and husband at bedside understand how to apply/adjust/remove the shoe.  Ortho Devices Type of Ortho Device: Postop shoe/boot Ortho Device/Splint Location: For LLE, with pt's husband Ortho Device/Splint Interventions: Ordered, Adjustment   Post Interventions Patient Tolerated: Well Instructions Provided: Care of device, Adjustment of device  Traci Harper Jeri Modena 03/16/2022, 3:45 PM

## 2022-03-16 NOTE — Plan of Care (Signed)

## 2022-03-16 NOTE — Plan of Care (Signed)
Problem: Education: Goal: Ability to describe self-care measures that may prevent or decrease complications (Diabetes Survival Skills Education) will improve 03/16/2022 1308 by Jerolyn Shin, RN Outcome: Adequate for Discharge 03/16/2022 1308 by Jerolyn Shin, RN Outcome: Progressing Goal: Individualized Educational Video(s) 03/16/2022 1308 by Jerolyn Shin, RN Outcome: Adequate for Discharge 03/16/2022 1308 by Jerolyn Shin, RN Outcome: Progressing   Problem: Coping: Goal: Ability to adjust to condition or change in health will improve 03/16/2022 1308 by Jerolyn Shin, RN Outcome: Adequate for Discharge 03/16/2022 1308 by Jerolyn Shin, RN Outcome: Progressing   Problem: Fluid Volume: Goal: Ability to maintain a balanced intake and output will improve 03/16/2022 1308 by Jerolyn Shin, RN Outcome: Adequate for Discharge 03/16/2022 1308 by Jerolyn Shin, RN Outcome: Progressing   Problem: Health Behavior/Discharge Planning: Goal: Ability to identify and utilize available resources and services will improve 03/16/2022 1308 by Jerolyn Shin, RN Outcome: Adequate for Discharge 03/16/2022 1308 by Jerolyn Shin, RN Outcome: Progressing Goal: Ability to manage health-related needs will improve 03/16/2022 1308 by Jerolyn Shin, RN Outcome: Adequate for Discharge 03/16/2022 1308 by Jerolyn Shin, RN Outcome: Progressing   Problem: Metabolic: Goal: Ability to maintain appropriate glucose levels will improve 03/16/2022 1308 by Jerolyn Shin, RN Outcome: Adequate for Discharge 03/16/2022 1308 by Jerolyn Shin, RN Outcome: Progressing   Problem: Nutritional: Goal: Maintenance of adequate nutrition will improve 03/16/2022 1308 by Jerolyn Shin, RN Outcome: Adequate for Discharge 03/16/2022 1308 by Jerolyn Shin, RN Outcome: Progressing Goal: Progress toward achieving an optimal weight will improve 03/16/2022 1308 by Jerolyn Shin, RN Outcome:  Adequate for Discharge 03/16/2022 1308 by Jerolyn Shin, RN Outcome: Progressing   Problem: Skin Integrity: Goal: Risk for impaired skin integrity will decrease 03/16/2022 1308 by Jerolyn Shin, RN Outcome: Adequate for Discharge 03/16/2022 1308 by Jerolyn Shin, RN Outcome: Progressing   Problem: Tissue Perfusion: Goal: Adequacy of tissue perfusion will improve 03/16/2022 1308 by Jerolyn Shin, RN Outcome: Adequate for Discharge 03/16/2022 1308 by Jerolyn Shin, RN Outcome: Progressing   Problem: Education: Goal: Knowledge of General Education information will improve Description: Including pain rating scale, medication(s)/side effects and non-pharmacologic comfort measures 03/16/2022 1308 by Jerolyn Shin, RN Outcome: Adequate for Discharge 03/16/2022 1308 by Jerolyn Shin, RN Outcome: Progressing   Problem: Health Behavior/Discharge Planning: Goal: Ability to manage health-related needs will improve 03/16/2022 1308 by Jerolyn Shin, RN Outcome: Adequate for Discharge 03/16/2022 1308 by Jerolyn Shin, RN Outcome: Progressing   Problem: Clinical Measurements: Goal: Ability to maintain clinical measurements within normal limits will improve 03/16/2022 1308 by Jerolyn Shin, RN Outcome: Adequate for Discharge 03/16/2022 1308 by Jerolyn Shin, RN Outcome: Progressing Goal: Will remain free from infection 03/16/2022 1308 by Jerolyn Shin, RN Outcome: Adequate for Discharge 03/16/2022 1308 by Jerolyn Shin, RN Outcome: Progressing Goal: Diagnostic test results will improve 03/16/2022 1308 by Jerolyn Shin, RN Outcome: Adequate for Discharge 03/16/2022 1308 by Jerolyn Shin, RN Outcome: Progressing Goal: Respiratory complications will improve 03/16/2022 1308 by Jerolyn Shin, RN Outcome: Adequate for Discharge 03/16/2022 1308 by Jerolyn Shin, RN Outcome: Progressing Goal: Cardiovascular complication will be avoided 03/16/2022 1308  by Jerolyn Shin, RN Outcome: Adequate for Discharge 03/16/2022 1308 by Jerolyn Shin, RN Outcome: Progressing   Problem: Activity: Goal: Risk for activity intolerance will decrease 03/16/2022 1308 by Jerolyn Shin, RN Outcome: Adequate for Discharge 03/16/2022 1308 by Jerolyn Shin, RN Outcome: Progressing   Problem: Nutrition: Goal: Adequate nutrition will be maintained 03/16/2022 1308 by Jerolyn Shin, RN Outcome: Adequate for Discharge 03/16/2022 1308 by Jerolyn Shin, RN Outcome: Progressing  Problem: Coping: Goal: Level of anxiety will decrease 03/16/2022 1308 by Payton Spark, RN Outcome: Adequate for Discharge 03/16/2022 1308 by Payton Spark, RN Outcome: Progressing   Problem: Elimination: Goal: Will not experience complications related to bowel motility 03/16/2022 1308 by Payton Spark, RN Outcome: Adequate for Discharge 03/16/2022 1308 by Payton Spark, RN Outcome: Progressing Goal: Will not experience complications related to urinary retention 03/16/2022 1308 by Payton Spark, RN Outcome: Adequate for Discharge 03/16/2022 1308 by Payton Spark, RN Outcome: Progressing   Problem: Pain Managment: Goal: General experience of comfort will improve 03/16/2022 1308 by Payton Spark, RN Outcome: Adequate for Discharge 03/16/2022 1308 by Payton Spark, RN Outcome: Progressing   Problem: Safety: Goal: Ability to remain free from injury will improve 03/16/2022 1308 by Payton Spark, RN Outcome: Adequate for Discharge 03/16/2022 1308 by Payton Spark, RN Outcome: Progressing   Problem: Skin Integrity: Goal: Risk for impaired skin integrity will decrease 03/16/2022 1308 by Payton Spark, RN Outcome: Adequate for Discharge 03/16/2022 1308 by Payton Spark, RN Outcome: Progressing

## 2022-03-16 NOTE — TOC Initial Note (Signed)
Transition of Care [facility]) - Initial/Assessment Note    Patient Details  Name: Traci Harper MRN: [id number] Date of Birth: 10/15/1955  Transition of Care [facility]) CM/SW Contact:    Bess Kinds, RN Phone Number: [phone] 03/16/2022, 11:29 AM  Clinical Narrative:                  Spoke with patient and spouse at the bedside to discuss post acute transition. Confirmed insurance and forwarded information to financial navigator. Discussed home health nursing needs - spouse is teachable caregiver. Next dressing change is Tuesday. Next follow up appointment is Thursday. Patient to get boot prior to discharge to assist with minimizing weight on L foot. Patient has access to RW at home. Frances Furbish accepted referral for nursing. No further TOC needs identified at this time.     Barriers to Discharge: No Barriers Identified   Patient Goals and CMS Choice Patient states their goals for this hospitalization and ongoing recovery are:: home with CMS Medicare.gov Compare Post Acute Care list provided to:: Patient Choice offered to / list presented to : Patient  Expected Discharge Plan and Services           Expected Discharge Date: 03/16/22               DME Arranged: N/A DME Agency: NA       HH Arranged: RN          Prior Living Arrangements/Services                       Activities of Daily Living Home Assistive Devices/Equipment: CBG Meter, Cane (specify quad or straight), Bedside commode/3-in-1 ADL Screening (condition at time of admission) Patient's cognitive ability adequate to safely complete daily activities?: Yes Is the patient deaf or have difficulty hearing?: No Does the patient have difficulty seeing, even when wearing glasses/contacts?: No Does the patient have difficulty concentrating, remembering, or making decisions?: No Patient able to express need for assistance with ADLs?: Yes Does the patient have difficulty dressing or bathing?: No Independently  performs ADLs?: Yes (appropriate for developmental age) Does the patient have difficulty walking or climbing stairs?: No Weakness of Legs: None Weakness of Arms/Hands: None  Permission Sought/Granted                  Emotional Assessment              Admission diagnosis:  Elevated transaminase level [R74.01] Diabetic infection of left foot (HCC) [A35.573, L08.9] Cellulitis of left ankle [L03.116] Foot infection [L08.9] Patient Active Problem List   Diagnosis Date Noted   Foot infection 03/14/2022   Diabetic infection of left foot (HCC) 03/13/2022   Stress 10/01/2021   Dyslipidemia associated with type 2 diabetes mellitus (HCC) 10/01/2021   Pulmonary embolism (HCC) 04/02/2021   Hypertension associated with diabetes (HCC) 04/23/2020   Type 2 diabetes mellitus with hyperglycemia (HCC) 04/23/2020   Hypothyroidism 04/23/2020   GERD (gastroesophageal reflux disease) 04/23/2020   Liver transplanted 2014 [facility]) follows with [facility] 04/01/2013   PCP:  Ardith Dark, MD Pharmacy:   [facility] [address] [place], [place] - [address] [place] [address] Phone: [phone] Fax: [phone]  [id number] - [place], [place] - [address] AT [facility] [address] [place] [address] Phone: [phone] Fax: [phone]  [facility] Delivery - [place],  - [phone]  [place] [place] [address] Phone: [phone] Fax: [phone]     Social Determinants of Health (SDOH) Interventions    Readmission Risk Interventions     No data to display

## 2022-03-16 NOTE — Discharge Summary (Signed)
Physician Discharge Summary  Traci Harper:[id number] DOB: [birth year] DOA: 03/12/2022  PCP: Vivi Barrack, MD  Admit date: 03/12/2022 Discharge date: 03/16/2022 30 Day Unplanned Readmission Risk Score    Flowsheet Row ED to Hosp-Admission (Current) from 03/12/2022 in [place]  30 Day Unplanned Readmission Risk Score (%) 7.9 Filed at 03/16/2022 0801       This score is the patient's risk of an unplanned readmission within 30 days of being discharged (0 -100%). The score is based on dignosis, age, lab data, medications, orders, and past utilization.   Low:  0-14.9   Medium: 15-21.9   High: 22-29.9   Extreme: 30 and above          Admitted From: Home Disposition: Home  Recommendations for Outpatient Follow-up:  Follow up with PCP in 1-2 weeks Please obtain BMP/CBC in one week Follow-up with podiatry on Thursday, please call their office tomorrow for the time of appointment. Please follow up with your PCP on the following pending results: Unresulted Labs (From admission, onward)     Start     Ordered   03/16/22 0806  Potassium  STAT Now then every 4 hours ,   R (with STAT occurrences)     Comments: Until normal twice.   Question:  Specimen collection method  Answer:  Lab=Lab collect   03/16/22 0805              Home Health: Yes Equipment/Devices: None  Discharge Condition: Stable CODE STATUS: Full code Diet recommendation: Cardiac/diabetic  Subjective: Seen and examined with the podiatrist today.  No complaints.  She is very happy with the wound healing.  Brief/Interim Summary: HPI: Traci Harper is a 66 y.o. female with medical history significant of liver transplant 2014, DM2, HTN, PEs on Eliquis.   Pt presented to the ED with c/o pain and swelling in L foot. Onset ~4 days ago coming from a callus area in heel of L foot.  Went to PCP on 10/10 who started her on doxycycline and referred her to podiatry.   Despite 2  days of doxycycline cellulitis seems to have worsened, with erythema going up to mid calf area on front of leg and over past 24h development of a large bullae to back of ankle just above her heel (photo in physical exam below).   She was admitted under hospitalist service.  Podiatry consulted.  Diabetic foot infection left heel/possible abscess seen/bulla/cellulitis of the left foot and lower part of the left lower extremity: Podiatry and vascular consulted.  Underwent bedside incision and drainage on 03/13/2022.  Has dressing in place.  Cellulitis/erythema appears to be improving.  Wound culture is growing gram-positive cocci in clusters, final sensitivity pending but MRSA was negative in the [place].  I saw this patient today along with podiatrist, podiatrist changed her dressing, she has been thoroughly educated on how to do the dressing however we will try to arrange home health nursing for her per her request.  Podiatry recommends 10 days of oral doxycycline.  They will follow-up with her on Thursday.   Seen by vascular surgery, ABI completed, per vascular, she has sufficient blood flow for wound healing, no indication of angiogram.   History of PE and DVTs: Continue Eliquis.   Type 2 diabetes mellitus: Resume home meds.   Essential hypertension: Controlled.  Continue metoprolol and Benicar.   Acute hyponatremia: Resolved   CKD stage IIIa: At baseline.   Hyperkalemia: Resolved today but  slightly hyperkalemic today at 5.4.  She received Lokelma today.  Check BMP at PCPs visit.   Acquired hypothyroidism: Continue Synthroid.   Liver transplanted 2014 [facility]) follows with [facility]: Very mild transaminitis with normal bilirubin, LFTs actually look improved  compared to in May of this year when she last saw transplant hepatologist.  Work up at that time only showed fatty liver as far as the liver was concerned (incedentally the CT scan that time also found small amount of pulmonary emboli for which she  was started on eliquis). Continue myfortic and prograf (confirmed dosing with patient). Patient is not on prednisone and hasnt been since shortly after surgery she states Follow up with transplant hepatologist.  Discharge plan was discussed with patient and/or family member and they verbalized understanding and agreed with it.  Discharge Diagnoses:  Principal Problem:   Diabetic infection of left foot ([place]) Active Problems:   Liver transplanted 2014 [facility]) follows with [facility]   Hypertension associated with diabetes ([place])   Type 2 diabetes mellitus with hyperglycemia ([place])   Pulmonary embolism ([place])   Foot infection    Discharge Instructions   Allergies as of 03/16/2022       Reactions   Lisinopril Cough        Medication List     TAKE these medications    calcium citrate-vitamin D 315-200 MG-UNIT tablet Commonly known as: CITRACAL+D Take 1 tablet by mouth daily.   doxycycline 100 MG tablet Commonly known as: VIBRA-TABS Take 1 tablet (100 mg total) by mouth 2 (two) times daily for 10 days.   Eliquis 5 MG Tabs tablet Generic drug: apixaban Take 1 tablet (5 mg total) by mouth 2 (two) times daily.   glucose blood test strip 1 each by Other route as needed for other. Use as instructed   levothyroxine 150 MCG tablet Commonly known as: SYNTHROID Take 1 tablet (150 mcg total) by mouth daily.   magnesium oxide 400 MG tablet Commonly known as: MAG-OX Take 1 tablet (400 mg total) by mouth 2 (two) times daily. What changed:  how much to take when to take this   metFORMIN 1000 MG tablet Commonly known as: GLUCOPHAGE Take 1 tablet (1,000 mg total) by mouth 2 (two) times daily with a meal.   metoprolol tartrate 50 MG tablet Commonly known as: LOPRESSOR TAKE 1 TABLET BY MOUTH TWICE A DAY   mycophenolate 360 MG Tbec EC tablet Commonly known as: MYFORTIC Take 360 mg by mouth 2 (two) times daily.   olmesartan 5 MG tablet Commonly known as: BENICAR Take 1 tablet (5  mg total) by mouth daily.   omeprazole 20 MG capsule Commonly known as: PRILOSEC Take 1 capsule (20 mg total) by mouth daily.   One Daily tablet Take 1 tablet by mouth daily.   OneTouch Verio Flex System w/Device Kit USE   TO CHECK GLUCOSE FOR BLOOD SUGAR   oxyCODONE 5 MG immediate release tablet Commonly known as: Oxy IR/ROXICODONE Take 1 tablet (5 mg total) by mouth every 4 (four) hours as needed for severe pain.   Prograf 0.5 MG capsule Generic drug: tacrolimus Take 0.5 mg by mouth 2 (two) times daily.   Trulicity 1.5 OV/5.6EP Sopn Generic drug: Dulaglutide Inject 1.5 mg into the skin once a week. What changed: additional instructions   vitamin C 100 MG tablet Take 100 mg by mouth daily.        Follow-up Information     Vivi Barrack, MD Follow up in 1 week(s).  Specialty: Family Medicine Contact information: [address] [place] [address] [phone]                Allergies  Allergen Reactions   Lisinopril Cough    Consultations: Podiatry and vascular surgery   Procedures/Studies: VAS [place] ABI WITH/WO TBI  Result Date: 03/13/2022  [place] STUDY Patient Name:  LEATHA ROHNER  Date of Exam:   03/13/2022 Medical Rec #: [id number]       Accession #:    [id number] Date of Birth: Jun 29, 1955       Patient Gender: F Patient Age:   59 years Exam Location:  [facility] Procedure:      VAS [place] ABI WITH/WO TBI Referring Phys: Jennette Kettle --------------------------------------------------------------------------------  Indications: Ulceration. Left heel blister, concern for pre-ulcerative process. High Risk         Hypertension, hyperlipidemia, Diabetes, past history of Factors:          smoking.  Comparison Study: No prior studies. Performing Technologist: Darlin Coco RDMS RVT  Examination Guidelines: A complete evaluation includes at minimum, Doppler waveform signals and systolic blood pressure reading at the level of bilateral  brachial, anterior tibial, and posterior tibial arteries, when vessel segments are accessible. Bilateral testing is considered an integral part of a complete examination. Photoelectric Plethysmograph (PPG) waveforms and toe systolic pressure readings are included as required and additional duplex testing as needed. Limited examinations for reoccurring indications may be performed as noted.  ABI Findings: +---------+------------------+-----+---------+--------+ Right    Rt Pressure (mmHg)IndexWaveform Comment  +---------+------------------+-----+---------+--------+ Brachial 130                    triphasic         +---------+------------------+-----+---------+--------+ PTA      134               1.02 triphasic         +---------+------------------+-----+---------+--------+ DP       127               0.97 triphasic         +---------+------------------+-----+---------+--------+ Great Toe110               0.84 Normal            +---------+------------------+-----+---------+--------+ +---------+------------------+-----+-----------+-------+ Left     Lt Pressure (mmHg)IndexWaveform   Comment +---------+------------------+-----+-----------+-------+ Brachial 131                    triphasic          +---------+------------------+-----+-----------+-------+ PTA      126               0.96 triphasic          +---------+------------------+-----+-----------+-------+ DP       120               0.92 multiphasic        +---------+------------------+-----+-----------+-------+ Great Toe87                0.66 Abnormal           +---------+------------------+-----+-----------+-------+ +-------+-----------+-----------+------------+------------+ ABI/TBIToday's ABIToday's TBIPrevious ABIPrevious TBI +-------+-----------+-----------+------------+------------+ Right  1.02       0.84                                 +-------+-----------+-----------+------------+------------+ Left   0.96       0.66                                +-------+-----------+-----------+------------+------------+  Summary: Right: Resting right ankle-brachial index is within normal range. The right toe-brachial index is normal. Left: Resting left ankle-brachial index is within normal range. The left toe-brachial index is mildly abnormal. *See table(s) above for measurements and observations.  Electronically signed by Orlie Pollen on 03/13/2022 at 8:08:44 PM.    Final    CT EXTREMITY LOWER LEFT W CONTRAST  Result Date: 03/13/2022 CLINICAL DATA:  66 year old diabetic female with left foot cellulitis. Increased redness pain and swelling. Rule out necrotizing fasciitis. EXAM: CT OF THE LOWER LEFT EXTREMITY WITH CONTRAST TECHNIQUE: Multidetector CT imaging of the lower left extremity was performed according to the standard protocol following intravenous contrast administration. RADIATION DOSE REDUCTION: This exam was performed according to the departmental dose-optimization program which includes automated exposure control, adjustment of the mA and/or kV according to patient size and/or use of iterative reconstruction technique. CONTRAST:  52m OMNIPAQUE IOHEXOL 350 MG/ML SOLN COMPARISON:  Left foot radiographs 03/12/2022. FINDINGS: Calcified peripheral vascular disease. Major vascular structures in the visible left lower extremity appear to be enhancing and patent. Above the left knee soft tissues appear negative except for muscle atrophy. Beginning at the left knee joint there is posterolateral subcutaneous swelling and stranding which become circumferential in the tib fib and continues distally. Along the left lateral calcaneus a simple fluid density superficial soft tissue swelling of up to 3.5 x 1.5 cm (series 4, image 290) is demonstrated, and this abnormal dermal appearing edema tracks laterally and posteriorly along the posterior foot as  seen on series 9, image 93. Underlying subcutaneous stranding in the visible foot. But no soft tissue gas in the lower extremity. And no other fluid collection. Distal femur is intact. Patella is intact. No knee joint effusion. Tibial plateau and proximal fibula are intact. Tibia and fibula shafts appear intact. Distal tibia and fibula appear intact. Normal mortise joint alignment. Talus is intact. No ankle joint effusion. Calcaneus appears intact. No fracture or cortical osteolysis identified. IMPRESSION: 1. Posterior left foot Dermal fluid collection tracking around the posterolateral calcaneus (series 4, image 290). Underlying subcutaneous and generalized tib-fib Cellulitis. But no soft tissue gas or CT evidence of osteomyelitis. And no subcutaneous or deep fluid collection. 2. Calcified peripheral vascular disease. Electronically Signed   By: HGenevie AnnM.D.   On: 03/13/2022 05:59   DG Foot Complete Left  Result Date: 03/12/2022 CLINICAL DATA:  Left foot cellulitis.  Diabetic. EXAM: LEFT FOOT - COMPLETE 3+ VIEW COMPARISON:  None Available. FINDINGS: Moderate diffuse soft tissue edema. No signs of acute fracture or dislocation. Plantar and posterior calcaneal heel spurs noted. No bone erosions noted. IMPRESSION: 1. Moderate diffuse soft tissue edema. 2. No acute bone abnormality. Electronically Signed   By: TKerby MoorsM.D.   On: 03/12/2022 13:25     Discharge Exam: Vitals:   03/16/22 0353 03/16/22 0900  BP: (!) 118/54 (!) 150/83  Pulse: 78 93  Resp: 18 15  Temp: 97.8 F (36.6 C) 98.5 F (36.9 C)  SpO2: 96% 96%   Vitals:   03/15/22 1657 03/15/22 1932 03/16/22 0353 03/16/22 0900  BP: (!) 151/78 (!) 129/94 (!) 118/54 (!) 150/83  Pulse: 81 87 78 93  Resp: _0 Temp: 98.4 F (36.9 C) 98 F (36.7 C) 97.8 F (36.6 C) 98.5 F (36.9 C)  TempSrc: Oral   Oral  SpO2: 96% 94% 96% 96%  Weight:      Height:        General: Pt is alert,  awake, not in acute distress Cardiovascular:  RRR, S1/S2 +, no rubs, no gallops Respiratory: CTA bilaterally, no wheezing, no rhonchi Abdominal: Soft, NT, ND, bowel sounds + Extremities: no edema, no cyanosis, dressing in the left foot.    The results of significant diagnostics from this hospitalization (including imaging, microbiology, ancillary and laboratory) are listed below for reference.     Microbiology: Recent Results (from the past 240 hour(s))  Aerobic Culture w Gram Stain (superficial specimen)     Status: None (Preliminary result)   Collection Time: 03/13/22  5:29 PM   Specimen: Wound  Result Value Ref Range Status   Specimen Description WOUND  Final   Special Requests LEFT FOOT  Final   Gram Stain   Final    RARE WBC PRESENT, PREDOMINANTLY MONONUCLEAR FEW GRAM POSITIVE COCCI IN CLUSTERS    Culture   Final    ABUNDANT STAPHYLOCOCCUS AUREUS SUSCEPTIBILITIES TO FOLLOW Performed at [facility], [place] [address], [place], [place] [address]    Report Status PENDING  Incomplete  MRSA Next Gen by PCR, Nasal     Status: None   Collection Time: 03/13/22  9:05 PM  Result Value Ref Range Status   MRSA by PCR Next Gen NOT DETECTED NOT DETECTED Final    Comment: (NOTE) The GeneXpert MRSA Assay (FDA approved for NASAL specimens only), is one component of a comprehensive MRSA colonization surveillance program. It is not intended to diagnose MRSA infection nor to guide or monitor treatment for MRSA infections. Test performance is not FDA approved in patients less than 39 years old. Performed at [facility], [place] [address], [place], [place] [address]      Labs: BNP (last 3 results) No results for input(s): "BNP" in the last 8760 hours. Basic Metabolic Panel: Recent Labs  Lab 03/12/22 1309 03/13/22 0647 03/14/22 0506 03/16/22 0438 03/16/22 0823  NA 135 131* 136 140 134*  K 5.3* 4.4 4.4 5.4* 4.7  CL 96* 96* 101 101 97*  CO2 28 20* _0 GLUCOSE 217* 250* 191* 211* 320*  BUN _1 28* 28*   CREATININE 1.17* 1.26* 1.20* 1.22* 1.21*  CALCIUM 9.5 8.6* 9.2 9.8 9.1   Liver Function Tests: Recent Labs  Lab 03/12/22 1309 03/14/22 0506  AST 58* 62*  ALT 54* 43  ALKPHOS 64 61  BILITOT 0.8 0.7  PROT 6.5 6.1*  ALBUMIN 3.5 3.0*   No results for input(s): "LIPASE", "AMYLASE" in the last 168 hours. No results for input(s): "AMMONIA" in the last 168 hours. CBC: Recent Labs  Lab 03/12/22 1309 03/13/22 0647 03/14/22 0506 03/16/22 0438  WBC 9.1 8.7 7.6 6.6  NEUTROABS 6.1  --  4.2 3.3  HGB 13.6 12.9 13.6 13.8  HCT 40.0 39.2 39.6 41.8  MCV 103.9* 107.1* 104.2* 105.6*  PLT 212 193 223 254   Cardiac Enzymes: No results for input(s): "CKTOTAL", "CKMB", "CKMBINDEX", "TROPONINI" in the last 168 hours. BNP: Invalid input(s): "POCBNP" CBG: Recent Labs  Lab 03/15/22 0800 03/15/22 1147 03/15/22 1705 03/15/22 1935 03/16/22 0905  GLUCAP 232* 249* 232* 226* 294*   D-Dimer No results for input(s): "DDIMER" in the last 72 hours. Hgb A1c Recent Labs    03/13/22 1013  HGBA1C 8.0*   Lipid Profile No results for input(s): "CHOL", "HDL", "LDLCALC", "TRIG", "CHOLHDL", "LDLDIRECT" in the last 72 hours. Thyroid function studies No results for input(s): "TSH", "T4TOTAL", "T3FREE", "THYROIDAB" in the last 72 hours.  Invalid input(s): "FREET3" Anemia work  up No results for input(s): "VITAMINB12", "FOLATE", "FERRITIN", "TIBC", "IRON", "RETICCTPCT" in the last 72 hours. Urinalysis    Component Value Date/Time   COLORURINE AMBER (A) 04/24/2011 1935   APPEARANCEUR CLOUDY (A) 04/24/2011 1935   LABSPEC 1.012 04/24/2011 1935   PHURINE 5.5 04/24/2011 1935   GLUCOSEU NEGATIVE 04/24/2011 1935   HGBUR NEGATIVE 04/24/2011 1935   BILIRUBINUR MODERATE (A) 04/24/2011 1935   KETONESUR 15 (A) 04/24/2011 1935   PROTEINUR NEGATIVE 04/24/2011 1935   UROBILINOGEN 2.0 (H) 04/24/2011 1935   NITRITE NEGATIVE 04/24/2011 1935   LEUKOCYTESUR NEGATIVE 04/24/2011 1935   Sepsis Labs Recent Labs   Lab 03/12/22 1309 03/13/22 0647 03/14/22 0506 03/16/22 0438  WBC 9.1 8.7 7.6 6.6   Microbiology Recent Results (from the past 240 hour(s))  Aerobic Culture w Gram Stain (superficial specimen)     Status: None (Preliminary result)   Collection Time: 03/13/22  5:29 PM   Specimen: Wound  Result Value Ref Range Status   Specimen Description WOUND  Final   Special Requests LEFT FOOT  Final   Gram Stain   Final    RARE WBC PRESENT, PREDOMINANTLY MONONUCLEAR FEW GRAM POSITIVE COCCI IN CLUSTERS    Culture   Final    ABUNDANT STAPHYLOCOCCUS AUREUS SUSCEPTIBILITIES TO FOLLOW Performed at [facility], [place] [address], [place], [place] [address]    Report Status PENDING  Incomplete  MRSA Next Gen by PCR, Nasal     Status: None   Collection Time: 03/13/22  9:05 PM  Result Value Ref Range Status   MRSA by PCR Next Gen NOT DETECTED NOT DETECTED Final    Comment: (NOTE) The GeneXpert MRSA Assay (FDA approved for NASAL specimens only), is one component of a comprehensive MRSA colonization surveillance program. It is not intended to diagnose MRSA infection nor to guide or monitor treatment for MRSA infections. Test performance is not FDA approved in patients less than 47 years old. Performed at [facility], [place] [address] [place], [place] [address]      Time coordinating discharge: Over 30 minutes  SIGNED:   Darliss Cheney, MD  [facility] 03/16/2022, 9:52 AM *Please note that this is a verbal dictation therefore any spelling or grammatical errors are due to the "[place] One" system interpretation. If 7PM-7AM, please contact night-coverage [email or web address]

## 2022-03-16 NOTE — Progress Notes (Signed)
PODIATRY PROGRESS NOTE Patient Name: Traci Harper  DOB Oct 14, 1955 DOA 03/12/2022  Hospital Day: 5  Assessment:  66 y.o. female with PMHx significant for liver transplant 2014, DM2, HTN, PEs on Eliquis  with cellulitis of the LLE 2/2 infected friction blister/bullae posterior heel. Now s/p bedside I&D of the bullae. Progressing with IV abx and wound care. Cellulitis is resolved in the LLE and wound improving.     AF, VSS  WBC: 7.6 ESR/CRP: 18/10.9  Wound/Bone Cultures: Wound cx with Staph aureus, pending  Imaging:  CT LLE W contrast 10/12 Posterior left foot Dermal fluid collection tracking around the posterolateral calcaneus (series 4, image 290). Underlying subcutaneous and generalized tib-fib Cellulitis. But no soft tissue gas or CT evidence of osteomyelitis. And no subcutaneous or deep fluid collection. Plan:  - Continue local wound care.  QOD dressing  changes with dry gauze dressing applied with nonadherent contact layer, currently xeroform vs betadine adaptic.  - Provided patient with some dressing supplies for home. Want the dressing changed every other day to every third day. - Recommend course of 10 days doxycyline for home. Culture with Staph, pending susceptibility.  - WBAT to the LLE in post op shoe, ordered.  - Stable for discharge from my standpoint, Follow up in clinic later this week for wound check, pt to call to make apt tomorrow.         Everitt Amber, DPM [facility]    Subjective:  Patient seen bedside doing well. Aware of improvement in the wound. Aware of wound care and had a video taken to show how to do dressing changes at home. Aware of follow up instructions and return precautions / criteria. All questions answered agrees with plan.   Objective:   Vitals:   03/16/22 0353 03/16/22 0900  BP: (!) 118/54 (!) 150/83  Pulse: 78 93  Resp: 18 15  Temp: 97.8 F (36.6 C) 98.5 F (36.9 C)  SpO2: 96% 96%       Latest Ref Rng &  Units 03/16/2022    4:38 AM 03/14/2022    5:06 AM 03/13/2022    6:47 AM  CBC  WBC 4.0 - 10.5 K/uL 6.6  7.6  8.7   Hemoglobin 12.0 - 15.0 g/dL 13.8  13.6  12.9   Hematocrit 36.0 - 46.0 % 41.8  39.6  39.2   Platelets 150 - 400 K/uL 254  223  193        Latest Ref Rng & Units 03/16/2022    8:23 AM 03/16/2022    4:38 AM 03/14/2022    5:06 AM  BMP  Glucose 70 - 99 mg/dL 320  211  191   BUN 8 - 23 mg/dL 28  28  14    Creatinine 0.44 - 1.00 mg/dL 1.21  1.22  1.20   Sodium 135 - 145 mmol/L 134  140  136   Potassium 3.5 - 5.1 mmol/L 4.7  5.4  4.4   Chloride 98 - 111 mmol/L 97  101  101   CO2 22 - 32 mmol/L 26  26  26    Calcium 8.9 - 10.3 mg/dL 9.1  9.8  9.2     General: AAOx3, NAD  Lower Extremity Exam Vasc:   L - PT palpable, DP palpable. Cap refill <3 sec to digits  Derm: : Left heel ulcer much improved from prior, appears to have granular tissue ingrowing. Erythema nearly completely gone.     MSK:  Tender  to palpation left heel  Neuro:    L - Gross sensation intact. Gross motor function intact   Radiology:  Results reviewed. See assessment for pertinent imaging results

## 2022-03-17 ENCOUNTER — Telehealth: Payer: Self-pay

## 2022-03-17 NOTE — Telephone Encounter (Signed)
Transition Care Management Follow-up Telephone Call Date of discharge and from where: 03/16/22 [place]  How have you been since you were released from the hospital? Doing well Any questions or concerns? No  Items Reviewed: Did the pt receive and understand the discharge instructions provided? Yes  Medications obtained and verified? Yes  Other? No  Any new allergies since your discharge? No  Dietary orders reviewed? Yes Do you have support at home? Yes   Home Care and Equipment/Supplies: Were home health services ordered? yes If so, what is the name of the agency?  [facility] Has the agency set up a time to come to the patient's home? not yet Were any new equipment or medical supplies ordered?  No What is the name of the medical supply agency? N/a Were you able to get the supplies/equipment? not applicable Do you have any questions related to the use of the equipment or supplies? No  Functional Questionnaire: (I = Independent and D = Dependent) ADLs: I  Bathing/Dressing- I  Meal Prep- I  Eating- I  Maintaining continence- I  Transferring/Ambulation- I USES A CANE AS NEEDED   Managing Meds- I  Follow up appointments reviewed:  PCP Hospital f/u appt confirmed? Yes  Scheduled to see Dr Jerline Pain 03/18/22 @ 11:20. [facility] f/u appt confirmed? No  Scheduled Are transportation arrangements needed? No  If their condition worsens, is the pt aware to call PCP or go to the Emergency Dept.? Yes Was the patient provided with contact information for the PCP's office or ED? Yes Was to pt encouraged to call back with questions or concerns? Yes    Charlott Rakes, LPN [facility]  Direct Dial [phone]

## 2022-03-18 ENCOUNTER — Ambulatory Visit (INDEPENDENT_AMBULATORY_CARE_PROVIDER_SITE_OTHER): Payer: 59 | Admitting: Family Medicine

## 2022-03-18 ENCOUNTER — Encounter: Payer: Self-pay | Admitting: Family Medicine

## 2022-03-18 VITALS — BP 120/76 | HR 71 | Temp 98.6°F | Ht 63.0 in | Wt 244.0 lb

## 2022-03-18 DIAGNOSIS — I152 Hypertension secondary to endocrine disorders: Secondary | ICD-10-CM

## 2022-03-18 DIAGNOSIS — E1165 Type 2 diabetes mellitus with hyperglycemia: Secondary | ICD-10-CM

## 2022-03-18 DIAGNOSIS — E1159 Type 2 diabetes mellitus with other circulatory complications: Secondary | ICD-10-CM

## 2022-03-18 LAB — BASIC METABOLIC PANEL
BUN: 17 mg/dL (ref 6–23)
CO2: 30 mEq/L (ref 19–32)
Calcium: 9.9 mg/dL (ref 8.4–10.5)
Chloride: 96 mEq/L (ref 96–112)
Creatinine, Ser: 1.1 mg/dL (ref 0.40–1.20)
GFR: 53.27 mL/min — ABNORMAL LOW (ref >60.00)
Glucose, Bld: 214 mg/dL — ABNORMAL HIGH (ref 70–99)
Potassium: 5.2 mEq/L — ABNORMAL HIGH (ref 3.5–5.1)
Sodium: 134 mEq/L — ABNORMAL LOW (ref 135–145)

## 2022-03-18 LAB — TSH: TSH: 25.78 u[IU]/mL — ABNORMAL HIGH (ref 0.35–5.50)

## 2022-03-18 MED ORDER — SULFAMETHOXAZOLE-TRIMETHOPRIM 800-160 MG PO TABS: 1.0000 | ORAL_TABLET | Freq: Two times a day (BID) | ORAL | 0 refills | Status: DC

## 2022-03-18 NOTE — Patient Instructions (Signed)
It was very nice to see you today!  Please Stop the doxycycline and start the Bactrim.  Please keep your area clean and dry.  We will recheck blood work today.  I will see back in about 3 months to recheck your A1c.  Come back sooner if needed.   Take care, Dr Jerline Pain  PLEASE NOTE:  If you had any lab tests please let us know if you have not heard back within a few days. You may see your results on mychart before we have a chance to review them but we will give you a call once they are reviewed by [place]. If we ordered any referrals today, please let us know if you have not heard from their office within the next week.   Please try these tips to maintain a healthy lifestyle:  Eat at least 3 REAL meals and 1-2 snacks per day.  Aim for no more than 5 hours between eating.  If you eat breakfast, please do so within one hour of getting up.   Each meal should contain half fruits/vegetables, one quarter protein, and one quarter carbs (no bigger than a computer mouse)  Cut down on sweet beverages. This includes juice, soda, and sweet tea.   Drink at least 1 glass of water with each meal and aim for at least 8 glasses per day  Exercise at least 150 minutes every week.

## 2022-03-18 NOTE — Assessment & Plan Note (Signed)
At goal today.  Continue current regimen Benicar 5 mg daily and metoprolol tartrate 50 mg twice daily.

## 2022-03-18 NOTE — Progress Notes (Signed)
Chief Complaint:  Traci Harper is a 66 y.o. female who presents today for a TCM visit.  Assessment/Plan:  New/Acute Problems: MRSA Cellulitis  Area seems to be healing.  Cultures from hospital showed resistance to doxycycline.  We will switch her to Bactrim.  She has done well with sulfa antibiotics in the past.  She will follow-up with podiatry in a couple of days.  Hyperkalemia Likely spurious result while in the hospital.  Levels had normalized at the time of discharge.  We will recheck labs today.  Chronic Problems Addressed Today: Type 2 diabetes mellitus with hyperglycemia (HCC) A1c 8.0 in the hospital.  Likely elevated due to infection and stress.  She has been well controlled in the past.  We will continue current regimen metformin 1000 mg twice daily Trulicity 1.5 mg weekly.  She will come back in 3 months and we can recheck A1c at that point.  Hypertension associated with diabetes ([place]) At goal today.  Continue current regimen Benicar 5 mg daily and metoprolol tartrate 50 mg twice daily.     Subjective:  HPI:  Summary of Hospital admission: Reason for admission: Cellulitis Date of admission: 03/12/2022 Date of discharge: 03/16/2022 Date of Interactive contact: 03/17/2022 Summary of Hospital course: Patient presented to the ED on 03/12/2022 with foot pain and swelling.  Just prior to this she was started on doxycycline for presumed cellulitis however symptoms did not improve and she developed worsening erythema and swelling.  She was admitted for cellulitis.  Podiatry was consulted.  She underwent incision and drainage while admitted.  She was discharged home to complete a 10-day course of oral doxycycline and will be following up with podiatry.  Vascular surgery was also consulted during admission and had ABIs performed which showed sufficient blood flow.  Interim history:  She has done well since being home the last couple days.  Pain seems to be controlled.  No  fevers or chills.  They have not changed the dressing since being home.  They have follow-up with podiatry in a couple of days.  ROS: Per HPI, otherwise a complete review of systems was negative.   PMH:  The following were reviewed and entered/updated in epic: Past Medical History:  Diagnosis Date   Diabetes mellitus    Hepatic encephalopathy ([place])    Hypertension    Hypothyroidism    Kidney insufficiency    Molar pregnancy    at age 81   Thyroid disease    Patient Active Problem List   Diagnosis Date Noted   Stress 10/01/2021   Dyslipidemia associated with type 2 diabetes mellitus ([place]) 10/01/2021   Pulmonary embolism ([place]) 04/02/2021   Hypertension associated with diabetes ([place]) 04/23/2020   Type 2 diabetes mellitus with hyperglycemia ([place]) 04/23/2020   Hypothyroidism 04/23/2020   GERD (gastroesophageal reflux disease) 04/23/2020   Liver [facility] 2014 [facility]) follows with [facility] 04/01/2013   Past Surgical History:  Procedure Laterality Date   APPENDECTOMY     BACK SURGERY     BREAST BIOPSY Right    CESAREAN SECTION     x2   CHOLECYSTECTOMY     DILATION AND CURETTAGE OF UTERUS     at [place]  09/17/2012   @[facility]  [employer]   THYROID SURGERY     x2   TUBAL LIGATION     V-TACH ABLATION      Family History  Problem  Relation Age of Onset   Diabetes Mother    Heart disease Father    Diabetes Father     Medications- Reconciled discharge and current medications in Epic.  Current Outpatient Medications  Medication Sig Dispense Refill   Ascorbic Acid (VITAMIN C) 100 MG tablet Take 100 mg by mouth daily.     Blood Glucose Monitoring Suppl (ONETOUCH VERIO FLEX SYSTEM) w/Device KIT USE   TO CHECK GLUCOSE FOR BLOOD SUGAR 1 kit 3   calcium citrate-vitamin D (CITRACAL+D) 315-200 MG-UNIT tablet Take 1 tablet by mouth daily.     Dulaglutide (TRULICITY) 1.5 VW/0.9WJ SOPN Inject 1.5 mg into the skin once a week.  (Patient taking differently: Inject 1.5 mg into the skin once a week. Thursday) 6 mL 3   ELIQUIS 5 MG TABS tablet Take 1 tablet (5 mg total) by mouth 2 (two) times daily. 60 tablet 5   glucose blood test strip 1 each by Other route as needed for other. Use as instructed 100 each 1   levothyroxine (SYNTHROID) 150 MCG tablet Take 1 tablet (150 mcg total) by mouth daily. 90 tablet 3   magnesium oxide (MAG-OX) 400 MG tablet Take 1 tablet (400 mg total) by mouth 2 (two) times daily. (Patient taking differently: Take 800 mg by mouth at bedtime.) 180 tablet 3   metFORMIN (GLUCOPHAGE) 1000 MG tablet Take 1 tablet (1,000 mg total) by mouth 2 (two) times daily with a meal. 180 tablet 3   metoprolol tartrate (LOPRESSOR) 50 MG tablet TAKE 1 TABLET BY MOUTH TWICE A DAY (Patient taking differently: Take 50 mg by mouth 2 (two) times daily.) 180 tablet 1   Multiple Vitamin (ONE DAILY) tablet Take 1 tablet by mouth daily.      mycophenolate (MYFORTIC) 360 MG TBEC Take 360 mg by mouth 2 (two) times daily.      olmesartan (BENICAR) 5 MG tablet Take 1 tablet (5 mg total) by mouth daily. 90 tablet 1   omeprazole (PRILOSEC) 20 MG capsule Take 1 capsule (20 mg total) by mouth daily. 90 capsule 3   oxyCODONE (OXY IR/ROXICODONE) 5 MG immediate release tablet Take 1 tablet (5 mg total) by mouth every 4 (four) hours as needed for severe pain. 10 tablet 0   PROGRAF 0.5 MG capsule Take 0.5 mg by mouth 2 (two) times daily.  10   sulfamethoxazole-trimethoprim (BACTRIM DS) 800-160 MG tablet Take 1 tablet by mouth 2 (two) times daily. 20 tablet 0   No current facility-administered medications for this visit.    Allergies-reviewed and updated Allergies  Allergen Reactions   Lisinopril Cough    Social History   Socioeconomic History   Marital status: Married    Spouse name: Not on file   Number of children: Not on file   Years of education: Not on file   Highest education level: Not on file  Occupational History   Not  on file  Tobacco Use   Smoking status: Former    Types: Cigarettes    Quit date: 06/02/2002    Years since quitting: 19.8   Smokeless tobacco: Not on file  Substance and Sexual Activity   Alcohol use: No    Comment: No alcohol since 2012   Drug use: No   Sexual activity: Never  Other Topics Concern   Not on file  Social History Narrative   Not on file   Social Determinants of Health   Financial Resource Strain: Not on file  Food Insecurity: No Food Insecurity (03/14/2022)  Hunger Vital Sign    Worried About Running Out of Food in the Last Year: Never true    Ran Out of Food in the Last Year: Never true  Transportation Needs: No Transportation Needs (03/14/2022)   PRAPARE - Hydrologist (Medical): No    Lack of Transportation (Non-Medical): No  Physical Activity: Not on file  Stress: Not on file  Social Connections: Not on file        Objective:  Physical Exam: BP 120/76   Pulse 71   Temp 98.6 F (37 C) (Temporal)   Ht 5' 3"  (1.6 m)   Wt 244 lb (110.7 kg)   SpO2 96%   BMI 43.22 kg/m   Gen: NAD, resting comfortably CV: RRR with no murmurs appreciated Pulm: NWOB, CTAB with no crackles, wheezes, or rhonchi GI: Normal bowel sounds present. Soft, Nontender, Nondistended. MSK: No edema, cyanosis, or clubbing noted Skin: Warm, dry.  Approximately 5 cm open ulcer on left posterior heel.  Granulation tissue present.  Mild amount of surrounding erythema. Neuro: Grossly normal, moves all extremities Psych: Normal affect and thought content   Time Spent: 45 minutes of total time was spent on the date of the encounter performing the following actions: chart review prior to seeing the patient, obtaining history, performing a medically necessary exam, counseling on the treatment plan, placing orders, and documenting in our EHR.       Algis Greenhouse. Jerline Pain, MD 03/18/2022 12:31 PM

## 2022-03-18 NOTE — Assessment & Plan Note (Signed)
A1c 8.0 in the hospital.  Likely elevated due to infection and stress.  She has been well controlled in the past.  We will continue current regimen metformin 1000 mg twice daily Trulicity 1.5 mg weekly.  She will come back in 3 months and we can recheck A1c at that point.

## 2022-03-19 ENCOUNTER — Telehealth: Payer: Self-pay | Admitting: Family Medicine

## 2022-03-19 NOTE — Telephone Encounter (Signed)
Blood work done at [facility] -  25.78 thyroid .    Please call patient for results.

## 2022-03-20 ENCOUNTER — Ambulatory Visit (INDEPENDENT_AMBULATORY_CARE_PROVIDER_SITE_OTHER): Payer: 59 | Admitting: Podiatry

## 2022-03-20 ENCOUNTER — Other Ambulatory Visit: Payer: Self-pay | Admitting: *Deleted

## 2022-03-20 DIAGNOSIS — E039 Hypothyroidism, unspecified: Secondary | ICD-10-CM

## 2022-03-20 DIAGNOSIS — E1142 Type 2 diabetes mellitus with diabetic polyneuropathy: Secondary | ICD-10-CM | POA: Diagnosis not present

## 2022-03-20 DIAGNOSIS — L97422 Non-pressure chronic ulcer of left heel and midfoot with fat layer exposed: Secondary | ICD-10-CM | POA: Diagnosis not present

## 2022-03-20 NOTE — Telephone Encounter (Signed)
See results note. 

## 2022-03-20 NOTE — Progress Notes (Signed)
Please inform patient of the following:  Potassium levels are stable.  Her thyroid level is off significantly however this can sometimes be seen after a bout of illness or hospitalization.  I would like for her to come back in a few weeks to recheck TSH and BMET before we make any medication changes.  Please place future order.

## 2022-03-20 NOTE — Progress Notes (Signed)
Subjective:  Patient ID: Traci Harper, female    DOB: [birth year],  MRN: [id number]  Chief Complaint  Patient presents with   Wound Check    Patient is here for wound check on left foot (heel) she states that she is healing well.Patient states that she has dry patch on right foot (top) that she woud like for the provider to take a look at.    66 y.o. female presents for follow-up of left heel ulceration.  She states that the wound has been doing well since she was discharged from the hospital on Sunday.  She has been keeping the dressing clean dry and intact as instructed.  It was changing her primary care doctor who she saw on Tuesday.  She said that she was switched from doxycycline to Bactrim related to the wound cultures growing out MRSA.  She denies any nausea vomiting fever chills.  Denies any redness in the left foot or leg.  She also has an area of red scaly skin on the right foot but no open wound.  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 89   Thyroid disease     Allergies  Allergen Reactions   Lisinopril Cough    ROS: Negative except as per HPI above  Objective:  General: AAO x3, NAD  Dermatological: Right foot small circular patch of red scaly rash consistent with likely tinea pedis infection.  Versus dermatitis.  Left heel ulceration progressing very well with increased healing from the weekend.  Decreasing depth of the wound borders with some hyperkeratotic tissue but overall improvement.  No erythema surrounding.  No fluctuance or drainage.  Healthy granular tissue present over most of the wound base.  Vascular:  Dorsalis Pedis artery and Posterior Tibial artery pedal pulses are 2/4 bilateral.  Capillary fill time < 3 sec to all digits.   Neruologic: Grossly intact via light touch bilateral. Protective threshold intact to all  sites bilateral.   Musculoskeletal: No gross boney pedal deformities bilateral. No pain, crepitus, or limitation noted with foot and ankle range of motion bilateral. Muscular strength 5/5 in all groups tested bilateral.  Gait: Unassisted, Nonantalgic.       Assessment:   1. 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 DM type 2 with diabetic peripheral neuropathy (HCC)      Plan:  Patient was evaluated and treated and all questions answered.  #Ulcer of left heel with fat layer exposed, improving from prior no evidence of infection at this time.  -Discussed with the patient want her to continue every other to every third day dressing changes.  This will be with Xeroform 4 x 4 Kerlix Ace roll. -Okay to be weightbearing as tolerated on the left foot in postop shoe -Continue to take Bactrim until the course is completed. -Continue to monitor for any redness swelling or drainage in the left foot or leg. -Keep the area dry. -Continue to offload the left heel when sleeping or laying down so that there is no pressure on the posterior aspect of the heel I floating off pillows. -Patient will follow-up in 1 week for further wound check.  Return in about 1 week (around 03/27/2022) for Wound check left heel.          Cato Mulligan Brodrick Curran,  DPM [place] [facility]

## 2022-03-21 ENCOUNTER — Other Ambulatory Visit: Payer: Self-pay | Admitting: Family Medicine

## 2022-03-25 ENCOUNTER — Other Ambulatory Visit: Payer: Self-pay | Admitting: Family Medicine

## 2022-03-26 ENCOUNTER — Encounter: Payer: Self-pay | Admitting: Family Medicine

## 2022-03-27 NOTE — Telephone Encounter (Signed)
This was sent in 6 days ago per the [facility]. Has she checked with her pharmacy?

## 2022-03-28 ENCOUNTER — Other Ambulatory Visit: Payer: Self-pay | Admitting: Family Medicine

## 2022-04-01 ENCOUNTER — Other Ambulatory Visit: Payer: 59

## 2022-04-02 ENCOUNTER — Other Ambulatory Visit (INDEPENDENT_AMBULATORY_CARE_PROVIDER_SITE_OTHER): Payer: 59

## 2022-04-02 ENCOUNTER — Ambulatory Visit (INDEPENDENT_AMBULATORY_CARE_PROVIDER_SITE_OTHER): Payer: 59 | Admitting: Family

## 2022-04-02 ENCOUNTER — Encounter: Payer: Self-pay | Admitting: Family

## 2022-04-02 VITALS — BP 136/86 | HR 80 | Temp 98.5°F | Ht 63.0 in | Wt 240.0 lb

## 2022-04-02 DIAGNOSIS — R053 Chronic cough: Secondary | ICD-10-CM

## 2022-04-02 DIAGNOSIS — R0982 Postnasal drip: Secondary | ICD-10-CM

## 2022-04-02 DIAGNOSIS — E039 Hypothyroidism, unspecified: Secondary | ICD-10-CM | POA: Diagnosis not present

## 2022-04-02 LAB — COMPREHENSIVE METABOLIC PANEL
ALT: 66 U/L — ABNORMAL HIGH (ref 0–35)
AST: 62 U/L — ABNORMAL HIGH (ref 0–37)
Albumin: 4.2 g/dL (ref 3.5–5.2)
Alkaline Phosphatase: 52 U/L (ref 39–117)
BUN: 26 mg/dL — ABNORMAL HIGH (ref 6–23)
CO2: 28 mEq/L (ref 19–32)
Calcium: 10.3 mg/dL (ref 8.4–10.5)
Chloride: 100 mEq/L (ref 96–112)
Creatinine, Ser: 1.19 mg/dL (ref 0.40–1.20)
GFR: 48.46 mL/min — ABNORMAL LOW (ref >60.00)
Glucose, Bld: 172 mg/dL — ABNORMAL HIGH (ref 70–99)
Potassium: 5.1 mEq/L (ref 3.5–5.1)
Sodium: 141 mEq/L (ref 135–145)
Total Bilirubin: 0.5 mg/dL (ref 0.2–1.2)
Total Protein: 6.5 g/dL (ref 6.0–8.3)

## 2022-04-02 LAB — BASIC METABOLIC PANEL
BUN: 26 mg/dL — ABNORMAL HIGH (ref 6–23)
CO2: 28 mEq/L (ref 19–32)
Calcium: 10.3 mg/dL (ref 8.4–10.5)
Chloride: 100 mEq/L (ref 96–112)
Creatinine, Ser: 1.19 mg/dL (ref 0.40–1.20)
GFR: 48.46 mL/min — ABNORMAL LOW (ref >60.00)
Glucose, Bld: 172 mg/dL — ABNORMAL HIGH (ref 70–99)
Potassium: 5.1 mEq/L (ref 3.5–5.1)
Sodium: 141 mEq/L (ref 135–145)

## 2022-04-02 LAB — TSH: TSH: 14.84 u[IU]/mL — ABNORMAL HIGH (ref 0.35–5.50)

## 2022-04-02 MED ORDER — LEVOCETIRIZINE DIHYDROCHLORIDE 5 MG PO TABS: 5.0000 mg | ORAL_TABLET | Freq: Every evening | ORAL | 0 refills | Status: AC

## 2022-04-02 MED ORDER — BENZONATATE 200 MG PO CAPS: 200.0000 mg | ORAL_CAPSULE | Freq: Three times a day (TID) | ORAL | 0 refills | Status: AC | PRN

## 2022-04-02 MED ORDER — METHYLPREDNISOLONE ACETATE 80 MG/ML IJ SUSP
80.0000 mg | Freq: Once | INTRAMUSCULAR | Status: AC
  Administered 2022-04-02: 80 mg via INTRAMUSCULAR

## 2022-04-02 NOTE — Progress Notes (Signed)
Patient ID: Traci Harper, female    DOB: 28-Sep-1955, 66 y.o.   MRN: [id number]  Chief Complaint  Patient presents with   Cough    Pt c/o Cough with green mucus and congestion for about a week. Symptoms started a week ago. Has tried mucinex which does not help. Covid negative on Monday. Flu shot beginning of oct.     HPI:      Cough:   denies sinus sx and fever. Pt c/o Cough with green mucus and congestion for about a week. Symptoms started a week ago. Has tried mucinex which does not help. Covid negative on Monday. Flu shot beginning of oct.   Assessment & Plan:  1. Persistent cough recently in hospital for foot infection on several abt. Giving steroid shot in office, sending Tessalon pearles, can stop Mucinex, also sending Xyzal which will help exudate in pharnyx contributing to cough. Drink more water, 2L per day.  - benzonatate (TESSALON) 200 MG capsule; Take 1 capsule (200 mg total) by mouth 3 (three) times daily as needed for up to 10 days for cough.  Dispense: 30 capsule; Refill: 0 - methylPREDNISolone acetate (DEPO-MEDROL) injection 80 mg  2. Postnasal drip sending generic Xyzal, advised on use & SE.  Drink lots of water to thin mucus.  - levocetirizine (XYZAL) 5 MG tablet; Take 1 tablet (5 mg total) by mouth every evening.  Dispense: 14 tablet; Refill: 0   Subjective:    Outpatient Medications Prior to Visit  Medication Sig Dispense Refill   Ascorbic Acid (VITAMIN C) 100 MG tablet Take 100 mg by mouth daily.     Blood Glucose Monitoring Suppl (ONETOUCH VERIO FLEX SYSTEM) w/Device KIT USE   TO CHECK GLUCOSE FOR BLOOD SUGAR 1 kit 3   calcium citrate-vitamin D (CITRACAL+D) 315-200 MG-UNIT tablet Take 1 tablet by mouth daily.     Dulaglutide (TRULICITY) 1.5 UY/4.0HK SOPN Inject 1.5 mg into the skin once a week. (Patient taking differently: Inject 1.5 mg into the skin once a week. Thursday) 6 mL 3   ELIQUIS 5 MG TABS tablet Take 1 tablet (5 mg total) by mouth 2 (two) times  daily. 60 tablet 5   glucose blood test strip 1 each by Other route as needed for other. Use as instructed 100 each 1   levothyroxine (SYNTHROID) 150 MCG tablet TAKE 1 TABLET(150 MCG) BY MOUTH DAILY 90 tablet 0   magnesium oxide (MAG-OX) 400 MG tablet Take 1 tablet (400 mg total) by mouth 2 (two) times daily. (Patient taking differently: Take 800 mg by mouth at bedtime.) 180 tablet 3   metFORMIN (GLUCOPHAGE) 1000 MG tablet Take 1 tablet (1,000 mg total) by mouth 2 (two) times daily with a meal. 180 tablet 3   metoprolol tartrate (LOPRESSOR) 50 MG tablet TAKE 1 TABLET BY MOUTH TWICE A DAY (Patient taking differently: Take 50 mg by mouth 2 (two) times daily.) 180 tablet 1   Multiple Vitamin (ONE DAILY) tablet Take 1 tablet by mouth daily.      mycophenolate (MYFORTIC) 360 MG TBEC Take 360 mg by mouth 2 (two) times daily.      olmesartan (BENICAR) 5 MG tablet Take 1 tablet by mouth once daily 90 tablet 0   omeprazole (PRILOSEC) 20 MG capsule Take 1 capsule (20 mg total) by mouth daily. 90 capsule 3   oxyCODONE (OXY IR/ROXICODONE) 5 MG immediate release tablet Take 1 tablet (5 mg total) by mouth every 4 (four) hours as needed for severe pain.  10 tablet 0   PROGRAF 0.5 MG capsule Take 0.5 mg by mouth 2 (two) times daily.  10   sulfamethoxazole-trimethoprim (BACTRIM DS) 800-160 MG tablet Take 1 tablet by mouth 2 (two) times daily. 20 tablet 0   No facility-administered medications prior to visit.   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 34   Thyroid disease    Past Surgical History:  Procedure Laterality Date   APPENDECTOMY     BACK SURGERY     BREAST BIOPSY Right    CESAREAN SECTION     x2   CHOLECYSTECTOMY     DILATION AND CURETTAGE OF UTERUS     at [place]     LIVER TRANSPLANT  09/17/2012   [facility]  [place]   THYROID SURGERY     x2   TUBAL LIGATION     V-TACH ABLATION     Allergies  Allergen Reactions   Lisinopril Cough      Objective:    Physical Exam Vitals and nursing note reviewed.  Constitutional:      Appearance: Normal appearance. She is ill-appearing.     Interventions: Face mask in place.  HENT:     Right Ear: Tympanic membrane and ear canal normal.     Left Ear: Tympanic membrane and ear canal normal.     Nose:     Right Sinus: Frontal sinus tenderness present.     Left Sinus: Frontal sinus tenderness present.     Mouth/Throat:     Mouth: Mucous membranes are moist.     Pharynx: Oropharyngeal exudate and posterior oropharyngeal erythema (mild) present. No pharyngeal swelling or uvula swelling.     Tonsils: No tonsillar exudate or tonsillar abscesses.  Cardiovascular:     Rate and Rhythm: Normal rate and regular rhythm.  Pulmonary:     Effort: Pulmonary effort is normal.     Breath sounds: Normal breath sounds.  Musculoskeletal:        General: Normal range of motion.  Lymphadenopathy:     Head:     Right side of head: No preauricular or posterior auricular adenopathy.     Left side of head: No preauricular or posterior auricular adenopathy.     Cervical: No cervical adenopathy.  Skin:    General: Skin is warm and dry.  Neurological:     Mental Status: She is alert.  Psychiatric:        Mood and Affect: Mood normal.        Behavior: Behavior normal.    BP (!) 142/74 (BP Location: Left Arm, Patient Position: Sitting, Cuff Size: Large)   Pulse 80   Temp 98.5 F (36.9 C) (Temporal)   Ht _1  (1.6 m)   Wt 240 lb (108.9 kg)   SpO2 97%   BMI 42.51 kg/m  Wt Readings from Last 3 Encounters:  04/02/22 240 lb (108.9 kg)  03/18/22 244 lb (110.7 kg)  03/13/22 244 lb 6.4 oz (110.9 kg)       Jeanie Sewer, NP

## 2022-04-02 NOTE — Patient Instructions (Signed)
It was very nice to see you today!   I have sent over cough suppressant capsules, Tessalon Pearles to your pharmacy. I also sent generic Xyzal, an antihistamine, to clear up the postnasal drip in your throat that can be contributing to your cough,  Drink at least 2 Liters of water every day to thin the mucus and help you cough up and spit out.      PLEASE NOTE:  If you had any lab tests please let us know if you have not heard back within a few days. You may see your results on MyChart before we have a chance to review them but we will give you a call once they are reviewed by [place]. If we ordered any referrals today, please let us know if you have not heard from their office within the next week.

## 2022-04-03 ENCOUNTER — Ambulatory Visit (INDEPENDENT_AMBULATORY_CARE_PROVIDER_SITE_OTHER): Payer: 59 | Admitting: Podiatry

## 2022-04-03 ENCOUNTER — Ambulatory Visit: Payer: No Typology Code available for payment source | Admitting: Family Medicine

## 2022-04-03 DIAGNOSIS — E1142 Type 2 diabetes mellitus with diabetic polyneuropathy: Secondary | ICD-10-CM | POA: Diagnosis not present

## 2022-04-03 DIAGNOSIS — L97422 Non-pressure chronic ulcer of left heel and midfoot with fat layer exposed: Secondary | ICD-10-CM

## 2022-04-03 NOTE — Progress Notes (Signed)
Please inform patient of the following:  Thyroid levels are improving. I would like to check one more time before we make any medication chagnes. Please have her come back in 3-4 weeks to recheck TSH, free t4 and free t3.   Potassium levels are back to normal. Do not need to do any further testing for this.

## 2022-04-03 NOTE — Progress Notes (Signed)
Subjective:  Patient ID: Traci Harper, female    DOB: Sep 01, 1955,  MRN: [id number]  Chief Complaint  Patient presents with   Wound Check    Left foot heel wound check. Changing dressing and applying xeroform.     66 y.o. female presents for follow-up of left heel ulceration.  Patient has been doing well since last appointment.  She has been doing every other day Xeroform dressing changes to the left heel.  Thinks it is improving.  Has not seen any increase in redness.  She does have a lot of dry skin on the left foot has not been getting it wet in the shower as instructed.  She is still using a postoperative shoe as instructed.  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 79   Thyroid disease     Allergies  Allergen Reactions   Lisinopril Cough    ROS: Negative except as per HPI above  Objective:  General: AAO x3, NAD  Dermatological:Left heel ulceration progressing very well with increased healing from last visit.  Healthy pink epithelium is present throughout the majority of the prior wound base.  There is some hyperkeratotic tissue surrounding the prior ulcer.  There are some dry xerotic skin that was removed during this visit.  Healthy skin underlying.  No open wound or drainage noted.  No erythema malodor or deep ulceration.  Vascular:  Dorsalis Pedis artery and Posterior Tibial artery pedal pulses are 2/4 bilateral.  Capillary fill time < 3 sec to all digits.   Neruologic: Grossly intact via light touch bilateral. Protective threshold intact to all sites bilateral.   Musculoskeletal: No gross boney pedal deformities bilateral. No pain, crepitus, or limitation noted with foot and ankle range of motion bilateral. Muscular strength 5/5 in all groups tested bilateral.  Gait: Unassisted, Nonantalgic.          Assessment:   1.  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 DM type 2 with diabetic peripheral neuropathy ([place])       Plan:  Patient was evaluated and treated and all questions answered.  #Ulcer of left heel with fat layer exposed, improving from prior no evidence of infection at this time.  -Discussed with the patient want her to continue every third day dressing changes.  This will be with Xeroform 4 x 4 Kerlix Ace roll. -Okay to be weightbearing as tolerated on the left foot in postop shoe -No indication for further antibiotics at this time no sign of infection -Continue to monitor for any redness swelling or drainage in the left foot or leg. -As the wound has mostly healed then at this point time I am okay with her not getting the left foot wet and washing with soap and water as long as she dries the heel area off very thoroughly after and reapplies dressing. -Continue to offload the left heel when sleeping or laying down so that there is no pressure on the posterior aspect of the heel I floating off pillows. -Patient will follow-up in 3 weeks for further wound check  Return in about 3 weeks (around 04/24/2022) for Follow up left heel wound.          Everitt Amber, DPM [facility]

## 2022-04-07 ENCOUNTER — Other Ambulatory Visit: Payer: Self-pay | Admitting: *Deleted

## 2022-04-07 DIAGNOSIS — E039 Hypothyroidism, unspecified: Secondary | ICD-10-CM

## 2022-04-07 MED ORDER — LEVOTHYROXINE SODIUM 175 MCG PO TABS: 175.0000 ug | ORAL_TABLET | Freq: Every day | ORAL | 1 refills | Status: DC

## 2022-04-07 NOTE — Progress Notes (Signed)
I am ok with endocrinology referral but it may take a few months.   If she is having symptoms then it would be reasonable for [place] to increase the dose of her thyroid medication. Please send in new rx for 121mcg daily.  We should recheck labs in 4-6 weeks.  Algis Greenhouse. Jerline Pain, MD 04/07/2022 2:44 PM

## 2022-04-08 ENCOUNTER — Other Ambulatory Visit: Payer: Self-pay | Admitting: Family Medicine

## 2022-04-28 ENCOUNTER — Other Ambulatory Visit (INDEPENDENT_AMBULATORY_CARE_PROVIDER_SITE_OTHER): Payer: 59

## 2022-04-28 DIAGNOSIS — E039 Hypothyroidism, unspecified: Secondary | ICD-10-CM | POA: Diagnosis not present

## 2022-04-28 LAB — TSH: TSH: 13.88 u[IU]/mL — ABNORMAL HIGH (ref 0.35–5.50)

## 2022-04-28 LAB — T3, FREE: T3, Free: 3.4 pg/mL (ref 2.3–4.2)

## 2022-04-28 LAB — T4, FREE: Free T4: 0.96 ng/dL (ref 0.60–1.60)

## 2022-04-30 NOTE — Progress Notes (Signed)
Please inform patient of the following:  Her thyroid levels are still not at goal.  Can we please verify that she has been taking every morning on an empty stomach without any other medications?  If so we should increase her dose to Synthroid 200 mcg daily.  Please send in a prescription if needed.  We need to recheck again in 4 to 6 weeks.

## 2022-05-01 ENCOUNTER — Ambulatory Visit (INDEPENDENT_AMBULATORY_CARE_PROVIDER_SITE_OTHER): Payer: 59 | Admitting: Podiatry

## 2022-05-01 DIAGNOSIS — B351 Tinea unguium: Secondary | ICD-10-CM

## 2022-05-01 DIAGNOSIS — L97422 Non-pressure chronic ulcer of left heel and midfoot with fat layer exposed: Secondary | ICD-10-CM

## 2022-05-01 DIAGNOSIS — E1142 Type 2 diabetes mellitus with diabetic polyneuropathy: Secondary | ICD-10-CM

## 2022-05-01 NOTE — Progress Notes (Signed)
  Subjective:  Patient ID: Traci Harper, female    DOB: Mar 22, 1956,  MRN: [id number]  Chief Complaint  Patient presents with   Follow-up    Patient is here for follow-up left foot, she also states that the right foot great toe has a fungus starting.    66 y.o. female presents for follow-up of left heel ulceration.  Patient has been doing well since last appointment.  She reports the wound has fully healed .  She does want the right great toe nail checked concern for possible fungal infection.  Denies pain at the right great toenail.  Denies any drainage denies nausea vomiting fever chills.  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 38   Thyroid disease     Allergies  Allergen Reactions   Lisinopril Cough    ROS: Negative except as per HPI above  Objective:  General: AAO x3, NAD  Dermatological:Left heel ulceration fully healed with no open ulceration no erythema no edema no sign of infection nontender to palpation.  Mild xerosis of the skin present where the ulceration had been previously.  Thickening discoloration subungual debris consistent with fungal infection of the right hallux nail nontender to palpation.  Vascular:  Dorsalis Pedis artery and Posterior Tibial artery pedal pulses are 2/4 bilateral.  Capillary fill time < 3 sec to all digits.   Neruologic: Grossly intact via light touch bilateral. Protective threshold intact to all sites bilateral.   Musculoskeletal: No gross boney pedal deformities bilateral. No pain, crepitus, or limitation noted with foot and ankle range of motion bilateral. Muscular strength 5/5 in all groups tested bilateral.  Gait: Unassisted, Nonantalgic.     Assessment:   1. 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 DM type 2 with diabetic peripheral neuropathy (HCC)   3.  Onychomycosis     Plan:  Patient was evaluated and treated and all questions answered.  #Ulcer of left heel with fat layer exposed, fully healed at this time with no sign of infection -Prior left heel ulceration is now fully healed.  Okay to return to prior activities -Okay to be weightbearing as tolerated in regular shoes -No indication for further antibiotics at this time no sign of infection -Continue to monitor for any redness swelling or drainage in the left foot or leg. -Continue to offload the left heel when sleeping or laying down so that there is no pressure on the posterior aspect of the heel as a preventative measure  #Onychomycosis right hallux nail -Continue to monitor discussed with patient treatment options we would like to defer use of any oral or topical antifungal medications  Return in about 6 weeks (around 06/12/2022) for Diabetic foot exam.          Corinna Gab, DPM [facility]

## 2022-05-05 ENCOUNTER — Telehealth: Payer: Self-pay | Admitting: Family Medicine

## 2022-05-05 NOTE — Telephone Encounter (Signed)
Pt States: -Saw this message from PCP team: "Her thyroid levels are still not at goal. Can we please verify that she has been taking every morning on an empty stomach without any other medications? " -Yes, takes every morning with out food.  -No, takes all medication at the same time. Has never isolated the Thyroid medication administration from the other Rx. -Has a few days left of RX.    Preferred Pharmacy: [facility] [address] [place], [place] - [address] [place] [address] Phone: [phone]  Fax: [phone]

## 2022-05-06 ENCOUNTER — Other Ambulatory Visit: Payer: Self-pay

## 2022-05-06 DIAGNOSIS — E039 Hypothyroidism, unspecified: Secondary | ICD-10-CM

## 2022-05-06 MED ORDER — LEVOTHYROXINE SODIUM 175 MCG PO TABS: 175.0000 ug | ORAL_TABLET | Freq: Every day | ORAL | 1 refills | Status: DC

## 2022-05-06 NOTE — Telephone Encounter (Signed)
Recommend she isolate the synthroid and take it by itself. We should recheck in 4-6 weeks.

## 2022-05-06 NOTE — Telephone Encounter (Signed)
Patient is scheduled to see PCP 1/5 Refill sent to pharmacy

## 2022-05-15 ENCOUNTER — Encounter: Payer: Self-pay | Admitting: *Deleted

## 2022-05-30 ENCOUNTER — Other Ambulatory Visit: Payer: Self-pay | Admitting: Family Medicine

## 2022-06-05 ENCOUNTER — Ambulatory Visit: Payer: No Typology Code available for payment source | Admitting: Family Medicine

## 2022-06-09 ENCOUNTER — Other Ambulatory Visit: Payer: Self-pay | Admitting: Family Medicine

## 2022-06-13 ENCOUNTER — Encounter: Payer: Self-pay | Admitting: Family Medicine

## 2022-06-13 ENCOUNTER — Ambulatory Visit (INDEPENDENT_AMBULATORY_CARE_PROVIDER_SITE_OTHER): Payer: 59 | Admitting: Family Medicine

## 2022-06-13 VITALS — BP 161/99 | HR 72 | Temp 98.4°F | Ht 63.0 in | Wt 244.8 lb

## 2022-06-13 DIAGNOSIS — E1165 Type 2 diabetes mellitus with hyperglycemia: Secondary | ICD-10-CM

## 2022-06-13 DIAGNOSIS — E039 Hypothyroidism, unspecified: Secondary | ICD-10-CM

## 2022-06-13 DIAGNOSIS — G4730 Sleep apnea, unspecified: Secondary | ICD-10-CM | POA: Diagnosis not present

## 2022-06-13 DIAGNOSIS — E1159 Type 2 diabetes mellitus with other circulatory complications: Secondary | ICD-10-CM | POA: Diagnosis not present

## 2022-06-13 DIAGNOSIS — I152 Hypertension secondary to endocrine disorders: Secondary | ICD-10-CM

## 2022-06-13 LAB — TSH: TSH: 7.21 u[IU]/mL — ABNORMAL HIGH (ref 0.35–5.50)

## 2022-06-13 LAB — POCT GLYCOSYLATED HEMOGLOBIN (HGB A1C): Hemoglobin A1C: 7.1 % — AB (ref 4.0–5.6)

## 2022-06-13 MED ORDER — LEVOTHYROXINE SODIUM 175 MCG PO TABS: 175.0000 ug | ORAL_TABLET | Freq: Every day | ORAL | 3 refills | Status: DC

## 2022-06-13 MED ORDER — TRULICITY 3 MG/0.5ML ~~LOC~~ SOAJ: 3.0000 mg | SUBCUTANEOUS | 3 refills | Status: DC

## 2022-06-13 MED ORDER — ELIQUIS 5 MG PO TABS: 5.0000 mg | ORAL_TABLET | Freq: Two times a day (BID) | ORAL | 3 refills | Status: DC

## 2022-06-13 MED ORDER — OLMESARTAN MEDOXOMIL 20 MG PO TABS: 20.0000 mg | ORAL_TABLET | Freq: Every day | ORAL | 3 refills | Status: DC

## 2022-06-13 NOTE — Progress Notes (Signed)
   Traci Harper is a 67 y.o. female who presents today for an office visit.  Assessment/Plan:  Chronic Problems Addressed Today: Type 2 diabetes mellitus with hyperglycemia (HCC) A1c improved to 7.1.  We will also increase her Trulicity to 3 mg weekly.  Continue metformin 1000 mg twice daily.  Discussed lifestyle modifications.  She will come back in 3 months and we can recheck A1c.  Hypertension associated with diabetes ([place]) Blood pressure above goal today.  She was elevated at her last office visit has been elevated at home as well.  Will increase her olmesartan to 20 mg daily.  Continue metoprolol to tartrate 50 mg twice daily.  She will continue to monitor at home and check with [place] in a few weeks via MyChart.  We will recheck in the office in 3 months.  Hypothyroidism She is on Synthroid 175 mcg daily and has been taking appropriately.  Will recheck TSH today.  I  Sleep-disordered breathing History consistent with OSA.  Could be contributing to her blood pressure readings as well.  Will place referral for sleep study.     Subjective:  HPI:  Patient here for follow-up.  We last saw her 3 months ago.  At that time A1c was recently 8.0.  We continued current regimen metformin 1000 mg twice daily, Trulicity 1.5 mg weekly.  She has done well with this.  We also found that her TSH was elevated.  She had not been isolating her synthroid however has since started to do this. .  She is concerned about sleep apnea. Husband has seen her stop breathing at night. She has also waken up at night due difficultly breathing.   She has had elevated blood pressure for the last several weeks.  Typically 150s and 160s.       Objective:  Physical Exam: BP (!) 161/99   Pulse 72   Temp 98.4 F (36.9 C) (Temporal)   Ht 5\' 3"  (1.6 m)   Wt 244 lb 12.8 oz (111 kg)   SpO2 96%   BMI 43.36 kg/m   Gen: No acute distress, resting comfortably CV: Regular rate and rhythm with no murmurs  appreciated Pulm: Normal work of breathing, clear to auscultation bilaterally with no crackles, wheezes, or rhonchi Neuro: Grossly normal, moves all extremities Psych: Normal affect and thought content      Traci Harper M. Jerline Pain, MD 06/13/2022 10:55 AM

## 2022-06-13 NOTE — Assessment & Plan Note (Signed)
A1c improved to 7.1.  We will also increase her Trulicity to 3 mg weekly.  Continue metformin 1000 mg twice daily.  Discussed lifestyle modifications.  She will come back in 3 months and we can recheck A1c.

## 2022-06-13 NOTE — Assessment & Plan Note (Signed)
Blood pressure above goal today.  She was elevated at her last office visit has been elevated at home as well.  Will increase her olmesartan to 20 mg daily.  Continue metoprolol to tartrate 50 mg twice daily.  She will continue to monitor at home and check with [place] in a few weeks via MyChart.  We will recheck in the office in 3 months.

## 2022-06-13 NOTE — Patient Instructions (Signed)
It was very nice to see you today!  We will refer you for sleep study.  Will increase your olmesartan to 20 mg daily.  Will increase your Trulicity 3 mg weekly.    Please send a message in a few weeks to let me know how your blood pressure is looking.  Will see back in 3 months to recheck your A1c.  Take care, Dr Jerline Pain  PLEASE NOTE:  If you had any lab tests, please let us know if you have not heard back within a few days. You may see your results on mychart before we have a chance to review them but we will give you a call once they are reviewed by [place].   If we ordered any referrals today, please let us know if you have not heard from their office within the next week.   If you had any urgent prescriptions sent in today, please check with the pharmacy within an hour of our visit to make sure the prescription was transmitted appropriately.   Please try these tips to maintain a healthy lifestyle:  Eat at least 3 REAL meals and 1-2 snacks per day.  Aim for no more than 5 hours between eating.  If you eat breakfast, please do so within one hour of getting up.   Each meal should contain half fruits/vegetables, one quarter protein, and one quarter carbs (no bigger than a computer mouse)  Cut down on sweet beverages. This includes juice, soda, and sweet tea.   Drink at least 1 glass of water with each meal and aim for at least 8 glasses per day  Exercise at least 150 minutes every week.

## 2022-06-13 NOTE — Assessment & Plan Note (Signed)
She is on Synthroid 175 mcg daily and has been taking appropriately.  Will recheck TSH today.  I

## 2022-06-13 NOTE — Assessment & Plan Note (Signed)
History consistent with OSA.  Could be contributing to her blood pressure readings as well.  Will place referral for sleep study.

## 2022-06-17 NOTE — Progress Notes (Signed)
Please inform patient of the following:  TSH still at goal.  Recommend we increase her synthroid dose to 200 mcg daily.  Please send a new prescription.  Would like for her to come back in 6 months to recheck.

## 2022-06-18 ENCOUNTER — Other Ambulatory Visit: Payer: Self-pay | Admitting: *Deleted

## 2022-06-18 MED ORDER — LEVOTHYROXINE SODIUM 200 MCG PO TABS: 200.0000 ug | ORAL_TABLET | Freq: Every day | ORAL | 0 refills | Status: DC

## 2022-06-23 ENCOUNTER — Other Ambulatory Visit: Payer: Self-pay | Admitting: Family Medicine

## 2022-07-02 LAB — HM DIABETES EYE EXAM

## 2022-07-08 ENCOUNTER — Ambulatory Visit: Payer: 59 | Admitting: Neurology

## 2022-07-08 ENCOUNTER — Encounter: Payer: Self-pay | Admitting: Neurology

## 2022-07-08 VITALS — BP 125/82 | HR 77 | Ht 62.0 in | Wt 239.2 lb

## 2022-07-08 DIAGNOSIS — R0683 Snoring: Secondary | ICD-10-CM

## 2022-07-08 DIAGNOSIS — Z820 Family history of epilepsy and other diseases of the nervous system: Secondary | ICD-10-CM

## 2022-07-08 DIAGNOSIS — R519 Headache, unspecified: Secondary | ICD-10-CM

## 2022-07-08 DIAGNOSIS — G4719 Other hypersomnia: Secondary | ICD-10-CM

## 2022-07-08 DIAGNOSIS — R0689 Other abnormalities of breathing: Secondary | ICD-10-CM

## 2022-07-08 DIAGNOSIS — R000 Tachycardia, unspecified: Secondary | ICD-10-CM

## 2022-07-08 DIAGNOSIS — R351 Nocturia: Secondary | ICD-10-CM

## 2022-07-08 DIAGNOSIS — R0681 Apnea, not elsewhere classified: Secondary | ICD-10-CM

## 2022-07-08 DIAGNOSIS — G4730 Sleep apnea, unspecified: Secondary | ICD-10-CM | POA: Diagnosis not present

## 2022-07-08 DIAGNOSIS — Z944 Liver transplant status: Secondary | ICD-10-CM

## 2022-07-08 NOTE — Patient Instructions (Signed)

## 2022-07-08 NOTE — Progress Notes (Signed)
Subjective:    Patient ID: Traci Harper is a 67 y.o. female.  HPI    Star Age, MD, PhD [facility] [address], [address] [address] [place], [place] [address]  Dear Dr. Jerline Pain,  I saw your patient, Traci Harper, upon your kind request in my sleep clinic today for initial consultation of her sleep disorder, in particular, concern for underlying obstructive sleep apnea.  The patient is unaccompanied today.  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  Her Epworth sleepiness score is 18 out of 24, fatigue severity score is 53 out of 63. I reviewed your office note from 06/13/2022.  She has been sleepy at the wheel and sometimes has to pull over to take a quick nap, she visits her mom in [place] and has to take a nap before she comes back.  She reports that her son has sleep apnea and she is also familiar with the diagnosis secondary to her husband sleep apnea diagnosis, he uses a CPAP faithfully.  She goes to bed late, around midnight or 1 AM, rise time is generally around 10 AM.  She has significant nocturia about 2-3 times per average night and has had recurrent, nearly daily morning headaches which are described as posterior, dull, achy, nagging, and self-limited, not like a migraine.  She has a TV on in her bedroom especially when her husband travels, he works full-time and travels [employer].  They have 2 cats in the household.  She is working on weight loss.  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  She quit smoking over 20 years ago.  She has a history of alcohol use disorder and quit drinking alcohol before her liver transplant.  She has 2 grown sons and 3 granddaughters.  She drinks caffeine in the form of coffee, about 2 cups in the morning, very occasional soda.  She has occasional nocturnal foot cramps.  Her Past Medical History Is Significant For: Past Medical History:  Diagnosis Date   Diabetes mellitus    Hepatic encephalopathy ([place])    Hypertension    Hypothyroidism    Kidney insufficiency    Molar pregnancy    at age 40   Thyroid disease     Her Past Surgical History Is Significant For: Past Surgical History:  Procedure Laterality Date   APPENDECTOMY     BACK SURGERY     BREAST BIOPSY Right    CESAREAN SECTION     x2   CHOLECYSTECTOMY     DILATION AND CURETTAGE OF UTERUS     at [facility]     LIVER TRANSPLANT  09/17/2012   @[email or web address]   THYROID SURGERY     x2   TUBAL LIGATION     V-TACH ABLATION      Her Family History Is Significant For: Family History  Problem Relation Age of Onset   Diabetes Mother    Heart disease Father  Diabetes Father    Sleep apnea Son     Her Social History Is Significant For: Social History   Socioeconomic History   Marital status: Married    Spouse name: Not on file   Number of children: Not on file   Years of education: Not on file   Highest education level: Not on file  Occupational History   Not on file  Tobacco Use   Smoking status: Former    Types: Cigarettes    Quit date: 06/02/2002    Years since quitting: 20.1   Smokeless tobacco: Not on file  Substance and Sexual Activity   Alcohol use: No    Comment: No alcohol since 2012   Drug use: No   Sexual activity: Never  Other Topics Concern   Not on file  Social History Narrative   Not on file   Social Determinants of Health   Financial  Resource Strain: Not on file  Food Insecurity: No Food Insecurity (03/14/2022)   Hunger Vital Sign    Worried About Running Out of Food in the Last Year: Never true    Ran Out of Food in the Last Year: Never true  Transportation Needs: No Transportation Needs (03/14/2022)   PRAPARE - Hydrologist (Medical): No    Lack of Transportation (Non-Medical): No  Physical Activity: Not on file  Stress: Not on file  Social Connections: Not on file    Her Allergies Are:  Allergies  Allergen Reactions   Lisinopril Cough  :   Her Current Medications Are:  Outpatient Encounter Medications as of 07/08/2022  Medication Sig   Ascorbic Acid (VITAMIN C) 100 MG tablet Take 100 mg by mouth daily.   Blood Glucose Monitoring Suppl (ONETOUCH VERIO FLEX SYSTEM) w/Device KIT USE   TO CHECK GLUCOSE FOR BLOOD SUGAR   calcium citrate-vitamin D (CITRACAL+D) 315-200 MG-UNIT tablet Take 1 tablet by mouth daily.   Dulaglutide (TRULICITY) 3 QQ/7.6PP SOPN Inject 3 mg as directed once a week.   ELIQUIS 5 MG TABS tablet Take 1 tablet (5 mg total) by mouth 2 (two) times daily.   glucose blood test strip 1 each by Other route as needed for other. Use as instructed   levothyroxine (SYNTHROID) 200 MCG tablet Take 1 tablet (200 mcg total) by mouth daily.   magnesium oxide (MAG-OX) 400 MG tablet Take 1 tablet (400 mg total) by mouth 2 (two) times daily. (Patient taking differently: Take 800 mg by mouth at bedtime.)   metFORMIN (GLUCOPHAGE) 1000 MG tablet Take 1 tablet (1,000 mg total) by mouth 2 (two) times daily with a meal.   metoprolol tartrate (LOPRESSOR) 50 MG tablet Take 1 tablet by mouth twice daily   Multiple Vitamin (ONE DAILY) tablet Take 1 tablet by mouth daily.    mycophenolate (MYFORTIC) 360 MG TBEC Take 360 mg by mouth 2 (two) times daily.    olmesartan (BENICAR) 20 MG tablet Take 1 tablet (20 mg total) by mouth daily.   omeprazole (PRILOSEC) 20 MG capsule Take 1 capsule (20 mg  total) by mouth daily.   PROGRAF 0.5 MG capsule Take 0.5 mg by mouth 2 (two) times daily.   levocetirizine (XYZAL) 5 MG tablet Take 1 tablet (5 mg total) by mouth every evening.   sulfamethoxazole-trimethoprim (BACTRIM DS) 800-160 MG tablet Take 1 tablet by mouth 2 (two) times daily.   No facility-administered encounter medications on file as of 07/08/2022.  :   Review of Systems:  Out  of a complete 14 point review of systems, all are reviewed and negative with the exception of these symptoms as listed below:  Review of Systems  Neurological:        Pt here for sleep consult Pt snores, fatigue,hypertension,headaches. Pt denies sleep study, CPAP machine     ESS:18 FSS:53    Objective:  Neurological Exam  Physical Exam Physical Examination:   Vitals:   07/08/22 1223 07/08/22 1228  BP: (!) 148/83 125/82  Pulse: 77 77    General Examination: The patient is a very pleasant 67 y.o. female in no acute distress. She appears well-developed and well-nourished and well groomed.   HEENT: Normocephalic, atraumatic, pupils are equal, round and reactive to light, extraocular tracking is good without limitation to gaze excursion or nystagmus noted. Hearing is grossly intact. Face is symmetric with normal facial animation. Speech is clear with no dysarthria noted. There is no hypophonia. There is no lip, neck/head, jaw or voice tremor. Neck is supple with full range of passive and active motion. There are no carotid bruits on auscultation. Oropharynx exam reveals: mild mouth dryness, adequate dental hygiene and moderate airway crowding, due to small airway entry and redundant soft palate.  Mallampati class II, small uvula and smaller tonsils noted.  Tongue protrudes centrally and palate elevates symmetrically, neck circumference 15-1/2 inches.  Minimal overbite.  Chest: Clear to auscultation without wheezing, rhonchi or crackles noted.  Heart: S1+S2+0, regular and normal without murmurs, rubs or  gallops noted.   Abdomen: Soft, non-tender and non-distended.  Extremities: There is trace pitting edema in the distal lower extremities bilaterally.   Skin: Warm and dry without trophic changes noted.   Musculoskeletal: exam reveals no obvious joint deformities.   Neurologically:  Mental status: The patient is awake, alert and oriented in all 4 spheres. Her immediate and remote memory, attention, language skills and fund of knowledge are appropriate. There is no evidence of aphasia, agnosia, apraxia or anomia. Speech is clear with normal prosody and enunciation. Thought process is linear. Mood is normal and affect is normal.  Cranial nerves II - XII are as described above under HEENT exam.  Motor exam: Normal bulk, strength and tone is noted. There is no obvious action or resting tremor.  Fine motor skills and coordination: grossly intact.  Cerebellar testing: No dysmetria or intention tremor. There is no truncal or gait ataxia.  Sensory exam: intact to light touch in the upper and lower extremities.  Gait, station and balance: She stands easily. No veering to one side is noted. No leaning to one side is noted. Posture is age-appropriate and stance is narrow based. Gait shows normal stride length and normal pace. No problems turning are noted.   Assessment and Plan:  In summary, Traci Harper is a very pleasant 67 y.o.-year old female with an underlying medical history of hypertension, diabetes, hypothyroidism, kidney insufficiency, history of hepatic encephalopathy, and severe obesity with a BMI of over 40, whose history and physical exam are concerning for sleep disordered breathing, supporting a current working diagnosis of unspecified sleep apnea, with the main differential diagnoses of obstructive sleep apnea (OSA) versus upper airway resistance syndrome (UARS) versus central sleep apnea (CSA), or mixed sleep apnea. A laboratory attended sleep study is typically considered "gold standard"  for evaluation of sleep disordered breathing.   I had a long chat with the patient about my findings and the diagnosis of sleep apnea, particularly OSA, its prognosis and treatment options. We talked about medical/conservative  treatments, surgical interventions and non-pharmacological approaches for symptom control. I explained, in particular, the risks and ramifications of untreated moderate to severe OSA, especially with respect to developing cardiovascular disease down the road, including congestive heart failure (CHF), difficult to treat hypertension, cardiac arrhythmias (particularly A-fib), neurovascular complications including TIA, stroke and dementia. Even type 2 diabetes has, in part, been linked to untreated OSA. Symptoms of untreated OSA may include (but may not be limited to) daytime sleepiness, nocturia (i.e. frequent nighttime urination), memory problems, mood irritability and suboptimally controlled or worsening mood disorder such as depression and/or anxiety, lack of energy, lack of motivation, physical discomfort, as well as recurrent headaches, especially morning or nocturnal headaches. We talked about the importance of maintaining a healthy lifestyle and striving for healthy weight. In addition, we talked about the importance of striving for and maintaining good sleep hygiene. I recommended a sleep study at this time. I outlined the differences between a laboratory attended sleep study which is considered more comprehensive and accurate over the option of a home sleep test (HST); the latter may lead to underestimation of sleep disordered breathing in some instances and does not help with diagnosing upper airway resistance syndrome and is not accurate enough to diagnose primary central sleep apnea typically. I outlined possible surgical and non-surgical treatment options of OSA, including the use of a positive airway pressure (PAP) device (i.e. CPAP, AutoPAP/APAP or BiPAP in certain  circumstances), a custom-made dental device (aka oral appliance, which would require a referral to a specialist dentist or orthodontist typically, and is generally speaking not considered for patients with full dentures or edentulous state), upper airway surgical options, such as traditional UPPP (which is not considered a first-line treatment) or the Inspire device (hypoglossal nerve stimulator, which would involve a referral for consultation with an ENT surgeon, after careful selection, following inclusion criteria - also not first-line treatment). I explained the PAP treatment option to the patient in detail, as this is generally considered first-line treatment.  The patient indicated that she would be willing to try PAP therapy, if the need arises. I explained the importance of being compliant with PAP treatment, not only for insurance purposes but primarily to improve patient's symptoms symptoms, and for the patient's long term health benefit, including to reduce Her cardiovascular risks longer-term.    We will pick up our discussion about the next steps and treatment options after testing.  We will keep her posted as to the test results by phone call and/or MyChart messaging where possible.  We will plan to follow-up in sleep clinic accordingly as well.  I answered all her questions today and the patient was in agreement.   I encouraged her to call with any interim questions, concerns, problems or updates or email [place] through [place].  Generally speaking, sleep test authorizations may take up to 2 weeks, sometimes less, sometimes longer, the patient is encouraged to get in touch with [place] if they do not hear back from the sleep lab staff directly within the next 2 weeks.  Thank you very much for allowing me to participate in the care of this nice patient. If I can be of any further assistance to you please do not hesitate to call me at [phone].  Sincerely,   Star Age, MD, PhD

## 2022-07-14 ENCOUNTER — Telehealth: Payer: Self-pay | Admitting: Neurology

## 2022-07-14 NOTE — Telephone Encounter (Signed)
07/09/22 [employer] no auth req   HST- left VM 07/10/22

## 2022-07-21 IMAGING — DX DG CHEST 2V
2 series · 2 of 2 positions shown · non-contrast
Comparison: 01/01/2017

CLINICAL DATA: Immune suppression, upper respiratory infection

EXAM:
CHEST - 2 VIEW

[chest pa]
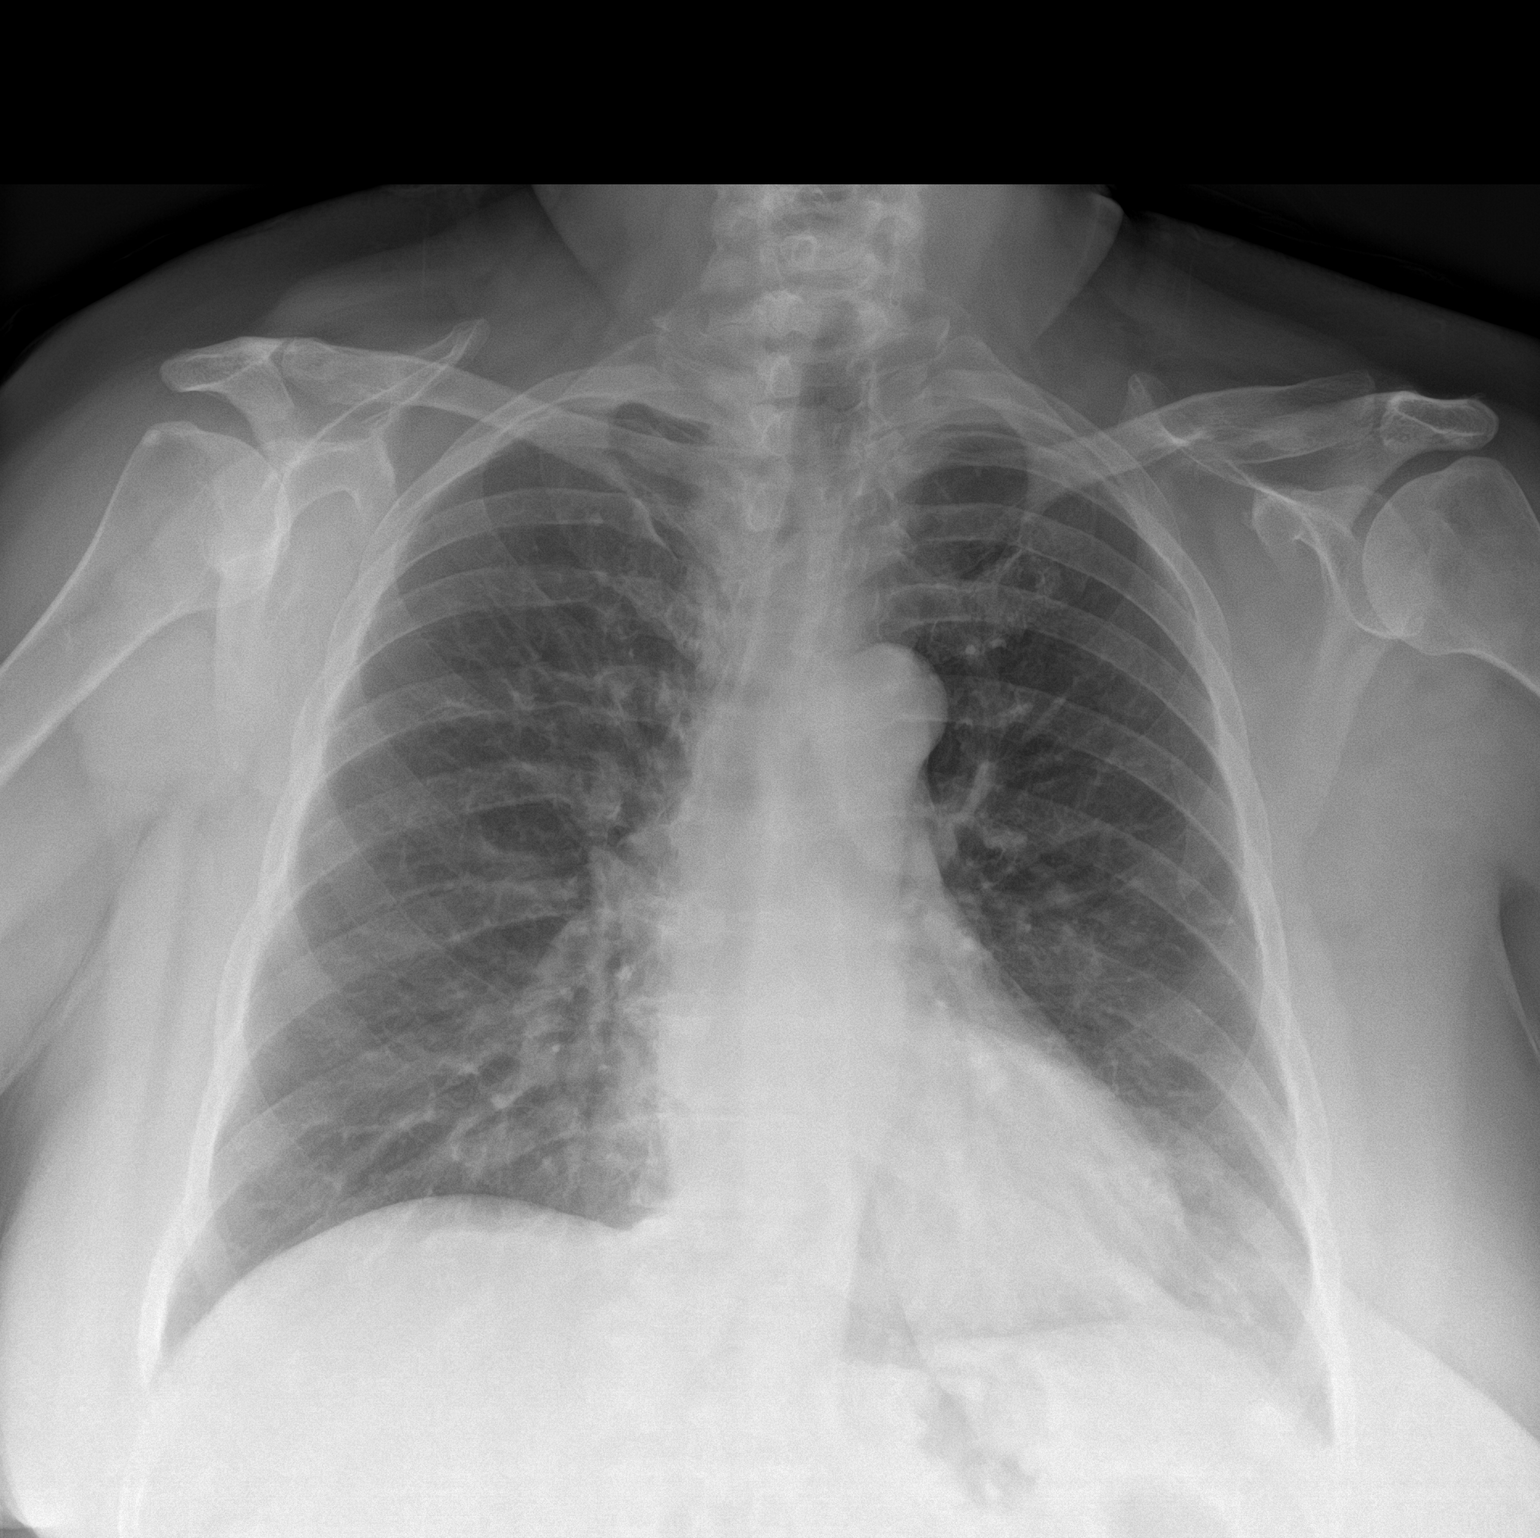

[chest lat]
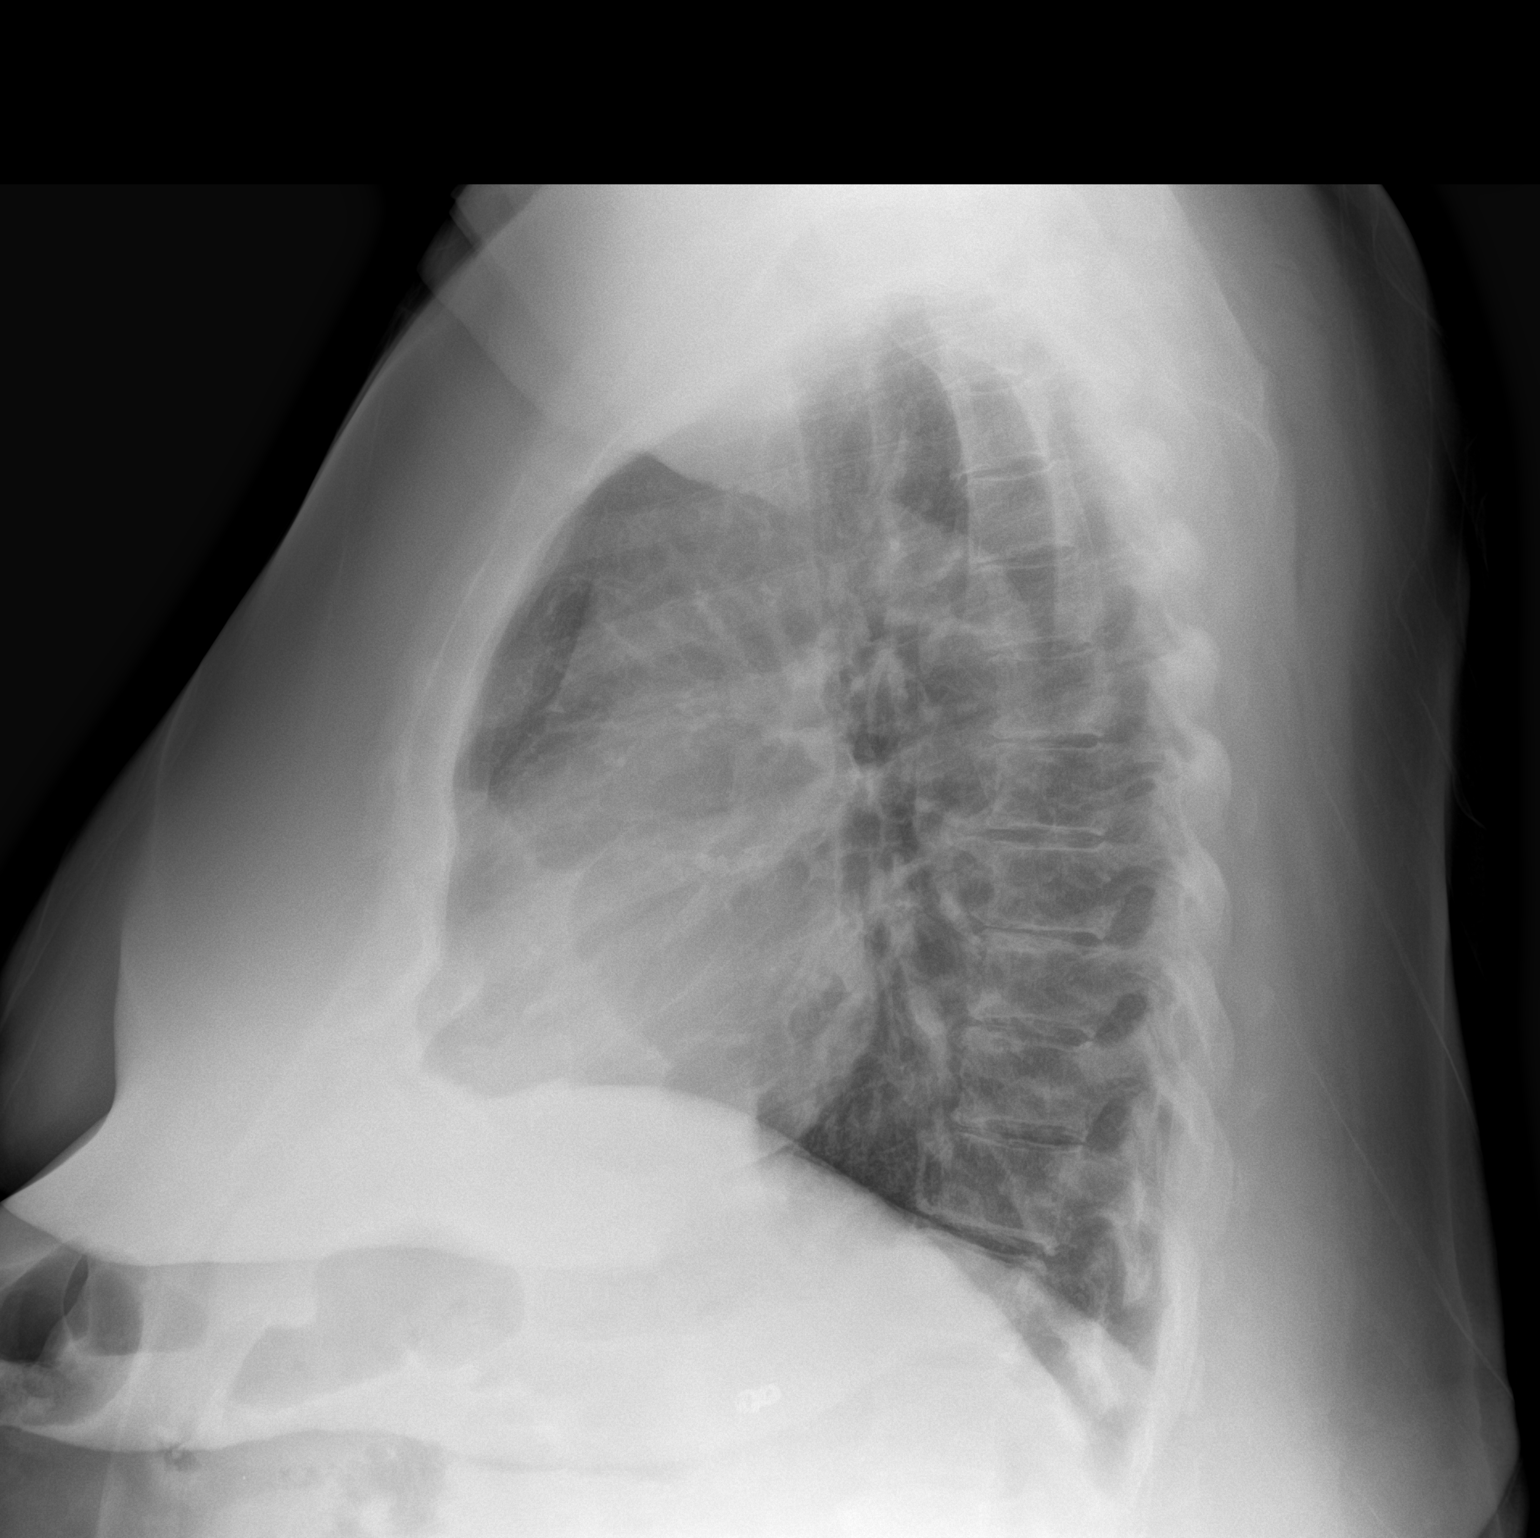

[2 of 2 positions shown; findings below may reference images not displayed]

FINDINGS: The heart size and mediastinal contours are within normal limits. No
focal airspace consolidation, pleural effusion, or pneumothorax. The
visualized skeletal structures are unremarkable.
IMPRESSION: No active cardiopulmonary disease.

## 2022-08-27 ENCOUNTER — Other Ambulatory Visit: Payer: Self-pay | Admitting: Family Medicine

## 2022-09-12 ENCOUNTER — Encounter: Payer: Self-pay | Admitting: Family Medicine

## 2022-09-12 ENCOUNTER — Ambulatory Visit (INDEPENDENT_AMBULATORY_CARE_PROVIDER_SITE_OTHER): Payer: 59 | Admitting: Family Medicine

## 2022-09-12 VITALS — BP 144/91 | HR 85 | Temp 96.9°F | Ht 62.0 in | Wt 233.6 lb

## 2022-09-12 DIAGNOSIS — I152 Hypertension secondary to endocrine disorders: Secondary | ICD-10-CM | POA: Diagnosis not present

## 2022-09-12 DIAGNOSIS — E1165 Type 2 diabetes mellitus with hyperglycemia: Secondary | ICD-10-CM

## 2022-09-12 DIAGNOSIS — E1159 Type 2 diabetes mellitus with other circulatory complications: Secondary | ICD-10-CM | POA: Diagnosis not present

## 2022-09-12 DIAGNOSIS — E039 Hypothyroidism, unspecified: Secondary | ICD-10-CM

## 2022-09-12 LAB — POCT GLYCOSYLATED HEMOGLOBIN (HGB A1C): Hemoglobin A1C: 6.2 % — AB (ref 4.0–5.6)

## 2022-09-12 LAB — TSH: TSH: 0.44 u[IU]/mL (ref 0.35–5.50)

## 2022-09-12 NOTE — Progress Notes (Signed)
   Traci Harper is a 67 y.o. female who presents today for an office visit.  Assessment/Plan:  Chronic Problems Addressed Today: Type 2 diabetes mellitus with hyperglycemia (HCC) A1c stable 6.2.  Doing well with current regimen Trulicity 3 mg weekly and metformin 1000 mg twice daily.  Discussed lifestyle modifications.  Recheck A1c in 3 months.  Hypothyroidism Doing well with higher dose Synthroid 200 mcg daily.  She is taking this appropriately.  Will recheck TSH today.  Hypertension associated with diabetes (HCC) A little bit elevated today.  She has been under more stress recently and has not been checking at home.  Will continue current regimen olmesartan 20 mg daily and Metroprolol tartrate 50 mg twice daily.  She will monitor at home and check in with [place] in a few weeks.  Recheck in 3 months.     Subjective:  HPI:  See A/p for status of chronic conditions.  She is here today for follow. Last seen about 3 months ago.  At that visit we increased Trulicity to 3 mg weekly.  We also increased her olmesartan to 20 mg daily.  She has done well with both of these.  Her TSH levels were off and we increased her Synthroid to 200 mcg daily.       Objective:  Physical Exam: BP (!) 144/91   Pulse 85   Temp (!) 96.9 F (36.1 C) (Temporal)   Ht 5\' 2"  (1.575 m)   Wt 233 lb 9.6 oz (106 kg)   SpO2 96%   BMI 42.73 kg/m   Wt Readings from Last 3 Encounters:  09/12/22 233 lb 9.6 oz (106 kg)  07/08/22 239 lb 3.2 oz (108.5 kg)  06/13/22 244 lb 12.8 oz (111 kg)  Gen: No acute distress, resting comfortably CV: Regular rate and rhythm with no murmurs appreciated Pulm: Normal work of breathing, clear to auscultation bilaterally with no crackles, wheezes, or rhonchi Neuro: Grossly normal, moves all extremities Psych: Normal affect and thought content      Carnetta Losada M. [name], MD 09/12/2022 10:47 AM

## 2022-09-12 NOTE — Assessment & Plan Note (Signed)
Doing well with higher dose Synthroid 200 mcg daily.  She is taking this appropriately.  Will recheck TSH today.

## 2022-09-12 NOTE — Assessment & Plan Note (Signed)
A little bit elevated today.  She has been under more stress recently and has not been checking at home.  Will continue current regimen olmesartan 20 mg daily and Metroprolol tartrate 50 mg twice daily.  She will monitor at home and check in with [place] in a few weeks.  Recheck in 3 months.

## 2022-09-12 NOTE — Patient Instructions (Signed)
It was very nice to see you today!  Your A1c today is 6.2.  Please continue good work with your diet and exercise.  We would not make any medication adjustments today.  Will check your thyroid levels.  Please monitor your blood pressure and let us know if persistently elevated.  I will see you back in 3 months.  Come back sooner if needed.  Take care, Dr [name]  PLEASE NOTE:  If you had any lab tests, please let us know if you have not heard back within a few days. You may see your results on mychart before we have a chance to review them but we will give you a call once they are reviewed by [place].   If we ordered any referrals today, please let us know if you have not heard from their office within the next week.   If you had any urgent prescriptions sent in today, please check with the pharmacy within an hour of our visit to make sure the prescription was transmitted appropriately.   Please try these tips to maintain a healthy lifestyle:  Eat at least 3 REAL meals and 1-2 snacks per day.  Aim for no more than 5 hours between eating.  If you eat breakfast, please do so within one hour of getting up.   Each meal should contain half fruits/vegetables, one quarter protein, and one quarter carbs (no bigger than a computer mouse)  Cut down on sweet beverages. This includes juice, soda, and sweet tea.   Drink at least 1 glass of water with each meal and aim for at least 8 glasses per day  Exercise at least 150 minutes every week.

## 2022-09-12 NOTE — Assessment & Plan Note (Signed)
A1c stable 6.2.  Doing well with current regimen Trulicity 3 mg weekly and metformin 1000 mg twice daily.  Discussed lifestyle modifications.  Recheck A1c in 3 months.

## 2022-09-13 ENCOUNTER — Other Ambulatory Visit: Payer: Self-pay | Admitting: Family Medicine

## 2022-09-16 NOTE — Progress Notes (Signed)
Please inform patient of the following:  Thyroid levels are at goal.  She should continue her current dose and we can recheck again in 6 to 12 months.

## 2022-09-22 ENCOUNTER — Other Ambulatory Visit: Payer: Self-pay | Admitting: Family Medicine

## 2022-09-27 ENCOUNTER — Other Ambulatory Visit: Payer: Self-pay | Admitting: Family Medicine

## 2022-10-13 ENCOUNTER — Telehealth: Payer: Self-pay | Admitting: *Deleted

## 2022-10-13 NOTE — Telephone Encounter (Signed)
[employer] form faxed to [phone]  Form faxed on 10/10/2022 Placed to be scan in patient chart

## 2022-10-17 ENCOUNTER — Other Ambulatory Visit: Payer: Self-pay | Admitting: Family Medicine

## 2022-10-19 ENCOUNTER — Other Ambulatory Visit: Payer: Self-pay | Admitting: Family Medicine

## 2022-10-21 ENCOUNTER — Other Ambulatory Visit: Payer: Self-pay | Admitting: Family Medicine

## 2022-10-30 ENCOUNTER — Ambulatory Visit: Payer: 59 | Admitting: Podiatry

## 2022-11-03 ENCOUNTER — Other Ambulatory Visit: Payer: Self-pay | Admitting: *Deleted

## 2022-11-03 ENCOUNTER — Telehealth: Payer: Self-pay | Admitting: Family Medicine

## 2022-11-03 MED ORDER — TRULICITY 3 MG/0.5ML ~~LOC~~ SOAJ: SUBCUTANEOUS | 0 refills | Status: DC

## 2022-11-03 NOTE — Telephone Encounter (Signed)
Patient states [facility] in [place] is out of stock of Trulicity.  Patient requests to be advised if she can substitute Greggory Keen, if so requests  [facility] - [address] [place], [place] - [address] Phone: [phone]  Fax: [phone]    Be called to make sure Pharmacy has the RX/Medication.Dosage in stock

## 2022-11-03 NOTE — Telephone Encounter (Signed)
Rx send to [facility] @ [address], [place], [place] [address]

## 2022-11-03 NOTE — Telephone Encounter (Signed)
Calller is patient's spouse. States he accidentally took her trulicity instead of his mounjaro. Caller is requesting a refill of medication sent since patient is out. States Pharmacy below has some left but they can go quickly.   .Prescription Request  11/03/2022  LOV: 09/12/2022  What is the name of the medication or equipment? TRULICITY 3 MG/0.5ML SOPN   Have you contacted your pharmacy to request a refill? Yes   Which pharmacy would you like this sent to? [facility] @ [address], [place], [place] [address]    Patient notified that their request is being sent to the clinical staff for review and that they should receive a response within 2 business days.   Please advise at Mobile [phone] (mobile)

## 2022-11-04 ENCOUNTER — Other Ambulatory Visit: Payer: Self-pay | Admitting: *Deleted

## 2022-11-04 MED ORDER — TIRZEPATIDE 10 MG/0.5ML ~~LOC~~ SOAJ: 10.0000 mg | SUBCUTANEOUS | 1 refills | Status: DC

## 2022-11-04 NOTE — Telephone Encounter (Signed)
Rx send to [facility] pharmacy  

## 2022-11-04 NOTE — Telephone Encounter (Signed)
See note   Left message to return call to our office at their convenience.

## 2022-11-04 NOTE — Telephone Encounter (Signed)
See note

## 2022-11-04 NOTE — Telephone Encounter (Signed)
Ok to switch to [employer] 10 mg weekly  [employer]. [name], MD 11/04/2022 1:58 PM

## 2022-11-05 NOTE — Telephone Encounter (Signed)
Spoke with patient stated Rx Traci Harper is not on stock in [facility]  May take a day or so to get a new shipment  Patient stated will wait for a couple of day before asking for transfer to other pharmacy

## 2022-11-05 NOTE — Telephone Encounter (Signed)
Patient returned call. Requests to be called. 

## 2022-11-24 ENCOUNTER — Other Ambulatory Visit: Payer: Self-pay | Admitting: Family Medicine

## 2022-12-15 ENCOUNTER — Encounter: Payer: Self-pay | Admitting: Family Medicine

## 2022-12-15 ENCOUNTER — Ambulatory Visit (INDEPENDENT_AMBULATORY_CARE_PROVIDER_SITE_OTHER): Payer: 59 | Admitting: Family Medicine

## 2022-12-15 VITALS — BP 120/79 | HR 84 | Temp 98.2°F | Ht 62.0 in | Wt 232.2 lb

## 2022-12-15 DIAGNOSIS — E1165 Type 2 diabetes mellitus with hyperglycemia: Secondary | ICD-10-CM | POA: Diagnosis not present

## 2022-12-15 DIAGNOSIS — E1169 Type 2 diabetes mellitus with other specified complication: Secondary | ICD-10-CM

## 2022-12-15 DIAGNOSIS — E039 Hypothyroidism, unspecified: Secondary | ICD-10-CM | POA: Diagnosis not present

## 2022-12-15 DIAGNOSIS — Z0001 Encounter for general adult medical examination with abnormal findings: Secondary | ICD-10-CM | POA: Diagnosis not present

## 2022-12-15 DIAGNOSIS — Z23 Encounter for immunization: Secondary | ICD-10-CM

## 2022-12-15 DIAGNOSIS — I152 Hypertension secondary to endocrine disorders: Secondary | ICD-10-CM

## 2022-12-15 DIAGNOSIS — Z944 Liver transplant status: Secondary | ICD-10-CM

## 2022-12-15 DIAGNOSIS — E785 Hyperlipidemia, unspecified: Secondary | ICD-10-CM

## 2022-12-15 DIAGNOSIS — E1159 Type 2 diabetes mellitus with other circulatory complications: Secondary | ICD-10-CM

## 2022-12-15 LAB — LIPID PANEL
Cholesterol: 213 mg/dL — ABNORMAL HIGH (ref 0–200)
HDL: 60.2 mg/dL (ref >39.00)
NonHDL: 152.88
Total CHOL/HDL Ratio: 4
Triglycerides: 340 mg/dL — ABNORMAL HIGH (ref 0.0–149.0)
VLDL: 68 mg/dL — ABNORMAL HIGH (ref 0.0–40.0)

## 2022-12-15 LAB — COMPREHENSIVE METABOLIC PANEL
ALT: 44 U/L — ABNORMAL HIGH (ref 0–35)
AST: 69 U/L — ABNORMAL HIGH (ref 0–37)
Albumin: 3.8 g/dL (ref 3.5–5.2)
Alkaline Phosphatase: 46 U/L (ref 39–117)
BUN: 11 mg/dL (ref 6–23)
CO2: 28 mEq/L (ref 19–32)
Calcium: 9.3 mg/dL (ref 8.4–10.5)
Chloride: 97 mEq/L (ref 96–112)
Creatinine, Ser: 1.21 mg/dL — ABNORMAL HIGH (ref 0.40–1.20)
GFR: 47.27 mL/min — ABNORMAL LOW (ref >60.00)
Glucose, Bld: 125 mg/dL — ABNORMAL HIGH (ref 70–99)
Potassium: 4.5 mEq/L (ref 3.5–5.1)
Sodium: 136 mEq/L (ref 135–145)
Total Bilirubin: 0.6 mg/dL (ref 0.2–1.2)
Total Protein: 6.1 g/dL (ref 6.0–8.3)

## 2022-12-15 LAB — CBC
HCT: 40.2 % (ref 36.0–46.0)
Hemoglobin: 13.3 g/dL (ref 12.0–15.0)
MCHC: 33.2 g/dL (ref 30.0–36.0)
MCV: 105.3 fl — ABNORMAL HIGH (ref 78.0–100.0)
Platelets: 190 10*3/uL (ref 150.0–400.0)
RBC: 3.81 Mil/uL — ABNORMAL LOW (ref 3.87–5.11)
RDW: 13.6 % (ref 11.5–15.5)
WBC: 5.1 10*3/uL (ref 4.0–10.5)

## 2022-12-15 LAB — TSH: TSH: 0.87 u[IU]/mL (ref 0.35–5.50)

## 2022-12-15 LAB — MICROALBUMIN / CREATININE URINE RATIO
Creatinine,U: 140.4 mg/dL
Microalb Creat Ratio: 16.6 mg/g (ref 0.0–30.0)
Microalb, Ur: 23.3 mg/dL — ABNORMAL HIGH (ref 0.0–1.9)

## 2022-12-15 LAB — LDL CHOLESTEROL, DIRECT: Direct LDL: 116 mg/dL

## 2022-12-15 LAB — HEMOGLOBIN A1C: Hgb A1c MFr Bld: 6.6 % — ABNORMAL HIGH (ref 4.6–6.5)

## 2022-12-15 MED ORDER — TIRZEPATIDE 10 MG/0.5ML ~~LOC~~ SOAJ: 10.0000 mg | SUBCUTANEOUS | 1 refills | Status: DC

## 2022-12-15 NOTE — Patient Instructions (Addendum)
It was very nice to see you today!  We will check blood work.   Please continue to work on diet and exercise.   Return in about 3 months (around 03/17/2023) for Follow Up.   Take care, Dr [name]  PLEASE NOTE:  If you had any lab tests, please let us know if you have not heard back within a few days. You may see your results on mychart before we have a chance to review them but we will give you a call once they are reviewed by [place].   If we ordered any referrals today, please let us know if you have not heard from their office within the next week.   If you had any urgent prescriptions sent in today, please check with the pharmacy within an hour of our visit to make sure the prescription was transmitted appropriately.   Please try these tips to maintain a healthy lifestyle:  Eat at least 3 REAL meals and 1-2 snacks per day.  Aim for no more than 5 hours between eating.  If you eat breakfast, please do so within one hour of getting up.   Each meal should contain half fruits/vegetables, one quarter protein, and one quarter carbs (no bigger than a computer mouse)  Cut down on sweet beverages. This includes juice, soda, and sweet tea.   Drink at least 1 glass of water with each meal and aim for at least 8 glasses per day  Exercise at least 150 minutes every week.    Preventive Care 65-65 Years Old, Female Preventive care refers to lifestyle choices and visits with your health care provider that can promote health and wellness. Preventive care visits are also called wellness exams. What can I expect for my preventive care visit? Counseling Your health care provider may ask you questions about your: Medical history, including: Past medical problems. Family medical history. Pregnancy history. Current health, including: Menstrual cycle. Method of birth control. Emotional well-being. Home life and relationship well-being. Sexual activity and sexual health. Lifestyle,  including: Alcohol, nicotine or tobacco, and drug use. Access to firearms. Diet, exercise, and sleep habits. Work and work Astronomer. Sunscreen use. Safety issues such as seatbelt and bike helmet use. Physical exam Your health care provider will check your: Height and weight. These may be used to calculate your BMI (body mass index). BMI is a measurement that tells if you are at a healthy weight. Waist circumference. This measures the distance around your waistline. This measurement also tells if you are at a healthy weight and may help predict your risk of certain diseases, such as type 2 diabetes and high blood pressure. Heart rate and blood pressure. Body temperature. Skin for abnormal spots. What immunizations do I need?  Vaccines are usually given at various ages, according to a schedule. Your health care provider will recommend vaccines for you based on your age, medical history, and lifestyle or other factors, such as travel or where you work. What tests do I need? Screening Your health care provider may recommend screening tests for certain conditions. This may include: Lipid and cholesterol levels. Diabetes screening. This is done by checking your blood sugar (glucose) after you have not eaten for a while (fasting). Pelvic exam and Pap test. Hepatitis B test. Hepatitis C test. HIV (human immunodeficiency virus) test. STI (sexually transmitted infection) testing, if you are at risk. Lung cancer screening. Colorectal cancer screening. Mammogram. Talk with your health care provider about when you should start having regular mammograms. This  may depend on whether you have a family history of breast cancer. BRCA-related cancer screening. This may be done if you have a family history of breast, ovarian, tubal, or peritoneal cancers. Bone density scan. This is done to screen for osteoporosis. Talk with your health care provider about your test results, treatment options, and if  necessary, the need for more tests. Follow these instructions at home: Eating and drinking  Eat a diet that includes fresh fruits and vegetables, whole grains, lean protein, and low-fat dairy products. Take vitamin and mineral supplements as recommended by your health care provider. Do not drink alcohol if: Your health care provider tells you not to drink. You are pregnant, may be pregnant, or are planning to become pregnant. If you drink alcohol: Limit how much you have to 0-1 drink a day. Know how much alcohol is in your drink. In the [place] one drink equals one 12 oz bottle of beer (355 mL), one 5 oz glass of wine (148 mL), or one 1 oz glass of hard liquor (44 mL). Lifestyle Brush your teeth every morning and night with fluoride toothpaste. Floss one time each day. Exercise for at least 30 minutes 5 or more days each week. Do not use any products that contain nicotine or tobacco. These products include cigarettes, chewing tobacco, and vaping devices, such as e-cigarettes. If you need help quitting, ask your health care provider. Do not use drugs. If you are sexually active, practice safe sex. Use a condom or other form of protection to prevent STIs. If you do not wish to become pregnant, use a form of birth control. If you plan to become pregnant, see your health care provider for a prepregnancy visit. Take aspirin only as told by your health care provider. Make sure that you understand how much to take and what form to take. Work with your health care provider to find out whether it is safe and beneficial for you to take aspirin daily. Find healthy ways to manage stress, such as: Meditation, yoga, or listening to music. Journaling. Talking to a trusted person. Spending time with friends and family. Minimize exposure to UV radiation to reduce your risk of skin cancer. Safety Always wear your seat belt while driving or riding in a vehicle. Do not drive: If you have been drinking  alcohol. Do not ride with someone who has been drinking. When you are tired or distracted. While texting. If you have been using any mind-altering substances or drugs. Wear a helmet and other protective equipment during sports activities. If you have firearms in your house, make sure you follow all gun safety procedures. Seek help if you have been physically or sexually abused. What's next? Visit your health care provider once a year for an annual wellness visit. Ask your health care provider how often you should have your eyes and teeth checked. Stay up to date on all vaccines. This information is not intended to replace advice given to you by your health care provider. Make sure you discuss any questions you have with your health care provider. Document Revised: 11/14/2020 Document Reviewed: 11/14/2020 Elsevier Patient Education  2024 [employer].

## 2022-12-15 NOTE — Assessment & Plan Note (Signed)
Blood pressure at goal today on olmesartan 20 mg daily and metoprolol tartrate 50 mg twice daily.

## 2022-12-15 NOTE — Assessment & Plan Note (Signed)
Follows with hepatology at [facility].  Check labs today.

## 2022-12-15 NOTE — Assessment & Plan Note (Signed)
Currently on Mounjaro 10 mg weekly for the last couple weeks.  She is also on metformin 1000 mg twice daily.  She has tolerated this well.  Will check A1c today.  Recheck in 3 to 6 months.

## 2022-12-15 NOTE — Progress Notes (Signed)
Chief Complaint:  Traci Harper is a 67 y.o. female who presents today for her annual comprehensive physical exam.    Assessment/Plan:  Chronic Problems Addressed Today: Type 2 diabetes mellitus with hyperglycemia (HCC) Currently on Mounjaro 10 mg weekly for the last couple weeks.  She is also on metformin 1000 mg twice daily.  She has tolerated this well.  Will check A1c today.  Recheck in 3 to 6 months.  Hypertension associated with diabetes (HCC) Blood pressure at goal today on olmesartan 20 mg daily and metoprolol tartrate 50 mg twice daily.  Hypothyroidism On Synthroid 200 mcg daily.  Check TSH today.  Liver transplanted 2014 [facility]) follows with Karleen Hampshire with hepatology at [facility].  Check labs today.  Dyslipidemia associated with type 2 diabetes mellitus (HCC) Check labs.   Preventative Healthcare: Check labs.  She is due for Pap.  Advised her to follow-up with GYN soon for this.  She deferred shingles vaccine and will get this at the pharmacy.  Patient Counseling(The following topics were reviewed and/or handout was given):  -Nutrition: Stressed importance of moderation in sodium/caffeine intake, saturated fat and cholesterol, caloric balance, sufficient intake of fresh fruits, vegetables, and fiber.  -Stressed the importance of regular exercise.   -Substance Abuse: Discussed cessation/primary prevention of tobacco, alcohol, or other drug use; driving or other dangerous activities under the influence; availability of treatment for abuse.   -Injury prevention: Discussed safety belts, safety helmets, smoke detector, smoking near bedding or upholstery.   -Sexuality: Discussed sexually transmitted diseases, partner selection, use of condoms, avoidance of unintended pregnancy and contraceptive alternatives.   -Dental health: Discussed importance of regular tooth brushing, flossing, and dental visits.  -Health maintenance and immunizations reviewed. Please refer to Health  maintenance section.  Return to care in 1 year for next preventative visit.     Subjective:  HPI:  She has no acute complaints today. See Assessment / plan for status of chronic conditions.  She switch from Trulicity to [facility] since her last visit.  She has been on this for 2 weeks and has done well with this.  Lifestyle Diet: None specific.  Exercise: Going to pool routinely.      12/15/2022   10:09 AM  Depression screen PHQ 2/9  Decreased Interest 0  Down, Depressed, Hopeless 0  PHQ - 2 Score 0  Altered sleeping 1  Tired, decreased energy 1  Change in appetite 1  Feeling bad or failure about yourself  0  Trouble concentrating 0  Moving slowly or fidgety/restless 0  Suicidal thoughts 0  PHQ-9 Score 3  Difficult doing work/chores Not difficult at all    Health Maintenance Due  Topic Date Due   FOOT EXAM  Never done   Diabetic kidney evaluation - Urine ACR  Never done   Zoster Vaccines- Shingrix (1 of 2) Never done   PAP SMEAR-Modifier  05/08/2017     ROS: Per HPI, otherwise a complete review of systems was negative.   PMH:  The following were reviewed and entered/updated in epic: 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 43   Thyroid disease    Patient Active Problem List   Diagnosis Date Noted   Sleep-disordered breathing 06/13/2022   Stress 10/01/2021   Dyslipidemia associated with type 2 diabetes mellitus (HCC) 10/01/2021  Pulmonary embolism (HCC) 04/02/2021   Hypertension associated with diabetes (HCC) 04/23/2020   Type 2 diabetes mellitus with hyperglycemia (HCC) 04/23/2020   Hypothyroidism 04/23/2020   GERD (gastroesophageal reflux disease) 04/23/2020   Liver transplanted 2014 [facility]) follows with [facility] 04/01/2013   Past Surgical History:  Procedure Laterality Date   APPENDECTOMY     BACK SURGERY     BREAST  BIOPSY Right    CESAREAN SECTION     x2   CHOLECYSTECTOMY     DILATION AND CURETTAGE OF UTERUS     at 56   HEMORRHOID SURGERY     HERNIA REPAIR     LIVER TRANSPLANT  09/17/2012   [email or web address]   THYROID SURGERY     x2   TUBAL LIGATION     V-TACH ABLATION      Family History  Problem Relation Age of Onset   Diabetes Mother    Heart disease Father    Diabetes Father    Sleep apnea Son     Medications- reviewed and updated Current Outpatient Medications  Medication Sig Dispense Refill   Blood Glucose Monitoring Suppl (ONETOUCH VERIO FLEX SYSTEM) w/Device KIT USE   TO CHECK GLUCOSE FOR BLOOD SUGAR 1 kit 3   calcium citrate-vitamin D (CITRACAL+D) 315-200 MG-UNIT tablet Take 1 tablet by mouth daily.     ELIQUIS 5 MG TABS tablet Take 1 tablet (5 mg total) by mouth 2 (two) times daily. 180 tablet 3   glucose blood test strip 1 each by Other route as needed for other. Use as instructed 100 each 1   levocetirizine (XYZAL) 5 MG tablet Take 1 tablet (5 mg total) by mouth every evening. 14 tablet 0   levothyroxine (SYNTHROID) 200 MCG tablet Take 1 tablet by mouth once daily 90 tablet 0   MAGNESIUM-OXIDE 400 (240 Mg) MG tablet Take 1 tablet by mouth twice daily 180 tablet 0   metFORMIN (GLUCOPHAGE) 1000 MG tablet TAKE 1 TABLET BY MOUTH TWICE DAILY WITH  A  MEAL 180 tablet 0   metoprolol tartrate (LOPRESSOR) 50 MG tablet Take 1 tablet by mouth twice daily 180 tablet 0   Multiple Vitamin (ONE DAILY) tablet Take 1 tablet by mouth daily.      mycophenolate (MYFORTIC) 360 MG TBEC Take 360 mg by mouth 2 (two) times daily.      olmesartan (BENICAR) 20 MG tablet Take 1 tablet (20 mg total) by mouth daily. 90 tablet 3   omeprazole (PRILOSEC) 20 MG capsule Take 1 capsule by mouth once daily 90 capsule 0   PROGRAF 0.5 MG capsule Take 0.5 mg by mouth 2 (two) times daily.  10   tirzepatide (MOUNJARO) 10 MG/0.5ML Pen Inject 10 mg into the skin once a week. 6 mL 1   No current  facility-administered medications for this visit.    Allergies-reviewed and updated Allergies  Allergen Reactions   Lisinopril Cough    Social History   Socioeconomic History   Marital status: Married    Spouse name: Not on file   Number of children: Not on file   Years of education: Not on file   Highest education level: Not on file  Occupational History   Not on file  Tobacco Use   Smoking status: Former    Current packs/day: 0.00    Types: Cigarettes    Quit date: 06/02/2002    Years since quitting: 20.5   Smokeless tobacco: Not on file  Substance and Sexual Activity   Alcohol  use: No    Comment: No alcohol since 2012   Drug use: No   Sexual activity: Never  Other Topics Concern   Not on file  Social History Narrative   Not on file   Social Determinants of Health   Financial Resource Strain: Not on file  Food Insecurity: No Food Insecurity (03/14/2022)   Hunger Vital Sign    Worried About Running Out of Food in the Last Year: Never true    Ran Out of Food in the Last Year: Never true  Transportation Needs: No Transportation Needs (03/14/2022)   PRAPARE - Administrator, Civil Service (Medical): No    Lack of Transportation (Non-Medical): No  Physical Activity: Not on file  Stress: Not on file  Social Connections: Not on file        Objective:  Physical Exam: BP 120/79   Pulse 84   Temp 98.2 F (36.8 C) (Temporal)   Ht 5\' 2"  (1.575 m)   Wt 232 lb 3.2 oz (105.3 kg)   SpO2 97%   BMI 42.47 kg/m   Body mass index is 42.47 kg/m. Wt Readings from Last 3 Encounters:  12/15/22 232 lb 3.2 oz (105.3 kg)  09/12/22 233 lb 9.6 oz (106 kg)  07/08/22 239 lb 3.2 oz (108.5 kg)   Gen: NAD, resting comfortably HEENT: TMs normal bilaterally. OP clear. No thyromegaly noted.  CV: RRR with no murmurs appreciated Pulm: NWOB, CTAB with no crackles, wheezes, or rhonchi GI: Normal bowel sounds present. Soft, Nontender, Nondistended. MSK: no edema, cyanosis,  or clubbing noted Skin: warm, dry Neuro: CN2-12 grossly intact. Strength 5/5 in upper and lower extremities. Reflexes symmetric and intact bilaterally.  Psych: Normal affect and thought content     Danelia Snodgrass M. [name], MD 12/15/2022 10:46 AM

## 2022-12-15 NOTE — Assessment & Plan Note (Signed)
On Synthroid 200 mcg daily.  Check TSH today.

## 2022-12-15 NOTE — Assessment & Plan Note (Signed)
 Check labs 

## 2022-12-16 NOTE — Progress Notes (Signed)
A1c is 6.6.  He should continue with current dose of Mounjaro at home and we can recheck this in 3 months.  Her cholesterol is elevated.  She would benefit from starting statin to improve her numbers and lower risk of heart attack and stroke. Please send in Lipitor 40 mg daily if she is agreeable to start.  She should come back in 3 months to recheck lipids and CMET if she is agreeable to start medication.  The rest of her labs are all stable and we can recheck in a year.

## 2022-12-24 ENCOUNTER — Other Ambulatory Visit: Payer: Self-pay | Admitting: *Deleted

## 2022-12-24 ENCOUNTER — Other Ambulatory Visit: Payer: Self-pay | Admitting: Family Medicine

## 2022-12-24 DIAGNOSIS — E1165 Type 2 diabetes mellitus with hyperglycemia: Secondary | ICD-10-CM

## 2022-12-24 MED ORDER — ATORVASTATIN CALCIUM 40 MG PO TABS: 40.0000 mg | ORAL_TABLET | Freq: Every day | ORAL | 3 refills | Status: DC

## 2023-01-11 ENCOUNTER — Other Ambulatory Visit: Payer: Self-pay | Admitting: Family Medicine

## 2023-01-16 ENCOUNTER — Other Ambulatory Visit: Payer: Self-pay | Admitting: Family Medicine

## 2023-01-19 ENCOUNTER — Other Ambulatory Visit: Payer: Self-pay | Admitting: Family Medicine

## 2023-03-17 ENCOUNTER — Other Ambulatory Visit: Payer: Self-pay | Admitting: Family Medicine

## 2023-03-19 ENCOUNTER — Other Ambulatory Visit: Payer: Self-pay | Admitting: Family Medicine

## 2023-03-24 ENCOUNTER — Other Ambulatory Visit: Payer: Self-pay | Admitting: *Deleted

## 2023-03-24 ENCOUNTER — Telehealth: Payer: Self-pay | Admitting: Family Medicine

## 2023-03-24 MED ORDER — TIRZEPATIDE 10 MG/0.5ML ~~LOC~~ SOAJ: 10.0000 mg | SUBCUTANEOUS | 1 refills | Status: DC

## 2023-03-24 NOTE — Telephone Encounter (Signed)
Pharmacy called about RX Mounjaro. They need a diagnosis code and a PA. Please advise.

## 2023-03-25 ENCOUNTER — Other Ambulatory Visit: Payer: Self-pay | Admitting: *Deleted

## 2023-03-25 ENCOUNTER — Other Ambulatory Visit (HOSPITAL_COMMUNITY): Payer: Self-pay

## 2023-03-25 MED ORDER — TIRZEPATIDE 10 MG/0.5ML ~~LOC~~ SOAJ: 10.0000 mg | SUBCUTANEOUS | 1 refills | Status: DC

## 2023-03-25 NOTE — Telephone Encounter (Signed)
Rx resend with Dx code

## 2023-03-26 ENCOUNTER — Ambulatory Visit: Payer: Medicare Other | Admitting: Family Medicine

## 2023-03-26 VITALS — BP 138/84 | HR 105 | Temp 97.1°F | Ht 62.0 in | Wt 214.6 lb

## 2023-03-26 DIAGNOSIS — D692 Other nonthrombocytopenic purpura: Secondary | ICD-10-CM | POA: Insufficient documentation

## 2023-03-26 DIAGNOSIS — I152 Hypertension secondary to endocrine disorders: Secondary | ICD-10-CM

## 2023-03-26 DIAGNOSIS — E1140 Type 2 diabetes mellitus with diabetic neuropathy, unspecified: Secondary | ICD-10-CM | POA: Insufficient documentation

## 2023-03-26 DIAGNOSIS — Z23 Encounter for immunization: Secondary | ICD-10-CM

## 2023-03-26 DIAGNOSIS — E1149 Type 2 diabetes mellitus with other diabetic neurological complication: Secondary | ICD-10-CM | POA: Diagnosis not present

## 2023-03-26 DIAGNOSIS — E1165 Type 2 diabetes mellitus with hyperglycemia: Secondary | ICD-10-CM

## 2023-03-26 DIAGNOSIS — E1169 Type 2 diabetes mellitus with other specified complication: Secondary | ICD-10-CM

## 2023-03-26 DIAGNOSIS — E785 Hyperlipidemia, unspecified: Secondary | ICD-10-CM | POA: Diagnosis not present

## 2023-03-26 DIAGNOSIS — E1159 Type 2 diabetes mellitus with other circulatory complications: Secondary | ICD-10-CM

## 2023-03-26 DIAGNOSIS — E039 Hypothyroidism, unspecified: Secondary | ICD-10-CM

## 2023-03-26 DIAGNOSIS — Z944 Liver transplant status: Secondary | ICD-10-CM

## 2023-03-26 DIAGNOSIS — I2699 Other pulmonary embolism without acute cor pulmonale: Secondary | ICD-10-CM

## 2023-03-26 LAB — COMPREHENSIVE METABOLIC PANEL
ALT: 53 U/L — ABNORMAL HIGH (ref 0–35)
AST: 75 U/L — ABNORMAL HIGH (ref 0–37)
Albumin: 3.6 g/dL (ref 3.5–5.2)
Alkaline Phosphatase: 68 U/L (ref 39–117)
BUN: 13 mg/dL (ref 6–23)
CO2: 27 meq/L (ref 19–32)
Calcium: 9.1 mg/dL (ref 8.4–10.5)
Chloride: 100 meq/L (ref 96–112)
Creatinine, Ser: 1.23 mg/dL — ABNORMAL HIGH (ref 0.40–1.20)
GFR: 46.26 mL/min — ABNORMAL LOW (ref >60.00)
Glucose, Bld: 132 mg/dL — ABNORMAL HIGH (ref 70–99)
Potassium: 4.4 meq/L (ref 3.5–5.1)
Sodium: 137 meq/L (ref 135–145)
Total Bilirubin: 0.5 mg/dL (ref 0.2–1.2)
Total Protein: 5.9 g/dL — ABNORMAL LOW (ref 6.0–8.3)

## 2023-03-26 LAB — LIPID PANEL
Cholesterol: 105 mg/dL (ref 0–200)
HDL: 55.7 mg/dL (ref >39.00)
LDL Cholesterol: 28 mg/dL (ref 0–99)
NonHDL: 49.29
Total CHOL/HDL Ratio: 2
Triglycerides: 106 mg/dL (ref 0.0–149.0)
VLDL: 21.2 mg/dL (ref 0.0–40.0)

## 2023-03-26 LAB — VITAMIN B12: Vitamin B-12: 423 pg/mL (ref 211–911)

## 2023-03-26 LAB — TSH: TSH: 0.02 u[IU]/mL — ABNORMAL LOW (ref 0.35–5.50)

## 2023-03-26 LAB — HEMOGLOBIN A1C: Hgb A1c MFr Bld: 6.9 % — ABNORMAL HIGH (ref 4.6–6.5)

## 2023-03-26 NOTE — Assessment & Plan Note (Signed)
Symptoms have worsened over the last few months.  Her A1c has been well-controlled recently -not sure much as is contributing.  Will check B12 level today.  We did discuss trial of gabapentin however she would like to hold off on this for now.  She will let us know if symptoms worsen.

## 2023-03-26 NOTE — Assessment & Plan Note (Signed)
Due to Eliquis and dry skin.  Recommended daily emollient.

## 2023-03-26 NOTE — Assessment & Plan Note (Signed)
Blood pressure at goal today on olmesartan 20 mg daily metoprolol tartrate 50 mg twice daily.

## 2023-03-26 NOTE — Assessment & Plan Note (Signed)
Following with hepatology at [facility].  Will recheck LFTs today as we  started Lipitor about 3 months ago.

## 2023-03-26 NOTE — Assessment & Plan Note (Signed)
She is doing great with Mounjaro 10 mg weekly.  She is down about 18 pounds over the last few months.  She is also on metformin 1000 mg twice daily.  Will recheck A1c today.  She will continue working on lifestyle modifications.  Recheck in 3 to 6 months.

## 2023-03-26 NOTE — Progress Notes (Signed)
   Traci Harper is a 67 y.o. female who presents today for an office visit.  Assessment/Plan:  Chronic Problems Addressed Today: Type 2 diabetes mellitus with hyperglycemia (HCC) She is doing great with Mounjaro 10 mg weekly.  She is down about 18 pounds over the last few months.  She is also on metformin 1000 mg twice daily.  Will recheck A1c today.  She will continue working on lifestyle modifications.  Recheck in 3 to 6 months.  Diabetic neuropathy (HCC) Symptoms have worsened over the last few months.  Her A1c has been well-controlled recently -not sure much as is contributing.  Will check B12 level today.  We did discuss trial of gabapentin however she would like to hold off on this for now.  She will let us know if symptoms worsen.  Dyslipidemia associated with type 2 diabetes mellitus (HCC) She is doing well with Lipitor 40 mg daily.  No significant side effects.  Will recheck lipid panel and c-Met today.  Hypertension associated with diabetes (HCC) Blood pressure at goal today on olmesartan 20 mg daily metoprolol tartrate 50 mg twice daily.  Liver transplanted 2014 [facility]) follows with [facility] Following with hepatology at [facility].  Will recheck LFTs today as we  started Lipitor about 3 months ago.  Hypothyroidism Check TSH.  She is on Synthroid 200 mcg daily.  Pulmonary embolism (HCC) Stable on Eliquis 5 mg twice daily.  She is having a little bit more bruising and bleeding from her skin.   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  Recommended daily emollient.  Preventative health care Pneumonia vaccine given today.    Subjective:  HPI:  See Assessment / plan for status of chronic conditions.  Patient is here today for follow-up.  She was last here 3 months ago.  At that time A1c was 6.6.  We continued her on Mounjaro.  She was also found to have elevated cholesterol and was started Lipitor 40 mg daily.  She is getting more pain in her feet from her neuropathy.  This  is been going on for several years.  She was diagnosed with diabetic neuropathy in the past.  Symptoms do seem to be worsening over the last few months.  No obvious injuries or precipitating events.       Objective:  Physical Exam: BP 138/84   Pulse (!) 105   Temp (!) 97.1 F (36.2 C) (Temporal)   Ht 5\' 2"  (1.575 m)   Wt 214 lb 9.6 oz (97.3 kg)   SpO2 96%   BMI 39.25 kg/m   Wt Readings from Last 3 Encounters:  03/26/23 214 lb 9.6 oz (97.3 kg)  12/15/22 232 lb 3.2 oz (105.3 kg)  09/12/22 233 lb 9.6 oz (106 kg)    Gen: No acute distress, resting comfortably CV: Regular rate and rhythm with no murmurs appreciated Pulm: Normal work of breathing, clear to auscultation bilaterally with no crackles, wheezes, or rhonchi Neuro: Grossly normal, moves all extremities Psych: Normal affect and thought content      Traci Harper M. [name], MD 03/26/2023 10:11 AM

## 2023-03-26 NOTE — Assessment & Plan Note (Signed)
Check TSH.  She is on Synthroid 200 mcg daily.

## 2023-03-26 NOTE — Assessment & Plan Note (Signed)
Stable on Eliquis 5 mg twice daily.  She is having a little bit more bruising and bleeding from her skin.

## 2023-03-26 NOTE — Assessment & Plan Note (Signed)
She is doing well with Lipitor 40 mg daily.  No significant side effects.  Will recheck lipid panel and c-Met today.

## 2023-03-26 NOTE — Patient Instructions (Signed)
It was very nice to see you today!  We will check blood work.  We will check B12 make sure this is not contributing to neuropathy.  Please let us know if you change your mind about starting gabapentin.  Please continue to work on diet and exercise.  Return in about 3 months (around 06/26/2023) for Follow Up.   Take care, Dr [name]  PLEASE NOTE:  If you had any lab tests, please let us know if you have not heard back within a few days. You may see your results on mychart before we have a chance to review them but we will give you a call once they are reviewed by [place].   If we ordered any referrals today, please let us know if you have not heard from their office within the next week.   If you had any urgent prescriptions sent in today, please check with the pharmacy within an hour of our visit to make sure the prescription was transmitted appropriately.   Please try these tips to maintain a healthy lifestyle:  Eat at least 3 REAL meals and 1-2 snacks per day.  Aim for no more than 5 hours between eating.  If you eat breakfast, please do so within one hour of getting up.   Each meal should contain half fruits/vegetables, one quarter protein, and one quarter carbs (no bigger than a computer mouse)  Cut down on sweet beverages. This includes juice, soda, and sweet tea.   Drink at least 1 glass of water with each meal and aim for at least 8 glasses per day  Exercise at least 150 minutes every week.

## 2023-03-30 NOTE — Progress Notes (Signed)
A1c is up a little bit since last time but still at goal.  She can continue her current medications and we can recheck in 3 months.  Her liver numbers are about the same as last time.  It does look like she is getting a little bit too much thyroid.  Please make sure that she has been taking her medication as prescribed.  If she is taking as prescribed recommend we decrease to 175 mcg daily and recheck in 4 to 6 weeks.  Please send new prescription if needed.  We can recheck everything else in a year.

## 2023-04-01 ENCOUNTER — Other Ambulatory Visit: Payer: Self-pay | Admitting: *Deleted

## 2023-04-01 DIAGNOSIS — E039 Hypothyroidism, unspecified: Secondary | ICD-10-CM

## 2023-04-01 MED ORDER — LEVOTHYROXINE SODIUM 175 MCG PO TABS: 175.0000 ug | ORAL_TABLET | Freq: Every day | ORAL | 3 refills | Status: DC

## 2023-04-08 ENCOUNTER — Other Ambulatory Visit: Payer: Self-pay | Admitting: Family Medicine

## 2023-04-14 ENCOUNTER — Other Ambulatory Visit: Payer: Self-pay | Admitting: Family Medicine

## 2023-05-08 ENCOUNTER — Other Ambulatory Visit (INDEPENDENT_AMBULATORY_CARE_PROVIDER_SITE_OTHER): Payer: Medicare Other

## 2023-05-08 DIAGNOSIS — E039 Hypothyroidism, unspecified: Secondary | ICD-10-CM | POA: Diagnosis not present

## 2023-05-08 LAB — TSH: TSH: 0.41 u[IU]/mL (ref 0.35–5.50)

## 2023-05-11 NOTE — Progress Notes (Signed)
Her A1c is back at goal.  Recommend she continue taking her current dose of levothyroxine and we can recheck in 6 months.

## 2023-05-29 ENCOUNTER — Other Ambulatory Visit: Payer: Self-pay | Admitting: Family Medicine

## 2023-06-10 ENCOUNTER — Other Ambulatory Visit: Payer: Self-pay | Admitting: *Deleted

## 2023-06-10 ENCOUNTER — Other Ambulatory Visit: Payer: Self-pay | Admitting: Family Medicine

## 2023-06-10 MED ORDER — TIRZEPATIDE 10 MG/0.5ML ~~LOC~~ SOAJ: 10.0000 mg | SUBCUTANEOUS | 1 refills | Status: DC

## 2023-06-26 ENCOUNTER — Encounter: Payer: Self-pay | Admitting: Family Medicine

## 2023-06-26 ENCOUNTER — Telehealth: Payer: Self-pay | Admitting: *Deleted

## 2023-06-26 ENCOUNTER — Ambulatory Visit (INDEPENDENT_AMBULATORY_CARE_PROVIDER_SITE_OTHER): Payer: Medicare Other | Admitting: Family Medicine

## 2023-06-26 VITALS — BP 147/74 | HR 90 | Temp 97.7°F | Ht 62.0 in | Wt 207.8 lb

## 2023-06-26 DIAGNOSIS — E1159 Type 2 diabetes mellitus with other circulatory complications: Secondary | ICD-10-CM

## 2023-06-26 DIAGNOSIS — Z7984 Long term (current) use of oral hypoglycemic drugs: Secondary | ICD-10-CM

## 2023-06-26 DIAGNOSIS — Z7985 Long-term (current) use of injectable non-insulin antidiabetic drugs: Secondary | ICD-10-CM

## 2023-06-26 DIAGNOSIS — E1165 Type 2 diabetes mellitus with hyperglycemia: Secondary | ICD-10-CM | POA: Diagnosis not present

## 2023-06-26 DIAGNOSIS — E1149 Type 2 diabetes mellitus with other diabetic neurological complication: Secondary | ICD-10-CM

## 2023-06-26 DIAGNOSIS — I152 Hypertension secondary to endocrine disorders: Secondary | ICD-10-CM

## 2023-06-26 LAB — MICROALBUMIN / CREATININE URINE RATIO
Creatinine,U: 172.5 mg/dL
Microalb Creat Ratio: 10.8 mg/g (ref 0.0–30.0)
Microalb, Ur: 18.7 mg/dL — ABNORMAL HIGH (ref 0.0–1.9)

## 2023-06-26 LAB — POCT GLYCOSYLATED HEMOGLOBIN (HGB A1C): Hemoglobin A1C: 5.3 % (ref 4.0–5.6)

## 2023-06-26 MED ORDER — METFORMIN HCL 1000 MG PO TABS: 1000.0000 mg | ORAL_TABLET | Freq: Every day | ORAL | Status: DC

## 2023-06-26 NOTE — Assessment & Plan Note (Signed)
A1c very well-controlled at 5.3.  Will continue Mounjaro 10 mg weekly.  She is tolerating well.  We will decrease metformin to 1000 mg daily.  She will continue to work on lifestyle interventions.  Recheck A1c in 6 months.

## 2023-06-26 NOTE — Assessment & Plan Note (Signed)
Still having persistent symptoms though overall manageable.  We did discuss starting gabapentin however she declined.  She will let us know if she has any change in symptoms between now and her next visit.

## 2023-06-26 NOTE — Telephone Encounter (Signed)
Traci Harper (Key: [place]) - [id number] Traci Harper 10MG /0.5ML auto-injectors status: PA RequestCreated: January 23rd, 2025 [id number] Sent: January 24th, 2025 Waiting for determination

## 2023-06-26 NOTE — Assessment & Plan Note (Signed)
Elevated today.  Typically well-controlled.  Will continue metoprolol tartrate 50 mg twice daily and Benicar 5 mg daily.  She will monitor at home and let us know if persistently elevated.

## 2023-06-26 NOTE — Progress Notes (Signed)
   Traci Harper is a 68 y.o. female who presents today for an office visit.  Assessment/Plan:  Chronic Problems Addressed Today: Type 2 diabetes mellitus with hyperglycemia (HCC) A1c very well-controlled at 5.3.  Will continue Mounjaro 10 mg weekly.  She is tolerating well.  We will decrease metformin to 1000 mg daily.  She will continue to work on lifestyle interventions.  Recheck A1c in 6 months.  Diabetic neuropathy (HCC) Still having persistent symptoms though overall manageable.  We did discuss starting gabapentin however she declined.  She will let us know if she has any change in symptoms between now and her next visit.  Hypertension associated with diabetes (HCC) Elevated today.  Typically well-controlled.  Will continue metoprolol tartrate 50 mg twice daily and Benicar 5 mg daily.  She will monitor at home and let us know if persistently elevated.     Subjective:  HPI:  See Assessment / plan for status of chronic conditions.  Patient is here today for follow-up.Last saw her 3 months ago for annual visit.  At that time she was on Mounjaro 2 mg weekly and metformin 1000 mg twice daily.  She has done well with this.  She is working on lifestyle interventions.  Does not get much exercise.       Objective:  Physical Exam: BP (!) 147/74   Pulse 90   Temp 97.7 F (36.5 C) (Temporal)   Ht 5\' 2"  (1.575 m)   Wt 207 lb 12.8 oz (94.3 kg)   SpO2 96%   BMI 38.01 kg/m   Wt Readings from Last 3 Encounters:  06/26/23 207 lb 12.8 oz (94.3 kg)  03/26/23 214 lb 9.6 oz (97.3 kg)  12/15/22 232 lb 3.2 oz (105.3 kg)    Gen: No acute distress, resting comfortably CV: Regular rate and rhythm with no murmurs appreciated Pulm: Normal work of breathing, clear to auscultation bilaterally with no crackles, wheezes, or rhonchi Neuro: Grossly normal, moves all extremities Psych: Normal affect and thought content      Leshawn Straka M. [name], MD 06/26/2023 9:53 AM

## 2023-06-26 NOTE — Patient Instructions (Signed)
It was very nice to see you today!  Your A1c is great!  Please decrease your metformin to 1000 mg daily.  Keep an eye on your blood pressure and let us know if it is persistently elevated at home.  Return in about 6 months (around 12/24/2023) for Follow Up.   Take care, Dr [name]  PLEASE NOTE:  If you had any lab tests, please let us know if you have not heard back within a few days. You may see your results on mychart before we have a chance to review them but we will give you a call once they are reviewed by [place].   If we ordered any referrals today, please let us know if you have not heard from their office within the next week.   If you had any urgent prescriptions sent in today, please check with the pharmacy within an hour of our visit to make sure the prescription was transmitted appropriately.   Please try these tips to maintain a healthy lifestyle:  Eat at least 3 REAL meals and 1-2 snacks per day.  Aim for no more than 5 hours between eating.  If you eat breakfast, please do so within one hour of getting up.   Each meal should contain half fruits/vegetables, one quarter protein, and one quarter carbs (no bigger than a computer mouse)  Cut down on sweet beverages. This includes juice, soda, and sweet tea.   Drink at least 1 glass of water with each meal and aim for at least 8 glasses per day  Exercise at least 150 minutes every week.

## 2023-06-29 ENCOUNTER — Telehealth: Payer: Self-pay | Admitting: Pharmacist

## 2023-06-29 NOTE — Telephone Encounter (Signed)
PA has been submitted and documented in separate encounter

## 2023-06-29 NOTE — Telephone Encounter (Signed)
Additional information has been requested from the patient's insurance in order to proceed with the prior authorization request. Requested information has been sent, or form has been filled out and faxed back to [phone]

## 2023-06-30 ENCOUNTER — Encounter: Payer: Self-pay | Admitting: Family Medicine

## 2023-06-30 NOTE — Progress Notes (Signed)
Urine sample is normal.  We can recheck in a year.

## 2023-06-30 NOTE — Telephone Encounter (Signed)
This got denied because we didn't submit supporting documentation that she has type 2 diabetes. Please do an appeal for this.

## 2023-07-01 ENCOUNTER — Telehealth: Payer: Self-pay | Admitting: Family Medicine

## 2023-07-01 NOTE — Telephone Encounter (Signed)
Please advise

## 2023-07-01 NOTE — Telephone Encounter (Signed)
Copied from CRM [id number]. Topic: Clinical - Medication Question >> Jul 01, 2023 10:20 AM Louie Casa B wrote: Reason for CRM: [facility] is calling in they are changing manufactures and wants to make sure that is ok with the patients levothyroxine (SYNTHROID) 175 MCG [phone]

## 2023-07-02 ENCOUNTER — Other Ambulatory Visit: Payer: Self-pay | Admitting: *Deleted

## 2023-07-02 DIAGNOSIS — E039 Hypothyroidism, unspecified: Secondary | ICD-10-CM

## 2023-07-02 NOTE — Telephone Encounter (Signed)
This is not ideal but if there is no way for her to maintain current manufacturer then I would like for him to come here in 4-6 weeks to check her TSH.  Katina Degree. [name], MD 07/02/2023 11:56 AM

## 2023-07-02 NOTE — Telephone Encounter (Signed)
Pharmacy noticed   Patient aware need to return for lab work 4-6 weeks Future lab order

## 2023-07-04 ENCOUNTER — Other Ambulatory Visit: Payer: Self-pay | Admitting: Family Medicine

## 2023-07-07 ENCOUNTER — Other Ambulatory Visit (HOSPITAL_COMMUNITY): Payer: Self-pay

## 2023-07-08 LAB — HM DIABETES EYE EXAM

## 2023-07-10 ENCOUNTER — Other Ambulatory Visit: Payer: Self-pay | Admitting: Family Medicine

## 2023-07-11 ENCOUNTER — Other Ambulatory Visit: Payer: Self-pay | Admitting: Family Medicine

## 2023-07-15 ENCOUNTER — Other Ambulatory Visit (HOSPITAL_COMMUNITY): Payer: Self-pay

## 2023-08-25 ENCOUNTER — Other Ambulatory Visit: Payer: Self-pay | Admitting: Family Medicine

## 2023-08-27 ENCOUNTER — Other Ambulatory Visit: Payer: Self-pay | Admitting: *Deleted

## 2023-08-27 ENCOUNTER — Encounter: Payer: Self-pay | Admitting: Family Medicine

## 2023-08-27 MED ORDER — METFORMIN HCL 1000 MG PO TABS: 1000.0000 mg | ORAL_TABLET | Freq: Every day | ORAL | 1 refills | Status: AC

## 2023-09-02 ENCOUNTER — Other Ambulatory Visit: Payer: Self-pay | Admitting: Family Medicine

## 2023-09-23 ENCOUNTER — Encounter (HOSPITAL_COMMUNITY): Payer: Self-pay

## 2023-09-23 ENCOUNTER — Telehealth: Payer: Self-pay | Admitting: *Deleted

## 2023-09-23 ENCOUNTER — Other Ambulatory Visit: Payer: Self-pay

## 2023-09-23 ENCOUNTER — Emergency Department (HOSPITAL_COMMUNITY)

## 2023-09-23 ENCOUNTER — Emergency Department (HOSPITAL_COMMUNITY)
Admission: EM | Admit: 2023-09-23 | Discharge: 2023-09-23 | Disposition: A | Discharge status: Home / Self Care | Attending: Emergency Medicine | Admitting: Emergency Medicine

## 2023-09-23 DIAGNOSIS — S50312A Abrasion of left elbow, initial encounter: Secondary | ICD-10-CM | POA: Insufficient documentation

## 2023-09-23 DIAGNOSIS — S8001XA Contusion of right knee, initial encounter: Secondary | ICD-10-CM | POA: Diagnosis not present

## 2023-09-23 DIAGNOSIS — Z7901 Long term (current) use of anticoagulants: Secondary | ICD-10-CM | POA: Insufficient documentation

## 2023-09-23 DIAGNOSIS — T148XXA Other injury of unspecified body region, initial encounter: Secondary | ICD-10-CM

## 2023-09-23 DIAGNOSIS — M25561 Pain in right knee: Secondary | ICD-10-CM | POA: Diagnosis present

## 2023-09-23 DIAGNOSIS — W010XXA Fall on same level from slipping, tripping and stumbling without subsequent striking against object, initial encounter: Secondary | ICD-10-CM | POA: Insufficient documentation

## 2023-09-23 MED ORDER — OXYCODONE-ACETAMINOPHEN 5-325 MG PO TABS: 1.0000 | ORAL_TABLET | Freq: Four times a day (QID) | ORAL | 0 refills | Status: DC | PRN

## 2023-09-23 MED ORDER — BACITRACIN ZINC 500 UNIT/GM EX OINT
TOPICAL_OINTMENT | Freq: Once | CUTANEOUS | Status: AC
  Administered 2023-09-23: 1 via TOPICAL

## 2023-09-23 MED ORDER — OXYCODONE-ACETAMINOPHEN 5-325 MG PO TABS
1.0000 | ORAL_TABLET | Freq: Once | ORAL | Status: AC
  Administered 2023-09-23: 1 via ORAL
  Filled 2023-09-23: qty 1

## 2023-09-23 MED ORDER — OXYCODONE-ACETAMINOPHEN 5-325 MG PO TABS
1.0000 | ORAL_TABLET | ORAL | Status: DC | PRN
  Administered 2023-09-23: 1 via ORAL
  Filled 2023-09-23: qty 1

## 2023-09-23 MED ORDER — BACITRACIN ZINC 500 UNIT/GM EX OINT
TOPICAL_OINTMENT | Freq: Two times a day (BID) | CUTANEOUS | Status: DC
Start: 2023-09-23 — End: 2023-09-23
  Filled 2023-09-23: qty 0.9

## 2023-09-23 NOTE — ED Provider Triage Note (Signed)
 Emergency Medicine Provider Triage Evaluation Note  Traci Harper , a 68 y.o. female  was evaluated in triage.  Pt complains of fall.  Fall was 24 hours ago.  Landed on her right knee.  Has significant swelling.  Review of Systems  Positive: Knee pain, hematoma Negative: Head injury  Physical Exam  BP (!) 143/100 (BP Location: Right Arm)   Pulse 89   Temp 98 F (36.7 C) (Oral)   Resp 18   Ht 5\' 2"  (1.575 m)   Wt 89.8 kg   SpO2 97%   BMI 36.21 kg/m  Gen:   Awake, no distress   Resp:  Normal effort  MSK:   Moves extremities without difficulty  Other:  Large hematoma to right knee  Medical Decision Making  Medically screening exam initiated at 4:09 AM.  Appropriate orders placed.  Traci Harper was informed that the remainder of the evaluation will be completed by another provider, this initial triage assessment does not replace that evaluation, and the importance of remaining in the ED until their evaluation is complete.     Sherel Dikes, PA-C 09/23/23 [phone]

## 2023-09-23 NOTE — ED Triage Notes (Signed)
 Pt had mechanical fall tripping over cat at home 24 hours ago and injured right knee. Pt able to ambulate on it throughout the day but reports swelling and pain increased through the night. Significant swelling and bruising noted to right knee in triage. Pt on eloquis. Denies hitting head. No other complaints.

## 2023-09-23 NOTE — ED Provider Notes (Signed)
 [facility] Emergency Department Provider Note MRN:  [id number]  Arrival date & time: 09/23/23     Chief Complaint   Fall   History of Present Illness   Traci Harper is a 68 y.o. year-old female presents to the ED with chief complaint of right knee pain and swelling.  She states that she had a ground-level mechanical fall about 24 hours ago.  She landed on her right knee.  She also sustained an abrasion to her left elbow.  She did not hit her head.  She is anticoagulated.  She states that she had been walking around fine on her leg earlier today, but then this evening she noticed significant swelling.  She has tenderness to palpation.  She has tried using ice with little relief..  History provided by patient.   Review of Systems  Pertinent positive and negative review of systems noted in HPI.    Physical Exam   Vitals:   09/23/23 0332  BP: (!) 143/100  Pulse: 89  Resp: 18  Temp: 98 F (36.7 C)  SpO2: 97%    CONSTITUTIONAL:  non toxic-appearing, NAD NEURO:  Alert and oriented x 3, CN 3-12 grossly intact EYES:  eyes equal and reactive ENT/NECK:  Supple, no stridor  CARDIO:  normal rate, appears well-perfused  PULM:  No respiratory distress GI/GU:  non-distended,  MSK/SPINE: Large right anterior knee hematoma, tender to palpation, range of motion slightly reduced secondary to pain SKIN:  no rash, abrasion to left elbow   *Additional and/or pertinent findings included in MDM below  Diagnostic and Interventional Summary    EKG Interpretation Date/Time:    Ventricular Rate:    PR Interval:    QRS Duration:    QT Interval:    QTC Calculation:   R Axis:      Text Interpretation:         Labs Reviewed - No data to display  CT Knee Right Wo Contrast  Final Result    DG Knee Complete 4 Views Right  Final Result      Medications  oxyCODONE -acetaminophen  (PERCOCET/ROXICET) 5-325 MG per tablet 1 tablet (1 tablet Oral Given 09/23/23  0338)  bacitracin  ointment (has no administration in time range)     Procedures  /  Critical Care Procedures  ED Course and Medical Decision Making  I have reviewed the triage vital signs, the nursing notes, and pertinent available records from the EMR.  Social Determinants Affecting Complexity of Care: Patient has no clinically significant social determinants affecting this chief complaint..   ED Course:    Medical Decision Making Patient presents with injury to right knee.  DDx includes, fracture, strain, or sprain.  Consultants: none  Plain films reveal no fracture, but large hematoma.  Pt advised to follow up with PCP and/or orthopedics. Patient given ace wrap while in ED, conservative therapy such as RICE recommended and discussed.   Patient will be discharged home & is agreeable with above plan. Returns precautions discussed. Pt appears safe for discharge.   Amount and/or Complexity of Data Reviewed Radiology: ordered.  Risk OTC drugs. Prescription drug management.         Consultants: No consultations were needed in caring for this patient.   Treatment and Plan: Emergency department workup does not suggest an emergent condition requiring admission or immediate intervention beyond  what has been performed at this time. The patient is safe for discharge and has  been instructed to return immediately for worsening symptoms,  change in  symptoms or any other concerns  Patient discussed with attending physician, Dr. Alison Irvine, who agrees with plan for CT.  Final Clinical Impressions(s) / ED Diagnoses     ICD-10-CM   1. Hematoma  T14.Ashten.Benders       ED Discharge Orders          Ordered    oxyCODONE -acetaminophen  (PERCOCET/ROXICET) 5-325 MG tablet  Every 6 hours PRN        09/23/23 0544              Discharge Instructions Discussed with and Provided to Patient:   Discharge Instructions   None      Sherel Dikes, PA-C 09/23/23 0551     Earma Gloss, MD 09/23/23 [phone]

## 2023-09-23 NOTE — ED Notes (Signed)
 Previous RN gave pt d/c instructions and education. Wheelchair provided for pt and husband picked pt up from the front of the ED

## 2023-09-23 NOTE — Telephone Encounter (Signed)
 Pharmacy called related to Rx: percocet dosing range .Aaron AasAaron AasEDCM clarified with EDP  to change Rx to: 1 tablet every 6 hours.    Pharmacy called regarding diagnosis for pt prescribed percocet.  RNCM reviewed the chart to find pt dx was mechanical fall.  Maridel Pixler J. Rachel Budds, RN, BSN, NCM  Transitions of Care  Nurse Case Manager  [facility] Emergency Departments  Operative Services  [phone]

## 2023-09-23 NOTE — Telephone Encounter (Signed)
 Copied from CRM [id number]. Topic: Referral - Request for Referral >> Sep 23, 2023  3:19 PM Caliyah H wrote: Did the patient discuss referral with their provider in the last year? No (If No - schedule appointment) (If Yes - send message)  Appointment offered? No  Type of order/referral and detailed reason for visit: Physical Therapy   Preference of office, provider, location: [facility] [address] [place],  [place]  [address] Get Driving Directions Main: [phone] Fax: [phone]  If referral order, have you been seen by this specialty before? No (If Yes, this issue or another issue? When? Where?  Can we respond through MyChart? Yes

## 2023-09-24 ENCOUNTER — Other Ambulatory Visit: Payer: Self-pay | Admitting: *Deleted

## 2023-09-24 DIAGNOSIS — E1149 Type 2 diabetes mellitus with other diabetic neurological complication: Secondary | ICD-10-CM

## 2023-09-24 NOTE — Telephone Encounter (Signed)
 Referral placed.

## 2023-09-24 NOTE — Telephone Encounter (Signed)
 Ok with me. Please place any necessary orders.

## 2023-09-25 ENCOUNTER — Ambulatory Visit: Payer: Self-pay

## 2023-09-25 NOTE — Telephone Encounter (Signed)
 Chief Complaint: medication refill/knee pain Symptoms: right knee pain Frequency: injured after a fall on Tuesday, intermittent pain worse with movement Pertinent Negatives: Patient denies chest pain, difficulty breathing, fever. Disposition: [] ED /[] Urgent Care (no appt availability in office) / [] Appointment(In office/virtual)/ []  [facility]/ [] Home Care/ [] Refused Recommended Disposition /[] [facility]/ [x]  Follow-up with PCP Additional Notes: Patient calling in stating she was prescribed Percocet from ED. She has 3 pills left and states that it will not last her until May 1st when she has her HFU with her PCP Dr Daneil Dunker. Patient states she is able to ambulate around her house with a walker but that movement causes 7/10 pain even when she takes the percocet. No available video or in person visits available today. Advised patient that mediation refills can take up to 3 days for medications prescribed by PCP and this is a medication not prescribed by her PCP. Called [facility] and informed staff of patient's request.  Copied from CRM [id number]. Topic: Clinical - Medication Question >> Sep 25, 2023  3:02 PM Ovid Blow wrote: Reason for CRM: Patient stated that she has an appt. On May 1 but she is having pain in right knee after fall. Knee is swollen really bad and patient is in severe pain. Went to ER and they gave her Oxycodone  for pain but patient will not have enough for over the weekend or until she sees Dr. Daneil Dunker. Patient is wanting a prescription for Oxycodone  or any pain reliever Reason for Disposition  [1] MODERATE pain (e.g., interferes with normal activities, limping) AND [2] present > 3 days  Answer Assessment - Initial Assessment Questions 1. LOCATION and RADIATION: "Where is the pain located?"      Right knee.  2. QUALITY: "What does the pain feel like?"  (e.g., sharp, dull, aching, burning)     Sore and intermittent stabbing.  3. SEVERITY: "How bad is the pain?" "What  does it keep you from doing?"   (Scale 1-10; or mild, moderate, severe)   -  MILD (1-3): doesn't interfere with normal activities    -  MODERATE (4-7): interferes with normal activities (e.g., work or school) or awakens from sleep, limping    -  SEVERE (8-10): excruciating pain, unable to do any normal activities, unable to walk     Denies any pain at this time right now while sitting and resting. She states when she moves the pain can get up to a 7/10.   4. ONSET: "When did the pain start?" "Does it come and go, or is it there all the time?"     Early Tuesday morning, middle of the night.  5. RECURRENT: "Have you had this pain before?" If Yes, ask: "When, and what happened then?"     Denies.  6. SETTING: "Has there been any recent work, exercise or other activity that involved that part of the body?"      Recent injury from fall.  7. AGGRAVATING FACTORS: "What makes the knee pain worse?" (e.g., walking, climbing stairs, running)     Bending, walking.  8. ASSOCIATED SYMPTOMS: "Is there any swelling or redness of the knee?"     Swelling and bruising.  9. OTHER SYMPTOMS: "Do you have any other symptoms?" (e.g., chest pain, difficulty breathing, fever, calf pain)     Denies.  10. PREGNANCY: "Is there any chance you are pregnant?" "When was your last menstrual period?"       N/A.  Protocols used: Knee Pain-A-AH

## 2023-09-28 ENCOUNTER — Encounter: Payer: Self-pay | Admitting: Family Medicine

## 2023-09-28 NOTE — Telephone Encounter (Signed)
 Patient schedule an OV hospital F/U tomorrow 09/29/2023

## 2023-09-29 ENCOUNTER — Encounter: Payer: Self-pay | Admitting: Family Medicine

## 2023-09-29 ENCOUNTER — Ambulatory Visit (INDEPENDENT_AMBULATORY_CARE_PROVIDER_SITE_OTHER): Admitting: Family Medicine

## 2023-09-29 VITALS — BP 130/77 | HR 90 | Temp 97.3°F | Ht 62.0 in | Wt 194.6 lb

## 2023-09-29 DIAGNOSIS — S8001XA Contusion of right knee, initial encounter: Secondary | ICD-10-CM

## 2023-09-29 DIAGNOSIS — E1159 Type 2 diabetes mellitus with other circulatory complications: Secondary | ICD-10-CM | POA: Diagnosis not present

## 2023-09-29 DIAGNOSIS — I152 Hypertension secondary to endocrine disorders: Secondary | ICD-10-CM

## 2023-09-29 DIAGNOSIS — I2699 Other pulmonary embolism without acute cor pulmonale: Secondary | ICD-10-CM | POA: Diagnosis not present

## 2023-09-29 MED ORDER — OXYCODONE-ACETAMINOPHEN 5-325 MG PO TABS: 1.0000 | ORAL_TABLET | Freq: Four times a day (QID) | ORAL | 0 refills | Status: DC | PRN

## 2023-09-29 NOTE — Assessment & Plan Note (Addendum)
 Anticoagulated on Eliquis  5 mg twice daily.  If continues to have frequent falls or complications of bleeding such as her current hematoma would consider having her follow back up with hematology to discuss alternatives to indefinite anticoagulation.

## 2023-09-29 NOTE — Patient Instructions (Signed)
 It was very nice to see you today!  The hematoma in your knee should continue to improve.  Try to keep your leg elevated.  Continue with compression and hot and cold compresses.  I will refill your pain medication.  Let me know if your symptoms do not continue to improve over the next few weeks.  Return if symptoms worsen or fail to improve.   Take care, Dr Daneil Dunker  PLEASE NOTE:  If you had any lab tests, please let us  know if you have not heard back within a few days. You may see your results on mychart before we have a chance to review them but we will give you a call once they are reviewed by us .   If we ordered any referrals today, please let us  know if you have not heard from their office within the next week.   If you had any urgent prescriptions sent in today, please check with the pharmacy within an hour of our visit to make sure the prescription was transmitted appropriately.   Please try these tips to maintain a healthy lifestyle:  Eat at least 3 REAL meals and 1-2 snacks per day.  Aim for no more than 5 hours between eating.  If you eat breakfast, please do so within one hour of getting up.   Each meal should contain half fruits/vegetables, one quarter protein, and one quarter carbs (no bigger than a computer mouse)  Cut down on sweet beverages. This includes juice, soda, and sweet tea.   Drink at least 1 glass of water with each meal and aim for at least 8 glasses per day  Exercise at least 150 minutes every week.

## 2023-09-29 NOTE — Progress Notes (Signed)
   Traci Harper is a 68 y.o. female who presents today for an office visit.  Assessment/Plan:  New/Acute Problems: Right knee pain/hematoma She has had modest improvement over the last week or so.  Workup in the ED was negative for fracture though did confirm hematoma.  Overall reassuring exam today without any signs of neurovascular compromise or compartment syndrome.  Discussed with patient that we will probably take several weeks for the hematoma to fully resolve.  Did discuss that she will likely have extensive ecchymosis and skin discoloration as this continues to resolve.  She will continue with conservative measures including compression and alternating heat/cold.  We will refill her oxycodone  today as well.  She has tolerated well without any significant side effects.  She is aware that this is not to be a long-term prescription.  Anticipate that her symptoms will continue to improve over the next several weeks.  We discussed reasons to return to care and seek emergent care.  Chronic Problems Addressed Today: Hypertension associated with diabetes (HCC) At goal today on metoprolol  tartrate 50 mg twice daily and Benicar  5 mg daily.  Pulmonary embolism (HCC) Anticoagulated on Eliquis  5 mg twice daily.  If continues to have frequent falls or complications of bleeding such as her current hematoma would consider having her follow back up with hematology to discuss alternatives to indefinite anticoagulation.     Subjective:  HPI:  See A/P for status of chronic conditions.  Patient is here today for ED follow-up.  Went to the ED on 09/23/2023 with right knee pain after falling at home after tripping on her cat.  Had significant right knee pain and swelling at that time and had difficulty with ambulation.  Also had an abrasion on her left elbow.  In the Emergency Department she had imaging including x-ray and CT scan which did not show any fracture but did show large hematoma on her right knee.   She was placed into an Ace wrap and discharged home with oxycodone .  S  he has noticed some improvement over the last week or so.  Still having a lot of pain and tightness in her right knee.  She has neuropathy at baseline but has not noticed any new weakness or numbness.  No tingling.  The medication has helped with her pain and ability to perform activities of daily living at home.  She has not noticed any significant side effects.       Objective:  Physical Exam: BP 130/77   Pulse 90   Temp (!) 97.3 F (36.3 C) (Temporal)   Ht 5\' 2"  (1.575 m)   Wt 194 lb 9.6 oz (88.3 kg)   SpO2 99%   BMI 35.59 kg/m   Gen: No acute distress, resting comfortably CV: Regular rate and rhythm with no murmurs appreciated Pulm: Normal work of breathing, clear to auscultation bilaterally with no crackles, wheezes, or rhonchi MUSCULOSKELETAL - Right Knee: Large hematoma on the right knee with surrounding ecchymosis.  Neurovascular intact distally. Neuro: Grossly normal, moves all extremities Psych: Normal affect and thought content  Time Spent: 40 minutes of total time was spent on the date of the encounter performing the following actions: chart review prior to seeing the patient including recent Emergency Department visit, obtaining history, performing a medically necessary exam, counseling on the treatment plan, placing orders, and documenting in our EHR.        Jinny Mounts. Daneil Dunker, MD 09/29/2023 2:25 PM

## 2023-09-29 NOTE — Assessment & Plan Note (Signed)
 At goal today on metoprolol  tartrate 50 mg twice daily and Benicar  5 mg daily.

## 2023-10-01 ENCOUNTER — Inpatient Hospital Stay: Admitting: Family Medicine

## 2023-10-03 ENCOUNTER — Other Ambulatory Visit: Payer: Self-pay | Admitting: Family Medicine

## 2023-10-05 ENCOUNTER — Inpatient Hospital Stay: Admitting: Family Medicine

## 2023-10-11 ENCOUNTER — Other Ambulatory Visit: Payer: Self-pay | Admitting: Family Medicine

## 2023-10-12 ENCOUNTER — Telehealth: Payer: Self-pay | Admitting: *Deleted

## 2023-10-12 ENCOUNTER — Encounter: Payer: Self-pay | Admitting: Family Medicine

## 2023-10-12 NOTE — Telephone Encounter (Signed)
 Copied from CRM [id number]. Topic: Clinical - Prescription Issue >> Oct 12, 2023 12:45 PM Kita Perish H wrote: Reason for CRM: Edward Graff with [facility] calling to state that patients insurance will not cover the omeprazole  (PRILOSEC) 20 MG extended release capsules, but they may cover the omeprazole  (PRILOSEC) 20 MG capsule without the extended release. Asking if provider can send over another prescription without extended release.  [facility]  [phone]   Please advise  [facility]

## 2023-10-12 NOTE — Telephone Encounter (Signed)
 Rx send to pharmacy

## 2023-10-12 NOTE — Telephone Encounter (Signed)
 Ok with me. Please place any necessary orders.

## 2023-10-12 NOTE — Telephone Encounter (Signed)
 See note

## 2023-10-13 MED ORDER — OXYCODONE-ACETAMINOPHEN 5-325 MG PO TABS: 1.0000 | ORAL_TABLET | Freq: Four times a day (QID) | ORAL | 0 refills | Status: DC | PRN

## 2023-10-19 ENCOUNTER — Ambulatory Visit: Attending: Family Medicine

## 2023-10-19 ENCOUNTER — Other Ambulatory Visit: Payer: Self-pay

## 2023-10-19 DIAGNOSIS — M25561 Pain in right knee: Secondary | ICD-10-CM | POA: Insufficient documentation

## 2023-10-19 DIAGNOSIS — E1149 Type 2 diabetes mellitus with other diabetic neurological complication: Secondary | ICD-10-CM | POA: Insufficient documentation

## 2023-10-19 DIAGNOSIS — R262 Difficulty in walking, not elsewhere classified: Secondary | ICD-10-CM | POA: Diagnosis present

## 2023-10-19 NOTE — Therapy (Signed)
 OUTPATIENT PHYSICAL THERAPY LOWER EXTREMITY EVALUATION   Patient Name: Traci Harper MRN: [id number] DOB:January 24, 1956, 69 y.o., female Today's Date: 10/19/2023  END OF SESSION:  PT End of Session - 10/19/23 1434     Visit Number 1    Date for PT Re-Evaluation 12/14/23    Progress Note Due on Visit 10    PT Start Time 1430    PT Stop Time 1515    PT Time Calculation (min) 45 min    Activity Tolerance Patient tolerated treatment well    Behavior During Therapy WFL for tasks assessed/performed             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 7   Thyroid  disease    Past Surgical History:  Procedure Laterality Date   APPENDECTOMY     BACK SURGERY     BREAST BIOPSY Right    CESAREAN SECTION     x2   CHOLECYSTECTOMY     DILATION AND CURETTAGE OF UTERUS     at 30   HEMORRHOID SURGERY     HERNIA REPAIR     LIVER TRANSPLANT  09/17/2012   @[facility]    THYROID  SURGERY     x2   TUBAL LIGATION     V-TACH ABLATION     Patient Active Problem List   Diagnosis Date Noted   Diabetic neuropathy (HCC) 03/26/2023   Senile purpura (HCC) 03/26/2023   Sleep-disordered breathing 06/13/2022   Stress 10/01/2021   Dyslipidemia associated with type 2 diabetes mellitus (HCC) 10/01/2021   Pulmonary embolism (HCC) 04/02/2021   Hypertension associated with diabetes (HCC) 04/23/2020   Type 2 diabetes mellitus with hyperglycemia (HCC) 04/23/2020   Hypothyroidism 04/23/2020   GERD (gastroesophageal reflux disease) 04/23/2020   Liver transplanted 2014 [facility]) follows with [facility] 04/01/2013    PCP: Valdene Garret, MD  REFERRING PROVIDER: same  REFERRING DIAG: R knee pain /contusion  THERAPY DIAG:  Difficulty in walking, not elsewhere classified  Acute pain of right knee  Rationale for Evaluation and Treatment:  Rehabilitation  ONSET DATE: 09/22/23  SUBJECTIVE:   SUBJECTIVE STATEMENT: Feel better than I did, bruise is fading, I just can't get rid of this swelling  PERTINENT HISTORY: Fell during the night over cat, landed on R knee  PAIN:  Are you having pain? Yes: NPRS scale: 3to 7 Pain location: R ant/med knee Pain description: aching, very tender to palpate Aggravating factors: stair ascending and descending Relieving factors: sitting  PRECAUTIONS: Fall  RED FLAGS: None   WEIGHT BEARING RESTRICTIONS: No  FALLS:  Has patient fallen in last 6 months? Yes. 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  DIAGNOSTIC FINDINGS: no fractures   PATIENT SURVEYS:  Kujala Score (Anterior Knee Pain Scale - AKPS): 41 / 100 = 41.0 %  COGNITION: Overall cognitive status: Within functional limits for tasks assessed     SENSATION: Reports loss of sensation B feet due to longstanding neuropathy  EDEMA:  Bulbous edema  R ant/med knee  MUSCLE LENGTH: Hamstrings: Right wfl deg; Left wfl deg POSTURE: weight shift left, edematous R ant knee, fading brusing  PALPATION: Exquisitely tender R ant med knee jt line distal ant thigh.  No warmth noted, R patella sits laterally at rest as compared to L  LOWER EXTREMITY ROM:  Passive ROM Right eval Left eval  Hip flexion    Hip extension    Hip abduction    Hip adduction    Hip internal rotation    Hip external rotation    Knee flexion 90   Knee extension wfl   Ankle dorsiflexion    Ankle plantarflexion    Ankle inversion    Ankle eversion     (Blank rows = not  tested)  LOWER EXTREMITY MMT:  MMT Right eval Left eval  Hip flexion    Hip extension    Hip abduction    Hip adduction    Hip internal rotation    Hip external rotation    Knee flexion wnl   Knee extension long arc quad 10 degree lag, but wfl with MMT   Ankle dorsiflexion    Ankle plantarflexion 4/5   Ankle inversion    Ankle eversion     (Blank rows = not tested)  LOWER EXTREMITY SPECIAL TESTS:  Na too acute  FUNCTIONAL TESTS:  5 times sit to stand: 10 Functional gait assessment: TBD  GAIT: Distance walked: in clinic up to 70', no device, mild antalgia R                                                                                                                                TREATMENT DATE: 10/19/23:  Supine for ultrasound continuous over medial and ant R knee 6 min 1.5 w/sm2 Instructed and demonstrated elevation of R LE over heart to reduce R knee edema.  Instructed in ankle pumps with black t band to improve  muscle pump R ankle/Lower ext Kinesiotaping R ant knee for edema management, 2 I pieces cut in [place] lantern shape, paper on to reduce edema Seated R quad sets with R LE ER 40 degrees   PATIENT EDUCATION:  Education details: POC, goals Person educated: Patient Education method: Explanation, Demonstration, Tactile cues, and Verbal cues Education comprehension: verbalized understanding, returned demonstration, verbal cues required, and tactile cues required  HOME EXERCISE PROGRAM: Access Code: [facility] URL: [email or web address] Date: 10/19/2023 Prepared by: Tariana Moldovan  Exercises - Ankle and Toe Plantarflexion with Resistance  - 1 x daily - 7 x weekly - 3 sets - 10 reps - Seated Quad Set  - 1 x daily - 7 x weekly - 3 sets - 10 reps  ASSESSMENT:  CLINICAL IMPRESSION: Patient is a 68 y.o. female who was evaluated today by physical therapy following a fall on R ant/med knee and resultant large contusion.  Presents with pain , loss of  ROM R knee, some loss of muscle mass R  quads and plantarflexors.  Her injury was approximately 4 weeks ago. Should respond well to brief duration with physical therapy to reduce her edema and retrain her affected R LE musculature.  Needs further balance evaluation with FGA.  Advised her to have night lights due to loss of sensation B feet.  OBJECTIVE IMPAIRMENTS: decreased ROM, decreased strength, increased edema, impaired perceived functional ability, and pain.   ACTIVITY LIMITATIONS: carrying, squatting, sleeping, stairs, transfers, locomotion level, and caring for others  PARTICIPATION LIMITATIONS: cleaning, laundry, shopping, and community activity  PERSONAL FACTORS: Fitness, Past/current experiences, Time since onset of injury/illness/exacerbation, and 1-2 comorbidities: Dm with peripheral neuropathy are also affecting patient's functional outcome.   REHAB POTENTIAL: Good  CLINICAL DECISION MAKING: Evolving/moderate complexity  EVALUATION COMPLEXITY: Moderate   GOALS: Goals reviewed with patient? Yes  SHORT TERM GOALS: Target date: 2 weeks 11/02/23 I HEP Baseline: Goal status: INITIAL   LONG TERM GOALS: Target date: 12/14/23, 8 weeks  FGA 28/30, Baseline: TBD Goal status: INITIAL  2.  ROM R knee flex 115 Baseline: 90 Goal status: INITIAL  3.  Long arc quad no lag R LE Baseline: 10 degree lag Goal status: INITIAL  4.  Kujala Score (Anterior Knee Pain Scale - AKPS): 41 / 100 = 41.0 %, improve to 75% or greater  Baseline:  Goal status: INITIAL   PLAN:  PT FREQUENCY: 2x/week  PT DURATION: 8 weeks  PLANNED INTERVENTIONS: 97110-Therapeutic exercises, 97530- Therapeutic activity, [id number]- Neuromuscular re-education, 97535- Self Care, and 16109- Manual therapy  PLAN FOR NEXT SESSION: how was kinesiotaping, therex ultrasound.  Add NMES for R distal quads to improve muscle pump    Iveliz Garay L Emiliana Blaize, PT, DPT, OCS 10/19/2023, 5:27 PM

## 2023-10-21 ENCOUNTER — Other Ambulatory Visit: Payer: Self-pay

## 2023-10-21 ENCOUNTER — Ambulatory Visit

## 2023-10-21 DIAGNOSIS — M25561 Pain in right knee: Secondary | ICD-10-CM

## 2023-10-21 DIAGNOSIS — R262 Difficulty in walking, not elsewhere classified: Secondary | ICD-10-CM | POA: Diagnosis not present

## 2023-10-21 NOTE — Therapy (Signed)
 OUTPATIENT PHYSICAL THERAPY LOWER EXTREMITY TREATMENT   Patient Name: Traci Harper MRN: [id number] DOB:30-Jul-1955, 68 y.o., female Today's Date: 10/21/2023  END OF SESSION:  PT End of Session - 10/21/23 1147     Visit Number 2    Date for PT Re-Evaluation 12/14/23    Progress Note Due on Visit 10    PT Start Time 1102    PT Stop Time 1145    PT Time Calculation (min) 43 min    Activity Tolerance Patient tolerated treatment well    Behavior During Therapy WFL for tasks assessed/performed              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 26   Thyroid  disease    Past Surgical History:  Procedure Laterality Date   APPENDECTOMY     BACK SURGERY     BREAST BIOPSY Right    CESAREAN SECTION     x2   CHOLECYSTECTOMY     DILATION AND CURETTAGE OF UTERUS     at 88   HEMORRHOID SURGERY     HERNIA REPAIR     LIVER TRANSPLANT  09/17/2012   @[facility]    THYROID  SURGERY     x2   TUBAL LIGATION     V-TACH ABLATION     Patient Active Problem List   Diagnosis Date Noted   Diabetic neuropathy (HCC) 03/26/2023   Senile purpura (HCC) 03/26/2023   Sleep-disordered breathing 06/13/2022   Stress 10/01/2021   Dyslipidemia associated with type 2 diabetes mellitus (HCC) 10/01/2021   Pulmonary embolism (HCC) 04/02/2021   Hypertension associated with diabetes (HCC) 04/23/2020   Type 2 diabetes mellitus with hyperglycemia (HCC) 04/23/2020   Hypothyroidism 04/23/2020   GERD (gastroesophageal reflux disease) 04/23/2020   Liver transplanted 2014 [facility] follows with [facility] 04/01/2013    PCP: Valdene Garret, MD  REFERRING PROVIDER: same  REFERRING DIAG: R knee pain /contusion  THERAPY DIAG:  Difficulty in walking, not elsewhere classified  Acute pain of right knee  Rationale for Evaluation and Treatment:  Rehabilitation  ONSET DATE: 09/22/23  SUBJECTIVE:   SUBJECTIVE STATEMENT: Feel better than I did, large change in the size of R knee after last session  PERTINENT HISTORY: Marvell Slider during the night over cat, landed on R knee  PAIN:  Are you having pain? Yes: NPRS scale: 3to 7 Pain location: R ant/med knee Pain description: aching, very tender to palpate Aggravating factors: stair ascending and descending Relieving factors: sitting  PRECAUTIONS: Fall  RED FLAGS: None   WEIGHT BEARING RESTRICTIONS: No  FALLS:  Has patient fallen in last 6 months? Yes. 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  DIAGNOSTIC FINDINGS: no fractures   PATIENT SURVEYS:  Kujala Score (Anterior Knee Pain Scale - AKPS): 41 / 100 = 41.0 %  COGNITION: Overall cognitive status: Within functional limits for tasks assessed     SENSATION: Reports loss of sensation B feet due to longstanding neuropathy  EDEMA:  Bulbous  edema R ant/med knee  MUSCLE LENGTH: Hamstrings: Right wfl deg; Left wfl deg POSTURE: weight shift left, edematous R ant knee, fading brusing  PALPATION: Exquisitely tender R ant med knee jt line distal ant thigh.  No warmth noted, R patella sits laterally at rest as compared to L  LOWER EXTREMITY ROM:  Passive ROM Right eval Left eval  Hip flexion    Hip extension    Hip abduction    Hip adduction    Hip internal rotation    Hip external rotation    Knee flexion 90   Knee extension wfl   Ankle dorsiflexion    Ankle plantarflexion    Ankle inversion    Ankle eversion     (Blank rows = not tested)  LOWER  EXTREMITY MMT:  MMT Right eval Left eval  Hip flexion    Hip extension    Hip abduction    Hip adduction    Hip internal rotation    Hip external rotation    Knee flexion wnl   Knee extension long arc quad 10 degree lag, but wfl with MMT   Ankle dorsiflexion    Ankle plantarflexion 4/5   Ankle inversion    Ankle eversion     (Blank rows = not tested)  LOWER EXTREMITY SPECIAL TESTS:  Na too acute  FUNCTIONAL TESTS:  5 times sit to stand: 10 Functional gait assessment: TBD  GAIT: Distance walked: in clinic up to 70', no device, mild antalgia R                                                                                                                                TREATMENT DATE:  10/21/23:  Supine with wedge under R LE,  for electrical stim R distal quads, russian, 10 sec on, 10 sec off, for 6 min total, 4 pads to engage R quads.  Ultrasound, 1 MHz, 1.5 w/cm 2, cont, x 7 min R med, ant knee and distal quads  Kinesiotaping R medial knee, 2 I pieces with lantern cut , paper on to reduce edema  R knee flexion over 100 degrees today   Verbally reviewed elevation for edema and the ankle pumps with t band and SLR with R LE ER as well  10/19/23:  Supine for ultrasound continuous over medial and ant R knee 6 min 1.5 w/sm2 Instructed and demonstrated elevation of R LE over heart to reduce R knee edema.  Instructed in ankle pumps with black t band to improve  muscle pump R ankle/Lower ext Kinesiotaping R ant knee for edema management, 2 I pieces cut in [place] lantern shape, paper on to reduce edema Seated R quad sets with R LE ER 40 degrees   PATIENT EDUCATION:  Education details: POC, goals Person educated: Patient Education method: Explanation, Demonstration, Tactile cues, and Verbal cues Education comprehension: verbalized understanding, returned demonstration, verbal cues required, and tactile cues required  HOME EXERCISE PROGRAM:  Access Code: [email or web address] URL:  [email or web address] Date: 10/19/2023 Prepared by: Keira Bohlin  Exercises - Ankle and Toe Plantarflexion with Resistance  - 1 x daily - 7 x weekly - 3 sets - 10 reps - Seated Quad Set  - 1 x daily - 7 x weekly - 3 sets - 10 reps  ASSESSMENT:  CLINICAL IMPRESSION: Patient is a 68 y.o. female who participated in her second session today by physical therapy following a fall on R ant/med knee and resultant large contusion.  Presents with pain , loss of ROM R knee, some loss of muscle mass R quads and plantarflexors.  Her injury was approximately 4 1/2 weeks ago. Already improved function after initial session, obvious change in color R knee, less staining and improved R knee flexion ROM. Should respond well to brief duration with physical therapy to reduce her edema and retrain her affected R LE musculature.  Needs further balance evaluation with FGA.  Advised her to have night lights due to loss of sensation B feet.  OBJECTIVE IMPAIRMENTS: decreased ROM, decreased strength, increased edema, impaired perceived functional ability, and pain.   ACTIVITY LIMITATIONS: carrying, squatting, sleeping, stairs, transfers, locomotion level, and caring for others  PARTICIPATION LIMITATIONS: cleaning, laundry, shopping, and community activity  PERSONAL FACTORS: Fitness, Past/current experiences, Time since onset of injury/illness/exacerbation, and 1-2 comorbidities: Dm with peripheral neuropathy are also affecting patient's functional outcome.   REHAB POTENTIAL: Good  CLINICAL DECISION MAKING: Evolving/moderate complexity  EVALUATION COMPLEXITY: Moderate   GOALS: Goals reviewed with patient? Yes  SHORT TERM GOALS: Target date: 2 weeks 11/02/23 I HEP Baseline: Goal status: INITIAL   LONG TERM GOALS: Target date: 12/14/23, 8 weeks  FGA 28/30, Baseline: TBD Goal status: INITIAL  2.  ROM R knee flex 115 Baseline: 90 Goal status: INITIAL  3.  Long arc quad no lag R LE Baseline:  10 degree lag Goal status: INITIAL  4.  Kujala Score (Anterior Knee Pain Scale - AKPS): 41 / 100 = 41.0 %, improve to 75% or greater  Baseline:  Goal status: INITIAL   PLAN:  PT FREQUENCY: 2x/week  PT DURATION: 8 weeks  PLANNED INTERVENTIONS: 97110-Therapeutic exercises, 97530- Therapeutic activity, [id number]- Neuromuscular re-education, 97535- Self Care, and [id number]- Manual therapy  PLAN FOR NEXT SESSION: how was kinesiotaping, therex ultrasound. Continue NMES for R distal quads to improve muscle pump . Progress ex as needed.   Lilliann Rossetti L Sabrina Keough, PT, DPT, OCS 10/21/2023, 11:56 AM

## 2023-10-30 ENCOUNTER — Other Ambulatory Visit: Payer: Self-pay | Admitting: Family Medicine

## 2023-11-03 ENCOUNTER — Encounter: Payer: Self-pay | Admitting: Physical Therapy

## 2023-11-03 ENCOUNTER — Ambulatory Visit: Attending: Family Medicine | Admitting: Physical Therapy

## 2023-11-03 DIAGNOSIS — M25561 Pain in right knee: Secondary | ICD-10-CM | POA: Diagnosis present

## 2023-11-03 DIAGNOSIS — R262 Difficulty in walking, not elsewhere classified: Secondary | ICD-10-CM | POA: Insufficient documentation

## 2023-11-03 NOTE — Therapy (Signed)
 OUTPATIENT PHYSICAL THERAPY LOWER EXTREMITY TREATMENT   Patient Name: Traci Harper MRN: [id number] DOB:Jul 20, 1955, 68 y.o., female Today's Date: 11/03/2023  END OF SESSION:  PT End of Session - 11/03/23 1143     Visit Number 3    Date for PT Re-Evaluation 12/14/23    PT Start Time 1145    PT Stop Time 1230    PT Time Calculation (min) 45 min    Activity Tolerance Patient tolerated treatment well    Behavior During Therapy WFL for tasks assessed/performed              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 86   Thyroid  disease    Past Surgical History:  Procedure Laterality Date   APPENDECTOMY     BACK SURGERY     BREAST BIOPSY Right    CESAREAN SECTION     x2   CHOLECYSTECTOMY     DILATION AND CURETTAGE OF UTERUS     at 59   HEMORRHOID SURGERY     HERNIA REPAIR     LIVER TRANSPLANT  09/17/2012   @[facility] [place]    THYROID  SURGERY     x2   TUBAL LIGATION     V-TACH ABLATION     Patient Active Problem List   Diagnosis Date Noted   Diabetic neuropathy (HCC) 03/26/2023   Senile purpura (HCC) 03/26/2023   Sleep-disordered breathing 06/13/2022   Stress 10/01/2021   Dyslipidemia associated with type 2 diabetes mellitus (HCC) 10/01/2021   Pulmonary embolism (HCC) 04/02/2021   Hypertension associated with diabetes (HCC) 04/23/2020   Type 2 diabetes mellitus with hyperglycemia (HCC) 04/23/2020   Hypothyroidism 04/23/2020   GERD (gastroesophageal reflux disease) 04/23/2020   Liver transplanted 2014 [facility] follows with [facility] 04/01/2013    PCP: Valdene Garret, MD  REFERRING PROVIDER: same  REFERRING DIAG: R knee pain /contusion  THERAPY DIAG:  Difficulty in walking, not elsewhere classified  Acute pain of right knee  Rationale for Evaluation and Treatment: Rehabilitation  ONSET DATE: 09/22/23  SUBJECTIVE:    SUBJECTIVE STATEMENT: "Its getting a lot better" feels better, not hurting as much  PERTINENT HISTORY: Marvell Slider during the night over cat, landed on R knee  PAIN:  Are you having pain? Yes: NPRS scale: 3to 7 Pain location: R ant/med knee Pain description: aching, very tender to palpate Aggravating factors: stair ascending and descending Relieving factors: sitting  PRECAUTIONS: Fall  RED FLAGS: None   WEIGHT BEARING RESTRICTIONS: No  FALLS:  Has patient fallen in last 6 months? Yes. 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  DIAGNOSTIC FINDINGS: no fractures   PATIENT SURVEYS:  Kujala Score (Anterior Knee Pain Scale - AKPS): 41 / 100 = 41.0 %  COGNITION: Overall cognitive status: Within functional limits for tasks assessed     SENSATION: Reports loss of sensation B feet due to longstanding neuropathy  EDEMA:  Bulbous edema R ant/med knee  MUSCLE LENGTH: Hamstrings: Right wfl deg; Left wfl deg  POSTURE: weight shift left, edematous R ant knee, fading brusing  PALPATION: Exquisitely tender R ant med knee jt line distal ant thigh.  No warmth noted, R patella sits laterally at rest as compared to L  LOWER EXTREMITY ROM:  Passive ROM Right eval Left eval  Hip flexion    Hip extension    Hip abduction    Hip adduction    Hip internal rotation    Hip external rotation    Knee flexion 90   Knee extension wfl   Ankle dorsiflexion    Ankle plantarflexion    Ankle inversion    Ankle eversion     (Blank rows = not tested)  LOWER EXTREMITY MMT:  MMT Right eval Left eval  Hip flexion    Hip  extension    Hip abduction    Hip adduction    Hip internal rotation    Hip external rotation    Knee flexion wnl   Knee extension long arc quad 10 degree lag, but wfl with MMT   Ankle dorsiflexion    Ankle plantarflexion 4/5   Ankle inversion    Ankle eversion     (Blank rows = not tested)  LOWER EXTREMITY SPECIAL TESTS:  Na too acute  FUNCTIONAL TESTS:  5 times sit to stand: 10 Functional gait assessment: TBD  GAIT: Distance walked: in clinic up to 70', no device, mild antalgia R                                                                                                                                TREATMENT DATE:  11/03/23 R knee PROM with end range holds  Patellar mobs  Bike L2.5 x 6 min 4in step ups x10 each HS Curls green 2x15 Sit to stand 2x10 Resisted backwards walking 20lb x5  Side steps x3 each Alt 6in box taps from airex x10  Heel raises 2x10 calf stretch  10/21/23:  Supine with wedge under R LE,  for electrical stim R distal quads, russian, 10 sec on, 10 sec off, for 6 min total, 4 pads to engage R quads.  Ultrasound, 1 MHz, 1.5 w/cm 2, cont, x 7 min R med, ant knee and distal quads  Kinesiotaping R medial knee, 2 I pieces with lantern cut , paper on to reduce edema  R knee flexion over 100 degrees today   Verbally reviewed elevation for edema and the ankle pumps with t band and SLR with R LE ER as well  10/19/23:  Supine for ultrasound continuous over medial and ant R knee 6 min 1.5 w/sm2 Instructed and demonstrated elevation of R LE over heart to reduce R knee edema.  Instructed in ankle pumps with black t band to improve  muscle pump R ankle/Lower ext Kinesiotaping R ant knee for edema management, 2 I pieces cut in [place] lantern shape, paper on to reduce edema Seated R quad sets with R LE ER  40 degrees   PATIENT EDUCATION:  Education details: POC, goals Person educated: Patient Education method: Explanation, Demonstration, Tactile cues,  and Verbal cues Education comprehension: verbalized understanding, returned demonstration, verbal cues required, and tactile cues required  HOME EXERCISE PROGRAM: Access Code: [email or web address] URL: [email or web address] Date: 10/19/2023 Prepared by: Amy Speaks  Exercises - Ankle and Toe Plantarflexion with Resistance  - 1 x daily - 7 x weekly - 3 sets - 10 reps - Seated Quad Set  - 1 x daily - 7 x weekly - 3 sets - 10 reps  ASSESSMENT:  CLINICAL IMPRESSION: Patient is a 68 y.o. female who participated in her third session today by physical therapy following a fall on R ant/med knee and resultant large contusion. She enters reporting improvement overall with less pain. She reports no functional limitations. Cue for full ROM needed with HS curls and LAQ. Slight compensation present with sit to stands favoring her L side. Some instability with alt box taps from airex. Pt did reveal that she doe have some neuropathy in both feet.     OBJECTIVE IMPAIRMENTS: decreased ROM, decreased strength, increased edema, impaired perceived functional ability, and pain.   ACTIVITY LIMITATIONS: carrying, squatting, sleeping, stairs, transfers, locomotion level, and caring for others  PARTICIPATION LIMITATIONS: cleaning, laundry, shopping, and community activity  PERSONAL FACTORS: Fitness, Past/current experiences, Time since onset of injury/illness/exacerbation, and 1-2 comorbidities: Dm with peripheral neuropathy are also affecting patient's functional outcome.   REHAB POTENTIAL: Good  CLINICAL DECISION MAKING: Evolving/moderate complexity  EVALUATION COMPLEXITY: Moderate   GOALS: Goals reviewed with patient? Yes  SHORT TERM GOALS: Target date: 2 weeks 11/02/23 I HEP Baseline: Goal status: INITIAL   LONG TERM GOALS: Target date: 12/14/23, 8 weeks  FGA 28/30, Baseline: TBD Goal status: INITIAL  2.  ROM R knee flex 115 Baseline: 90 Goal status: INITIAL  3.  Long arc quad no lag R  LE Baseline: 10 degree lag Goal status: INITIAL  4.  Kujala Score (Anterior Knee Pain Scale - AKPS): 41 / 100 = 41.0 %, improve to 75% or greater  Baseline:  Goal status: INITIAL   PLAN:  PT FREQUENCY: 2x/week  PT DURATION: 8 weeks  PLANNED INTERVENTIONS: 97110-Therapeutic exercises, 97530- Therapeutic activity, [id number]- Neuromuscular re-education, 97535- Self Care, and [id number]- Manual therapy  PLAN FOR NEXT SESSION: how was kinesiotaping, therex ultrasound. . Progress ex as needed.   Ollen Beverage, PTA,  11/03/2023, 11:43 AM

## 2023-11-10 ENCOUNTER — Encounter: Payer: Self-pay | Admitting: Physical Therapy

## 2023-11-10 ENCOUNTER — Ambulatory Visit: Admitting: Physical Therapy

## 2023-11-10 DIAGNOSIS — R262 Difficulty in walking, not elsewhere classified: Secondary | ICD-10-CM

## 2023-11-10 DIAGNOSIS — M25561 Pain in right knee: Secondary | ICD-10-CM

## 2023-11-10 NOTE — Therapy (Signed)
 OUTPATIENT PHYSICAL THERAPY LOWER EXTREMITY TREATMENT   Patient Name: Traci Harper MRN: [id number] DOB:[birth year], 68 y.o., female Today's Date: 11/10/2023  END OF SESSION:  PT End of Session - 11/10/23 1058     Visit Number 4    Date for PT Re-Evaluation 12/14/23    PT Start Time 1100    PT Stop Time 1145    PT Time Calculation (min) 45 min    Activity Tolerance Patient tolerated treatment well    Behavior During Therapy WFL for tasks assessed/performed              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 36   Thyroid  disease    Past Surgical History:  Procedure Laterality Date   APPENDECTOMY     BACK SURGERY     BREAST BIOPSY Right    CESAREAN SECTION     x2   CHOLECYSTECTOMY     DILATION AND CURETTAGE OF UTERUS     at 85   HEMORRHOID SURGERY     HERNIA REPAIR     LIVER TRANSPLANT  09/17/2012   @[facility]    THYROID  SURGERY     x2   TUBAL LIGATION     V-TACH ABLATION     Patient Active Problem List   Diagnosis Date Noted   Diabetic neuropathy (HCC) 03/26/2023   Senile purpura (HCC) 03/26/2023   Sleep-disordered breathing 06/13/2022   Stress 10/01/2021   Dyslipidemia associated with type 2 diabetes mellitus (HCC) 10/01/2021   Pulmonary embolism (HCC) 04/02/2021   Hypertension associated with diabetes (HCC) 04/23/2020   Type 2 diabetes mellitus with hyperglycemia (HCC) 04/23/2020   Hypothyroidism 04/23/2020   GERD (gastroesophageal reflux disease) 04/23/2020   Liver transplanted 2014 [facility]) follows with [facility] 04/01/2013    PCP: Valdene Garret, MD  REFERRING PROVIDER: same  REFERRING DIAG: R knee pain /contusion  THERAPY DIAG:  Difficulty in walking, not elsewhere classified  Acute pain of right knee  Rationale for Evaluation and Treatment: Rehabilitation  ONSET DATE: 09/22/23  SUBJECTIVE:    SUBJECTIVE STATEMENT: "Its doing better"  PERTINENT HISTORY: Fell during the night over cat, landed on R knee  PAIN:  Are you having pain? Yes: NPRS scale: 0/10 Pain location: R ant/med knee Pain description: aching, very tender to palpate Aggravating factors: stair ascending and descending Relieving factors: sitting  PRECAUTIONS: Fall  RED FLAGS: None   WEIGHT BEARING RESTRICTIONS: No  FALLS:  Has patient fallen in last 6 months? Yes. 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  DIAGNOSTIC FINDINGS: no fractures   PATIENT SURVEYS:  Kujala Score (Anterior Knee Pain Scale - AKPS): 41 / 100 = 41.0 %  COGNITION: Overall cognitive status: Within functional limits for tasks assessed     SENSATION: Reports loss of sensation B feet due to longstanding neuropathy  EDEMA:  Bulbous edema R ant/med knee  MUSCLE LENGTH: Hamstrings: Right wfl deg; Left wfl deg POSTURE: weight shift left, edematous R ant knee, fading  brusing  PALPATION: Exquisitely tender R ant med knee jt line distal ant thigh.  No warmth noted, R patella sits laterally at rest as compared to L  LOWER EXTREMITY ROM:  Passive ROM Right eval Right  11/10/23  Hip flexion    Hip extension    Hip abduction    Hip adduction    Hip internal rotation    Hip external rotation    Knee flexion 90 122  Knee extension wfl 0  Ankle dorsiflexion    Ankle plantarflexion    Ankle inversion    Ankle eversion     (Blank rows = not tested)  LOWER EXTREMITY MMT:  MMT Right eval Left eval  Hip flexion    Hip extension    Hip abduction    Hip  adduction    Hip internal rotation    Hip external rotation    Knee flexion wnl 5/5  Knee extension long arc quad 10 degree lag, but wfl with MMT 4/5  Ankle dorsiflexion    Ankle plantarflexion 4/5   Ankle inversion    Ankle eversion     (Blank rows = not tested)  LOWER EXTREMITY SPECIAL TESTS:  Na too acute  FUNCTIONAL TESTS:  5 times sit to stand: 10 Functional gait assessment: TBD  GAIT: Distance walked: in clinic up to 70', no device, mild antalgia R                                                                                                                                TREATMENT DATE:  11/10/23 NuStep L 5 x 6 min Goals  Sit to stand holding yellow ball 2x10 HS Curls green 2x15 RLE LAQ RLE 3lb 2x10 Forward & lateral 6in step ups x10 Leg press 30lb 2x15  Hs curls 25lb 2x10 Leg Ext 10lb 2x10  11/03/23 R knee PROM with end range holds  Patellar mobs  Bike L2.5 x 6 min 4in step ups x10 each HS Curls green 2x15 Sit to stand 2x10 Resisted backwards walking 20lb x5  Side steps x3 each Alt 6in box taps from airex x10  Heel raises 2x10 calf stretch  10/21/23:  Supine with wedge under R LE,  for electrical stim R distal quads, russian, 10 sec on, 10 sec off, for 6 min total, 4 pads to engage R quads.  Ultrasound, 1 MHz, 1.5 w/cm 2, cont, x 7 min R med, ant knee and distal quads  Kinesiotaping R medial knee, 2 I pieces with lantern cut , paper on to reduce edema  R knee flexion over 100 degrees today   Verbally reviewed elevation for edema and the ankle pumps with t band and SLR with R LE ER as well  10/19/23:  Supine for ultrasound continuous over medial and ant R knee 6 min 1.5 w/sm2 Instructed and demonstrated elevation of R LE over heart to reduce R knee edema.  Instructed in ankle pumps with black  t band to improve  muscle pump R ankle/Lower ext Kinesiotaping R ant knee for edema management, 2 I pieces cut in [place] lantern shape, paper on to reduce  edema Seated R quad sets with R LE ER 40 degrees   PATIENT EDUCATION:  Education details: POC, goals Person educated: Patient Education method: Explanation, Demonstration, Tactile cues, and Verbal cues Education comprehension: verbalized understanding, returned demonstration, verbal cues required, and tactile cues required  HOME EXERCISE PROGRAM: Access Code: [facility] URL: [email or web address] Date: 10/19/2023 Prepared by: Amy Speaks  Exercises - Ankle and Toe Plantarflexion with Resistance  - 1 x daily - 7 x weekly - 3 sets - 10 reps - Seated Quad Set  - 1 x daily - 7 x weekly - 3 sets - 10 reps  ASSESSMENT:  CLINICAL IMPRESSION: Patient is a 68 y.o. female who participated in her fourth session today by physical therapy following a fall on R ant/med knee and resultant large contusion. She continues to  report improvement overall with less pain. She has progressed increasing he R knee AROM in both directions. She has progressed meeting some LTGs. Despite improvement she does demo some functional RLE weakness with step ups. Cue for full ROM needed with HS curls and LAQ. Slight compensation present with sit to stands favoring her L side   OBJECTIVE IMPAIRMENTS: decreased ROM, decreased strength, increased edema, impaired perceived functional ability, and pain.   ACTIVITY LIMITATIONS: carrying, squatting, sleeping, stairs, transfers, locomotion level, and caring for others  PARTICIPATION LIMITATIONS: cleaning, laundry, shopping, and community activity  PERSONAL FACTORS: Fitness, Past/current experiences, Time since onset of injury/illness/exacerbation, and 1-2 comorbidities: Dm with peripheral neuropathy are also affecting patient's functional outcome.   REHAB POTENTIAL: Good  CLINICAL DECISION MAKING: Evolving/moderate complexity  EVALUATION COMPLEXITY: Moderate   GOALS: Goals reviewed with patient? Yes  SHORT TERM GOALS: Target date: 2 weeks 11/02/23 I  HEP Baseline: Goal status: Met 11/10/22   LONG TERM GOALS: Target date: 12/14/23, 8 weeks  FGA 28/30, Baseline: TBD Goal status: INITIAL  2.  ROM R knee flex 115 Baseline: 90 Goal status: Met  // 3.  Long arc quad no lag R LE Baseline: 10 degree lag Goal status: INITIAL  4.  Kujala Score (Anterior Knee Pain Scale - AKPS): 41 / 100 = 41.0 %, improve to 75% or greater  Baseline:  Goal status:  84 / 100 = 84.0 % Met  //  PLAN:  PT FREQUENCY: 2x/week  PT DURATION: 8 weeks  PLANNED INTERVENTIONS: 97110-Therapeutic exercises, 97530- Therapeutic activity, [id number]- Neuromuscular re-education, 97535- Self Care, and 69629- Manual therapy  PLAN FOR NEXT SESSION: how was kinesiotaping, therex ultrasound. . Progress ex as needed.   Ollen Beverage, PTA,  11/10/2023, 10:59 AM

## 2023-11-17 ENCOUNTER — Encounter: Payer: Self-pay | Admitting: Physical Therapy

## 2023-11-17 ENCOUNTER — Ambulatory Visit: Admitting: Physical Therapy

## 2023-11-17 DIAGNOSIS — R262 Difficulty in walking, not elsewhere classified: Secondary | ICD-10-CM | POA: Diagnosis not present

## 2023-11-17 DIAGNOSIS — M25561 Pain in right knee: Secondary | ICD-10-CM

## 2023-11-17 NOTE — Therapy (Signed)
 OUTPATIENT PHYSICAL THERAPY LOWER EXTREMITY TREATMENT   Patient Name: Traci Harper MRN: [id number] DOB:14-Feb-1956, 68 y.o., female Today's Date: 11/17/2023  END OF SESSION:  PT End of Session - 11/17/23 1342     Visit Number 5    PT Start Time 1342    PT Stop Time 1427    PT Time Calculation (min) 45 min    Activity Tolerance Patient tolerated treatment well    Behavior During Therapy WFL for tasks assessed/performed           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 62   Thyroid  disease    Past Surgical History:  Procedure Laterality Date   APPENDECTOMY     BACK SURGERY     BREAST BIOPSY Right    CESAREAN SECTION     x2   CHOLECYSTECTOMY     DILATION AND CURETTAGE OF UTERUS     at 74   HEMORRHOID SURGERY     HERNIA REPAIR     LIVER TRANSPLANT  09/17/2012   @[facility]    THYROID  SURGERY     x2   TUBAL LIGATION     V-TACH ABLATION     Patient Active Problem List   Diagnosis Date Noted   Diabetic neuropathy (HCC) 03/26/2023   Senile purpura (HCC) 03/26/2023   Sleep-disordered breathing 06/13/2022   Stress 10/01/2021   Dyslipidemia associated with type 2 diabetes mellitus (HCC) 10/01/2021   Pulmonary embolism (HCC) 04/02/2021   Hypertension associated with diabetes (HCC) 04/23/2020   Type 2 diabetes mellitus with hyperglycemia (HCC) 04/23/2020   Hypothyroidism 04/23/2020   GERD (gastroesophageal reflux disease) 04/23/2020   Liver transplanted 2014 [facility]) follows with [facility] 04/01/2013    PCP: Valdene Garret, MD  REFERRING PROVIDER: same  REFERRING DIAG: R knee pain /contusion  THERAPY DIAG:  Difficulty in walking, not elsewhere classified  Acute pain of right knee  Rationale for Evaluation and Treatment: Rehabilitation  ONSET DATE: 09/22/23  SUBJECTIVE:   SUBJECTIVE STATEMENT: Im  ok  PERTINENT HISTORY: Fell during the night over cat, landed on R knee  PAIN:  Are you having pain? Yes: NPRS scale: 0/10 Pain location: R ant/med knee Pain description: aching, very tender to palpate Aggravating factors: stair ascending and descending Relieving factors: sitting  PRECAUTIONS: Fall  RED FLAGS: None   WEIGHT BEARING RESTRICTIONS: No  FALLS:  Has patient fallen in last 6 months? Yes. 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  DIAGNOSTIC FINDINGS: no fractures   PATIENT SURVEYS:  Kujala Score (Anterior Knee Pain Scale - AKPS): 41 / 100 = 41.0 %  COGNITION: Overall cognitive status: Within functional limits for tasks assessed     SENSATION: Reports loss of sensation B feet due to longstanding neuropathy  EDEMA:  Bulbous edema R ant/med knee  MUSCLE LENGTH: Hamstrings: Right wfl deg; Left wfl deg POSTURE: weight shift left, edematous R ant knee, fading brusing  PALPATION: Exquisitely tender R ant med knee jt line distal  ant thigh.  No warmth noted, R patella sits laterally at rest as compared to L  LOWER EXTREMITY ROM:  Passive ROM Right eval Right  11/10/23  Hip flexion    Hip extension    Hip abduction    Hip adduction    Hip internal rotation    Hip external rotation    Knee flexion 90 122  Knee extension wfl 0  Ankle dorsiflexion    Ankle plantarflexion    Ankle inversion    Ankle eversion     (Blank rows = not tested)  LOWER EXTREMITY MMT:  MMT Right eval Left eval  Hip flexion    Hip extension    Hip abduction    Hip adduction    Hip internal rotation     Hip external rotation    Knee flexion wnl 5/5  Knee extension long arc quad 10 degree lag, but wfl with MMT 4/5  Ankle dorsiflexion    Ankle plantarflexion 4/5   Ankle inversion    Ankle eversion     (Blank rows = not tested)  LOWER EXTREMITY SPECIAL TESTS:  Na too acute  FUNCTIONAL TESTS:  5 times sit to stand: 10 Functional gait assessment: TBD  GAIT: Distance walked: in clinic up to 70', no device, mild antalgia R                                                                                                                                TREATMENT DATE:  11/17/23 Bike L 3.5 x 6 min Resisted backwards walking 30lb x3  Side steps 20lb x3 each Sit to stand holding yellow ball 2x12 Forward & lateral 6in step ups x10 LAQ RLE 3lb 3x10 RLE HS curls green 3x10  11/10/23 NuStep L 5 x 6 min Goals  Sit to stand holding yellow ball 2x10 HS Curls green 2x15 RLE LAQ RLE 3lb 2x10 Forward & lateral 6in step ups x10 Leg press 30lb 2x15  Hs curls 25lb 2x10 Leg Ext 10lb 2x10  11/03/23 R knee PROM with end range holds  Patellar mobs  Bike L2.5 x 6 min 4in step ups x10 each HS Curls green 2x15 Sit to stand 2x10 Resisted backwards walking 20lb x5  Side steps x3 each Alt 6in box taps from airex x10  Heel raises 2x10 calf stretch  10/21/23:  Supine with wedge under R LE,  for electrical stim R distal quads, russian, 10 sec on, 10 sec off, for 6 min total, 4 pads to engage R quads.  Ultrasound, 1 MHz, 1.5 w/cm 2, cont, x 7 min R med, ant knee and distal quads  Kinesiotaping R medial knee, 2 I pieces with lantern cut , paper on to reduce edema  R knee flexion over 100 degrees today   Verbally reviewed elevation for edema and the ankle pumps with t band and SLR with R LE ER as well  10/19/23:  Supine for ultrasound continuous over  medial and ant R knee 6 min 1.5 w/sm2 Instructed and demonstrated elevation of R LE over heart to reduce R knee edema.  Instructed in ankle pumps  with black t band to improve  muscle pump R ankle/Lower ext Kinesiotaping R ant knee for edema management, 2 I pieces cut in [place] lantern shape, paper on to reduce edema Seated R quad sets with R LE ER 40 degrees   PATIENT EDUCATION:  Education details: POC, goals Person educated: Patient Education method: Explanation, Demonstration, Tactile cues, and Verbal cues Education comprehension: verbalized understanding, returned demonstration, verbal cues required, and tactile cues required  HOME EXERCISE PROGRAM: Access Code: [facility] URL: [email or web address] Date: 10/19/2023 Prepared by: Amy Speaks  Exercises - Ankle and Toe Plantarflexion with Resistance  - 1 x daily - 7 x weekly - 3 sets - 10 reps - Seated Quad Set  - 1 x daily - 7 x weekly - 3 sets - 10 reps  ASSESSMENT:  CLINICAL IMPRESSION: Patient is a 68 y.o. female who participated in her fourth session today by physical therapy following a fall on R ant/med knee and resultant large contusion. She continues to  report improvement overall with less pain. All interventions completed well. Some functional weakness with RLE noted during session. Cues for core engagement needed with sit to stands. Time needed to rest etih step ups due to fatigue.    OBJECTIVE IMPAIRMENTS: decreased ROM, decreased strength, increased edema, impaired perceived functional ability, and pain.   ACTIVITY LIMITATIONS: carrying, squatting, sleeping, stairs, transfers, locomotion level, and caring for others  PARTICIPATION LIMITATIONS: cleaning, laundry, shopping, and community activity  PERSONAL FACTORS: Fitness, Past/current experiences, Time since onset of injury/illness/exacerbation, and 1-2 comorbidities: Dm with peripheral neuropathy are also affecting patient's functional outcome.   REHAB POTENTIAL: Good  CLINICAL DECISION MAKING: Evolving/moderate complexity  EVALUATION COMPLEXITY: Moderate   GOALS: Goals reviewed with  patient? Yes  SHORT TERM GOALS: Target date: 2 weeks 11/02/23 I HEP Baseline: Goal status: Met 11/10/22   LONG TERM GOALS: Target date: 12/14/23, 8 weeks  FGA 28/30, Baseline: TBD Goal status: INITIAL  2.  ROM R knee flex 115 Baseline: 90 Goal status: Met  // 3.  Long arc quad no lag R LE Baseline: 10 degree lag Goal status: INITIAL  4.  Kujala Score (Anterior Knee Pain Scale - AKPS): 41 / 100 = 41.0 %, improve to 75% or greater  Baseline:  Goal status:  84 / 100 = 84.0 % Met  //  PLAN:  PT FREQUENCY: 2x/week  PT DURATION: 8 weeks  PLANNED INTERVENTIONS: 97110-Therapeutic exercises, 97530- Therapeutic activity, [id number]- Neuromuscular re-education, 97535- Self Care, and [id number]- Manual therapy  PLAN FOR NEXT SESSION: how was kinesiotaping, therex ultrasound. . Progress ex as needed.   Ollen Beverage, PTA,  11/17/2023, 1:45 PM

## 2023-11-20 ENCOUNTER — Other Ambulatory Visit: Payer: Self-pay | Admitting: Family Medicine

## 2023-11-24 ENCOUNTER — Ambulatory Visit: Admitting: Physical Therapy

## 2023-11-24 ENCOUNTER — Encounter: Payer: Self-pay | Admitting: Physical Therapy

## 2023-11-24 DIAGNOSIS — R262 Difficulty in walking, not elsewhere classified: Secondary | ICD-10-CM | POA: Diagnosis not present

## 2023-11-24 DIAGNOSIS — M25561 Pain in right knee: Secondary | ICD-10-CM

## 2023-11-24 NOTE — Therapy (Signed)
 OUTPATIENT PHYSICAL THERAPY LOWER EXTREMITY TREATMENT   Patient Name: Traci Harper MRN: [id number] DOB:[birth year], 68 y.o., female Today's Date: 11/24/2023  END OF SESSION:  PT End of Session - 11/24/23 1014     Visit Number 6    Date for PT Re-Evaluation 12/14/23    PT Start Time 1015    PT Stop Time 1100    PT Time Calculation (min) 45 min    Activity Tolerance Patient tolerated treatment well    Behavior During Therapy WFL for tasks assessed/performed           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 66   Thyroid  disease    Past Surgical History:  Procedure Laterality Date   APPENDECTOMY     BACK SURGERY     BREAST BIOPSY Right    CESAREAN SECTION     x2   CHOLECYSTECTOMY     DILATION AND CURETTAGE OF UTERUS     at 44   HEMORRHOID SURGERY     HERNIA REPAIR     LIVER TRANSPLANT  09/17/2012   @[facility]    THYROID  SURGERY     x2   TUBAL LIGATION     V-TACH ABLATION     Patient Active Problem List   Diagnosis Date Noted   Diabetic neuropathy (HCC) 03/26/2023   Senile purpura (HCC) 03/26/2023   Sleep-disordered breathing 06/13/2022   Stress 10/01/2021   Dyslipidemia associated with type 2 diabetes mellitus (HCC) 10/01/2021   Pulmonary embolism (HCC) 04/02/2021   Hypertension associated with diabetes (HCC) 04/23/2020   Type 2 diabetes mellitus with hyperglycemia (HCC) 04/23/2020   Hypothyroidism 04/23/2020   GERD (gastroesophageal reflux disease) 04/23/2020   Liver transplanted 2014 [facility]) follows with [facility] 04/01/2013    PCP: Worth Kitty, MD  REFERRING PROVIDER: same  REFERRING DIAG: R knee pain /contusion  THERAPY DIAG:  Difficulty in walking, not elsewhere classified  Acute pain of right knee  Rationale for Evaluation and Treatment: Rehabilitation  ONSET DATE: 09/22/23  SUBJECTIVE:    SUBJECTIVE STATEMENT: Good  PERTINENT HISTORY: Fell during the night over cat, landed on R knee  PAIN:  Are you having pain? Yes: NPRS scale: 0/10 Pain location: R ant/med knee Pain description: aching, very tender to palpate Aggravating factors: stair ascending and descending Relieving factors: sitting  PRECAUTIONS: Fall  RED FLAGS: None   WEIGHT BEARING RESTRICTIONS: No  FALLS:  Has patient fallen in last 6 months? Yes. 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  DIAGNOSTIC FINDINGS: no fractures   PATIENT SURVEYS:  Kujala Score (Anterior Knee Pain Scale - AKPS): 41 / 100 = 41.0 %  COGNITION: Overall cognitive status: Within functional limits for tasks assessed     SENSATION: Reports loss of sensation B feet due to longstanding neuropathy  EDEMA:  Bulbous edema R ant/med knee  MUSCLE LENGTH: Hamstrings: Right wfl deg; Left wfl deg POSTURE: weight shift left, edematous R ant knee, fading brusing  PALPATION: Exquisitely tender  R ant med knee jt line distal ant thigh.  No warmth noted, R patella sits laterally at rest as compared to L  LOWER EXTREMITY ROM:  Passive ROM Right eval Right  11/10/23  Hip flexion    Hip extension    Hip abduction    Hip adduction    Hip internal rotation    Hip external rotation    Knee flexion 90 122  Knee extension wfl 0  Ankle dorsiflexion    Ankle plantarflexion    Ankle inversion    Ankle eversion     (Blank rows = not tested)  LOWER EXTREMITY MMT:  MMT Right eval Left eval  Hip flexion    Hip extension    Hip abduction    Hip adduction     Hip internal rotation    Hip external rotation    Knee flexion wnl 5/5  Knee extension long arc quad 10 degree lag, but wfl with MMT 4/5  Ankle dorsiflexion    Ankle plantarflexion 4/5   Ankle inversion    Ankle eversion     (Blank rows = not tested)  LOWER EXTREMITY SPECIAL TESTS:  Na too acute  FUNCTIONAL TESTS:  5 times sit to stand: 10 Functional gait assessment: TBD  GAIT: Distance walked: in clinic up to 70', no device, mild antalgia R                                                                                                                                TREATMENT DATE:  11/24/23 Bike L 3.5 x 6 min. 6in forward & Lateral step ups x10 HS curls 25lb 2x10, RLE 15lb 2x10 Leg Ext 10lb 2x10, RLE 5lb 2x10 R knee PROM with end range holds  Patellar mobs   11/17/23 Bike L 3.5 x 6 min Resisted backwards walking 30lb x3  Side steps 20lb x3 each Sit to stand holding yellow ball 2x12 Forward & lateral 6in step ups x10 LAQ RLE 3lb 3x10 RLE HS curls green 3x10  11/10/23 NuStep L 5 x 6 min Goals  Sit to stand holding yellow ball 2x10 HS Curls green 2x15 RLE LAQ RLE 3lb 2x10 Forward & lateral 6in step ups x10 Leg press 30lb 2x15  Hs curls 25lb 2x10 Leg Ext 10lb 2x10  11/03/23 R knee PROM with end range holds  Patellar mobs  Bike L2.5 x 6 min 4in step ups x10 each HS Curls green 2x15 Sit to stand 2x10 Resisted backwards walking 20lb x5  Side steps x3 each Alt 6in box taps from airex x10  Heel raises 2x10 calf stretch  10/21/23:  Supine with wedge under R LE,  for electrical stim R distal quads, russian, 10 sec on, 10 sec off, for 6 min total, 4 pads to engage R quads.  Ultrasound, 1 MHz, 1.5 w/cm 2, cont, x 7 min R med, ant knee and distal quads  Kinesiotaping R medial knee, 2 I pieces  with lantern cut , paper on to reduce edema  R knee flexion over 100 degrees today   Verbally reviewed elevation for edema and the ankle pumps with t band and SLR with R  LE ER as well  10/19/23:  Supine for ultrasound continuous over medial and ant R knee 6 min 1.5 w/sm2 Instructed and demonstrated elevation of R LE over heart to reduce R knee edema.  Instructed in ankle pumps with black t band to improve  muscle pump R ankle/Lower ext Kinesiotaping R ant knee for edema management, 2 I pieces cut in [place] lantern shape, paper on to reduce edema Seated R quad sets with R LE ER 40 degrees   PATIENT EDUCATION:  Education details: POC, goals Person educated: Patient Education method: Explanation, Demonstration, Tactile cues, and Verbal cues Education comprehension: verbalized understanding, returned demonstration, verbal cues required, and tactile cues required  HOME EXERCISE PROGRAM: Access Code: [facility] URL: [email or web address] Date: 10/19/2023 Prepared by: Amy Speaks  Exercises - Ankle and Toe Plantarflexion with Resistance  - 1 x daily - 7 x weekly - 3 sets - 10 reps - Seated Quad Set  - 1 x daily - 7 x weekly - 3 sets - 10 reps  ASSESSMENT:  CLINICAL IMPRESSION: Patient is a 68 y.o. female who participated in her sixth session today by physical therapy following a fall on R ant/med knee and resultant large contusion. She continues to  report improvement overall with less pain. All interventions completed well. Some functional weakness with RLE noted with lateral step ups. Pt repots no functional limitations at home and is pleased with her current functional status  OBJECTIVE IMPAIRMENTS: decreased ROM, decreased strength, increased edema, impaired perceived functional ability, and pain.   ACTIVITY LIMITATIONS: carrying, squatting, sleeping, stairs, transfers, locomotion level, and caring for others  PARTICIPATION LIMITATIONS: cleaning, laundry, shopping, and community activity  PERSONAL FACTORS: Fitness, Past/current experiences, Time since onset of injury/illness/exacerbation, and 1-2 comorbidities: Dm with peripheral  neuropathy are also affecting patient's functional outcome.   REHAB POTENTIAL: Good  CLINICAL DECISION MAKING: Evolving/moderate complexity  EVALUATION COMPLEXITY: Moderate   GOALS: Goals reviewed with patient? Yes  SHORT TERM GOALS: Target date: 2 weeks 11/02/23 I HEP Baseline: Goal status: Met 11/10/22   LONG TERM GOALS: Target date: 12/14/23, 8 weeks  FGA 28/30, Baseline: TBD Goal status: INITIAL  2.  ROM R knee flex 115 Baseline: 90 Goal status: Met  // 3.  Long arc quad no lag R LE Baseline: 10 degree lag Goal status: Met 11/24/23  4.  Kujala Score (Anterior Knee Pain Scale - AKPS): 41 / 100 = 41.0 %, improve to 75% or greater  Baseline:  Goal status:  84 / 100 = 84.0 % Met    PLAN:  PT FREQUENCY: 2x/week  PT DURATION: 8 weeks  PLANNED INTERVENTIONS: 97110-Therapeutic exercises, 97530- Therapeutic activity, [id number]- Neuromuscular re-education, 97535- Self Care, and [id number]- Manual therapy  PLAN FOR NEXT SESSION: d/c PT  PHYSICAL THERAPY DISCHARGE SUMMARY  Visits from Start of Care: 6 Patient agrees to discharge. Patient goals were met. Patient is being discharged due to meeting the stated rehab goals.  Tanda KANDICE Sorrow, PTA,  11/24/2023, 10:14 AM

## 2023-12-03 ENCOUNTER — Other Ambulatory Visit: Payer: Self-pay | Admitting: Family Medicine

## 2023-12-13 ENCOUNTER — Other Ambulatory Visit: Payer: Self-pay | Admitting: Family Medicine

## 2023-12-24 ENCOUNTER — Encounter: Payer: Self-pay | Admitting: Family Medicine

## 2023-12-24 ENCOUNTER — Ambulatory Visit: Payer: Medicare Other | Admitting: Family Medicine

## 2023-12-24 VITALS — BP 144/88 | HR 86 | Temp 97.3°F | Ht 62.0 in | Wt 180.8 lb

## 2023-12-24 DIAGNOSIS — E1159 Type 2 diabetes mellitus with other circulatory complications: Secondary | ICD-10-CM

## 2023-12-24 DIAGNOSIS — I152 Hypertension secondary to endocrine disorders: Secondary | ICD-10-CM

## 2023-12-24 DIAGNOSIS — E1165 Type 2 diabetes mellitus with hyperglycemia: Secondary | ICD-10-CM | POA: Diagnosis not present

## 2023-12-24 LAB — POCT GLYCOSYLATED HEMOGLOBIN (HGB A1C): Hemoglobin A1C: 5.1 % (ref 4.0–5.6)

## 2023-12-24 NOTE — Assessment & Plan Note (Signed)
 A1c very well-controlled at 5.1.  Doing very well with Mounjaro  10 mg weekly and metformin  1000 mg daily.  Will continue current regimen.  She is down about 14 pounds since our last visit.  Congratulated patient on weight loss.  Recheck in 6 months at CPE.

## 2023-12-24 NOTE — Patient Instructions (Addendum)
 It was very nice to see you today!  Your A1c very well-controlled today.  Please keep up the great work.  No medication changes today.  I will see you back in 6 months for your annual physical.  Come back sooner if needed.  Return in about 6 months (around 06/25/2024) for Annual Physical.   Take care, Dr Kennyth  PLEASE NOTE:  If you had any lab tests, please let us  know if you have not heard back within a few days. You may see your results on mychart before we have a chance to review them but we will give you a call once they are reviewed by us .   If we ordered any referrals today, please let us  know if you have not heard from their office within the next week.   If you had any urgent prescriptions sent in today, please check with the pharmacy within an hour of our visit to make sure the prescription was transmitted appropriately.   Please try these tips to maintain a healthy lifestyle:  Eat at least 3 REAL meals and 1-2 snacks per day.  Aim for no more than 5 hours between eating.  If you eat breakfast, please do so within one hour of getting up.   Each meal should contain half fruits/vegetables, one quarter protein, and one quarter carbs (no bigger than a computer mouse)  Cut down on sweet beverages. This includes juice, soda, and sweet tea.   Drink at least 1 glass of water with each meal and aim for at least 8 glasses per day  Exercise at least 150 minutes every week.

## 2023-12-24 NOTE — Assessment & Plan Note (Signed)
 Very slightly elevated today though typically well-controlled.  Will continue metoprolol  tartrate 50 mg twice daily and Benicar  5 mg daily.  She can continue to monitor at home and follow-up with us  in a few weeks if persistently elevated.  Recheck in 3 to 6 months at next office visit.

## 2023-12-24 NOTE — Progress Notes (Signed)
   Traci Harper is a 68 y.o. female who presents today for an office visit.  Assessment/Plan:  Chronic Problems Addressed Today: Type 2 diabetes mellitus with hyperglycemia (HCC) A1c very well-controlled at 5.1.  Doing very well with Mounjaro  10 mg weekly and metformin  1000 mg daily.  Will continue current regimen.  She is down about 14 pounds since our last visit.  Congratulated patient on weight loss.  Recheck in 6 months at CPE.  Hypertension associated with diabetes (HCC) Very slightly elevated today though typically well-controlled.  Will continue metoprolol  tartrate 50 mg twice daily and Benicar  5 mg daily.  She can continue to monitor at home and follow-up with us  in a few weeks if persistently elevated.  Recheck in 3 to 6 months at next office visit.     Subjective:  HPI:  See Assessment / plan for status of chronic conditions.  Patient is here today for follow-up.  Her A1c was 5.3 six months ago Mounjaro  10 mg weekly.  We decreased metformin  to 1000 mg daily at that time as well.  She is doing well today.  No acute concerns.       Objective:  Physical Exam: BP (!) 144/88   Pulse 86   Temp (!) 97.3 F (36.3 C) (Temporal)   Ht 5' 2 (1.575 m)   Wt 180 lb 12.8 oz (82 kg)   SpO2 97%   BMI 33.07 kg/m   Wt Readings from Last 3 Encounters:  12/24/23 180 lb 12.8 oz (82 kg)  09/29/23 194 lb 9.6 oz (88.3 kg)  09/23/23 198 lb (89.8 kg)    Gen: No acute distress, resting comfortably CV: Regular rate and rhythm with no murmurs appreciated Pulm: Normal work of breathing, clear to auscultation bilaterally with no crackles, wheezes, or rhonchi Neuro: Grossly normal, moves all extremities Psych: Normal affect and thought content      Traci Harper M. Kennyth, MD 12/24/2023 9:21 AM

## 2023-12-30 ENCOUNTER — Other Ambulatory Visit: Payer: Self-pay | Admitting: Family Medicine

## 2024-01-24 ENCOUNTER — Other Ambulatory Visit: Payer: Self-pay | Admitting: Family Medicine

## 2024-02-24 ENCOUNTER — Ambulatory Visit (INDEPENDENT_AMBULATORY_CARE_PROVIDER_SITE_OTHER)

## 2024-02-24 VITALS — Ht 62.0 in | Wt 170.0 lb

## 2024-02-24 DIAGNOSIS — Z0000 Encounter for general adult medical examination without abnormal findings: Secondary | ICD-10-CM | POA: Diagnosis not present

## 2024-02-24 NOTE — Patient Instructions (Signed)
 Traci Harper,  Thank you for taking the time for your Medicare Wellness Visit. I appreciate your continued commitment to your health goals. Please review the care plan we discussed, and feel free to reach out if I can assist you further.  Medicare recommends these wellness visits once per year to help you and your care team stay ahead of potential health issues. These visits are designed to focus on prevention, allowing your provider to concentrate on managing your acute and chronic conditions during your regular appointments.  Please note that Annual Wellness Visits do not include a physical exam. Some assessments may be limited, especially if the visit was conducted virtually. If needed, we may recommend a separate in-person follow-up with your provider.  Ongoing Care Seeing your primary care provider every 3 to 6 months helps us  monitor your health and provide consistent, personalized care.   Referrals If a referral was made during today's visit and you haven't received any updates within two weeks, please contact the referred provider directly to check on the status.  Recommended Screenings:  Health Maintenance  Topic Date Due   Complete foot exam   Never done   Yearly kidney health urinalysis for diabetes  Never done   Zoster (Shingles) Vaccine (1 of 2) Never done   Colon Cancer Screening  Never done   Flu Shot  01/01/2024   COVID-19 Vaccine (6 - 2025-26 season) 02/01/2024   Yearly kidney function blood test for diabetes  03/25/2024   Pap with HPV screening  03/25/2024*   Breast Cancer Screening  03/25/2024*   DEXA scan (bone density measurement)  03/25/2024*   Hemoglobin A1C  06/25/2024   Eye exam for diabetics  07/07/2024   Medicare Annual Wellness Visit  02/23/2025   DTaP/Tdap/Td vaccine (3 - Td or Tdap) 10/02/2031   Pneumococcal Vaccine for age over 65  Completed   Hepatitis C Screening  Completed   HIV Screening  Completed   HPV Vaccine  Aged Out   Meningitis B Vaccine   Aged Out   Hepatitis B Vaccine  Discontinued  *Topic was postponed. The date shown is not the original due date.       02/24/2024    1:15 PM  Advanced Directives  Does Patient Have a Medical Advance Directive? Yes  Type of Estate agent of [place];Living will  Copy of Healthcare Power of Attorney in Chart? No - copy requested   Advance Care Planning is important because it: Ensures you receive medical care that aligns with your values, goals, and preferences. Provides guidance to your family and loved ones, reducing the emotional burden of decision-making during critical moments.  Vision: Annual vision screenings are recommended for early detection of glaucoma, cataracts, and diabetic retinopathy. These exams can also reveal signs of chronic conditions such as diabetes and high blood pressure.  Dental: Annual dental screenings help detect early signs of oral cancer, gum disease, and other conditions linked to overall health, including heart disease and diabetes.  Please see the attached documents for additional preventive care recommendations.

## 2024-02-24 NOTE — Progress Notes (Signed)
 Subjective:   LESETTE FRARY is a 68 y.o. who presents for a Medicare Wellness preventive visit.  As a reminder, Annual Wellness Visits don't include a physical exam, and some assessments may be limited, especially if this visit is performed virtually. We may recommend an in-person follow-up visit with your provider if needed.  Visit Complete: Virtual I connected with  Candis FORBES Gold on 02/24/24 by a audio enabled telemedicine application and verified that I am speaking with the correct person using two identifiers.  Patient Location: Home  Provider Location: Home Office  I discussed the limitations of evaluation and management by telemedicine. The patient expressed understanding and agreed to proceed.  Vital Signs: Because this visit was a virtual/telehealth visit, some criteria may be missing or patient reported. Any vitals not documented were not able to be obtained and vitals that have been documented are patient reported.  VideoDeclined- This patient declined Librarian, academic. Therefore the visit was completed with audio only.  Persons Participating in Visit: Patient.  AWV Questionnaire: No: Patient Medicare AWV questionnaire was not completed prior to this visit.  Cardiac Risk Factors include: advanced age (>75men, >45 women);hypertension;diabetes mellitus;dyslipidemia;obesity (BMI >30kg/m2)     Objective:    Today's Vitals   02/24/24 1310  Weight: 170 lb (77.1 kg)  Height: 5' 2 (1.575 m)   Body mass index is 31.09 kg/m.     02/24/2024    1:15 PM 10/19/2023    2:35 PM 09/23/2023    3:35 AM 03/13/2022   11:51 PM  Advanced Directives  Does Patient Have a Medical Advance Directive? Yes Yes Yes Yes  Type of Estate agent of [place];Living will  Healthcare Power of [place];Living will Living will  Does patient want to make changes to medical advance directive?   No - Patient declined No - Patient declined  Copy of  Healthcare Power of Attorney in Chart? No - copy requested  No - copy available, Physician notified     Current Medications (verified) Outpatient Encounter Medications as of 02/24/2024  Medication Sig   atorvastatin  (LIPITOR) 40 MG tablet Take 1 tablet by mouth once daily   Blood Glucose Monitoring Suppl (ONETOUCH VERIO FLEX SYSTEM) w/Device KIT USE   TO CHECK GLUCOSE FOR BLOOD SUGAR   ELIQUIS  5 MG TABS tablet Take 1 tablet by mouth twice daily   glucose blood test strip 1 each by Other route as needed for other. Use as instructed   levocetirizine (XYZAL ) 5 MG tablet Take 1 tablet (5 mg total) by mouth every evening.   levothyroxine  (SYNTHROID ) 175 MCG tablet Take 1 tablet (175 mcg total) by mouth daily.   MAGNESIUM -OXIDE 400 (240 Mg) MG tablet Take 1 tablet by mouth twice daily   metoprolol  tartrate (LOPRESSOR ) 50 MG tablet Take 1 tablet by mouth twice daily   Multiple Vitamin (ONE DAILY) tablet Take 1 tablet by mouth daily.    mycophenolate  (MYFORTIC ) 360 MG TBEC Take 360 mg by mouth 2 (two) times daily.    olmesartan  (BENICAR ) 20 MG tablet Take 1 tablet by mouth once daily   omeprazole  (PRILOSEC) 20 MG capsule Take 1 capsule by mouth once daily   PROGRAF  0.5 MG capsule Take 0.5 mg by mouth 2 (two) times daily.   tirzepatide  (MOUNJARO ) 10 MG/0.5ML Pen Inject 10 mg into the skin once a week.   metFORMIN  (GLUCOPHAGE ) 1000 MG tablet Take 1 tablet (1,000 mg total) by mouth daily with breakfast. (Patient not taking: Reported on  02/24/2024)   No facility-administered encounter medications on file as of 02/24/2024.    Allergies (verified) Lisinopril   History: 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 46   Thyroid  disease    Past Surgical History:  Procedure Laterality Date   APPENDECTOMY     BACK SURGERY     BREAST BIOPSY Right    CESAREAN  SECTION     x2   CHOLECYSTECTOMY     DILATION AND CURETTAGE OF UTERUS     at 49   HEMORRHOID SURGERY     HERNIA REPAIR     LIVER TRANSPLANT  09/17/2012   @[email or web address]  [employer] [email or web address]    THYROID  SURGERY     x2   TUBAL LIGATION     V-TACH ABLATION     Family History  Problem Relation Age of Onset   Diabetes Mother    Heart disease Father    Diabetes Father    Sleep apnea Son    Social History   Socioeconomic History   Marital status: Married    Spouse name: Not on file   Number of children: Not on file   Years of education: Not on file   Highest education level: Not on file  Occupational History   Not on file  Tobacco Use   Smoking status: Former    Current packs/day: 0.00    Types: Cigarettes    Quit date: 06/02/2002    Years since quitting: 21.7   Smokeless tobacco: Not on file  Substance and Sexual Activity   Alcohol use: No    Comment: No alcohol since 2012   Drug use: No   Sexual activity: Never  Other Topics Concern   Not on file  Social History Narrative   Not on file   Social Drivers of Health   Financial Resource Strain: Low Risk  (02/24/2024)   Overall Financial Resource Strain (CARDIA)    Difficulty of Paying Living Expenses: Not hard at all  Food Insecurity: No Food Insecurity (02/24/2024)   Hunger Vital Sign    Worried About Running Out of Food in the Last Year: Never true    Ran Out of Food in the Last Year: Never true  Transportation Needs: No Transportation Needs (02/24/2024)   PRAPARE - Administrator, Civil Service (Medical): No    Lack of Transportation (Non-Medical): No  Physical Activity: Inactive (02/24/2024)   Exercise Vital Sign    Days of Exercise per Week: 0 days    Minutes of Exercise per Session: 0 min  Stress: No Stress Concern Present (02/24/2024)   [employer] of Occupational Health - Occupational Stress Questionnaire    Feeling of Stress: Not at all  Social Connections: Moderately Isolated (02/24/2024)   Social  Connection and Isolation Panel    Frequency of Communication with Friends and Family: More than three times a week    Frequency of Social Gatherings with Friends and Family: Twice a week    Attends Religious Services: Never    Database administrator or Organizations: No    Attends Engineer, structural: Never    Marital Status: Married    Tobacco Counseling Counseling given: Not Answered    Clinical Intake:  Pre-visit preparation completed: Yes  Pain : No/denies pain     BMI - recorded:  31.09 Nutritional Status: BMI > 30  Obese Diabetes: Yes CBG done?: No Did pt. bring in CBG monitor from home?: No  Lab Results  Component Value Date   HGBA1C 5.1 12/24/2023   HGBA1C 5.3 06/26/2023   HGBA1C 6.9 (H) 03/26/2023     How often do you need to have someone help you when you read instructions, pamphlets, or other written materials from your doctor or pharmacy?: 1 - Never  Interpreter Needed?: No  Information entered by :: Ellouise Haws, :LPN   Activities of Daily Living     02/24/2024    1:12 PM  In your present state of health, do you have any difficulty performing the following activities:  Hearing? 0  Vision? 0  Difficulty concentrating or making decisions? 0  Walking or climbing stairs? 0  Dressing or bathing? 0  Doing errands, shopping? 0  Preparing Food and eating ? N  Using the Toilet? N  In the past six months, have you accidently leaked urine? N  Do you have problems with loss of bowel control? N  Managing your Medications? N  Managing your Finances? N  Housekeeping or managing your Housekeeping? N    Patient Care Team: Kennyth Worth HERO, MD as PCP - General (Family Medicine)  I have updated your Care Teams any recent Medical Services you may have received from other providers in the past year.     Assessment:   This is a routine wellness examination for [place].  Hearing/Vision screen Hearing Screening - Comments:: Pt denies any hearing  issues  Vision Screening - Comments:: Wears rx glasses - up to date with routine eye exams with [facility]   Goals Addressed             This Visit's Progress    Patient Stated       Continue weight loss       Depression Screen     02/24/2024    1:13 PM 12/24/2023    8:44 AM 09/29/2023    1:45 PM 06/26/2023    9:25 AM 06/26/2023    9:16 AM 03/26/2023    9:29 AM 03/26/2023    9:26 AM  PHQ 2/9 Scores  PHQ - 2 Score 0 0 0 0 0 0 0  PHQ- 9 Score    0  0     Fall Risk     02/24/2024    1:14 PM 12/24/2023    8:44 AM 09/29/2023    1:45 PM 06/26/2023    9:16 AM 03/26/2023    9:27 AM  Fall Risk   Falls in the past year? 1 0 1 0 0  Number falls in past yr: 1 0 0 0 0  Injury with Fall? 1 0 1 0 0  Comment right knee      Risk for fall due to : History of fall(s) No Fall Risks No Fall Risks No Fall Risks No Fall Risks  Follow up Falls prevention discussed        MEDICARE RISK AT HOME:  Medicare Risk at Home Any stairs in or around the home?: Yes If so, are there any without handrails?: No Home free of loose throw rugs in walkways, pet beds, electrical cords, etc?: Yes Adequate lighting in your home to reduce risk of falls?: Yes Life alert?: No Use of a cane, walker or w/c?: No Grab bars in the bathroom?: No Shower chair or bench in shower?: No Elevated toilet seat or a handicapped toilet?: No  TIMED UP AND GO:  Was the test performed?  No  Cognitive Function: 6CIT completed        02/24/2024    1:16 PM  6CIT Screen  What Year? 0 points  What month? 0 points  What time? 0 points  Count back from 20 0 points  Months in reverse 0 points  Repeat phrase 0 points  Total Score 0 points    Immunizations Immunization History  Administered Date(s) Administered   Fluzone Influenza virus vaccine,trivalent (IIV3), split virus 06/22/2009   Hep A / Hep B 04/18/2013, 06/06/2013, 08/29/2013   Hep A, Unspecified 06/06/2013   Hep B, Unspecified 06/06/2013   Hepatitis A  06/06/2013   Hepatitis A, Adult 06/06/2013   Hepatitis B 06/06/2013   Hepatitis B, ADULT 06/06/2013   INFLUENZA, HIGH DOSE SEASONAL PF 03/24/2023   Influenza Nasal 06/05/2008, 06/25/2009, 03/25/2010, 04/12/2012, 12/31/2012, 04/03/2014, 03/12/2015, 02/14/2017   Influenza Split 06/05/2008, 06/25/2009, 03/25/2010, 04/12/2012, 12/31/2012, 04/03/2014, 03/12/2015, 02/14/2017, 02/10/2018, 01/21/2019, 01/25/2020   Influenza, Quadrivalent, Recombinant, Inj, Pf 03/10/2022   Influenza, Seasonal, Injecte, Preservative Fre 06/17/2012, 02/14/2017   Influenza,inj,Quad PF,6+ Mos 02/10/2018, 01/21/2019   Influenza,inj,Quad PF,6-35 Mos 02/10/2018, 01/21/2019   Influenza,inj,quad, With Preservative 02/14/2017   Influenza-Unspecified 06/05/2008, 06/25/2009, 03/25/2010, 04/12/2012, 12/31/2012, 02/15/2013, 04/03/2014, 03/12/2015, 02/14/2017, 01/30/2020, 03/21/2021, 03/10/2022   PFIZER(Purple Top)SARS-COV-2 Vaccination 08/18/2019, 09/12/2019, 02/12/2020, 10/23/2020   PNEUMOCOCCAL CONJUGATE-20 03/26/2023   Pfizer(Comirnaty)Fall Seasonal Vaccine 12 years and older 03/10/2022   Pneumococcal Conjugate PCV 7 02/15/2013   Pneumococcal Conjugate-13 02/15/2013   Pneumococcal Polysaccharide-23 04/12/2012   Tdap 03/25/2010, 10/01/2021    Screening Tests Health Maintenance  Topic Date Due   FOOT EXAM  Never done   Diabetic kidney evaluation - Urine ACR  Never done   Zoster Vaccines- Shingrix (1 of 2) Never done   Colonoscopy  Never done   Influenza Vaccine  01/01/2024   COVID-19 Vaccine (6 - 2025-26 season) 02/01/2024   Diabetic kidney evaluation - eGFR measurement  03/25/2024   Cervical Cancer Screening (HPV/Pap Cotest)  03/25/2024 (Originally 05/08/2017)   Mammogram  03/25/2024 (Originally 03/09/2021)   DEXA SCAN  03/25/2024 (Originally 02/25/2023)   HEMOGLOBIN A1C  06/25/2024   OPHTHALMOLOGY EXAM  07/07/2024   Medicare Annual Wellness (AWV)  02/23/2025   DTaP/Tdap/Td (3 - Td or Tdap) 10/02/2031   Pneumococcal  Vaccine: 50+ Years  Completed   Hepatitis C Screening  Completed   HIV Screening  Completed   HPV VACCINES  Aged Out   Meningococcal B Vaccine  Aged Out   Hepatitis B Vaccines 19-59 Average Risk  Discontinued    Health Maintenance Items Addressed: See Nurse Notes at the end of this note  Additional Screening:  Vision Screening: Recommended annual ophthalmology exams for early detection of glaucoma and other disorders of the eye. Is the patient up to date with their annual eye exam?  Yes  Who is the provider or what is the name of the office in which the patient attends annual eye exams? [facility]  Dental Screening: Recommended annual dental exams for proper oral hygiene  Community Resource Referral / Chronic Care Management: CRR required this visit?  No   CCM required this visit?  No   Plan:    I have personally reviewed and noted the following in the patient's chart:   Medical and social history Use of alcohol, tobacco or illicit drugs  Current medications and supplements including opioid prescriptions. Patient is not currently taking opioid prescriptions. Functional ability and status Nutritional status Physical activity  Advanced directives List of other physicians Hospitalizations, surgeries, and ER visits in previous 12 months Vitals Screenings to include cognitive, depression, and falls Referrals and appointments  In addition, I have reviewed and discussed with patient certain preventive protocols, quality metrics, and best practice recommendations. A written personalized care plan for preventive services as well as general preventive health recommendations were provided to patient.   Ellouise VEAR Haws, LPN   [id number]   After Visit Summary: (MyChart) Due to this being a telephonic visit, the after visit summary with patients personalized plan was offered to patient via MyChart   Notes: Nothing significant to report at this time. Pt declined colonoscopy at this  time

## 2024-02-29 ENCOUNTER — Other Ambulatory Visit: Payer: Self-pay | Admitting: Family Medicine

## 2024-03-01 ENCOUNTER — Other Ambulatory Visit: Payer: Self-pay | Admitting: Family Medicine

## 2024-03-02 ENCOUNTER — Other Ambulatory Visit: Payer: Self-pay | Admitting: Family Medicine

## 2024-03-12 ENCOUNTER — Other Ambulatory Visit: Payer: Self-pay | Admitting: Family Medicine

## 2024-03-27 ENCOUNTER — Other Ambulatory Visit: Payer: Self-pay | Admitting: Family Medicine

## 2024-03-29 ENCOUNTER — Other Ambulatory Visit: Payer: Self-pay | Admitting: Family Medicine

## 2024-04-23 ENCOUNTER — Other Ambulatory Visit: Payer: Self-pay | Admitting: Family Medicine

## 2024-05-20 ENCOUNTER — Other Ambulatory Visit: Payer: Self-pay | Admitting: Family Medicine

## 2024-05-24 ENCOUNTER — Other Ambulatory Visit: Payer: Self-pay | Admitting: Family Medicine

## 2024-05-29 ENCOUNTER — Other Ambulatory Visit: Payer: Self-pay | Admitting: Family Medicine

## 2024-06-14 ENCOUNTER — Other Ambulatory Visit: Payer: Self-pay | Admitting: Family Medicine

## 2024-06-23 ENCOUNTER — Other Ambulatory Visit: Payer: Self-pay | Admitting: Family Medicine

## 2024-06-27 ENCOUNTER — Other Ambulatory Visit: Payer: Self-pay | Admitting: Family Medicine

## 2024-06-27 ENCOUNTER — Encounter: Admitting: Family Medicine

## 2024-09-27 ENCOUNTER — Encounter: Admitting: Family Medicine

## 2025-03-01 ENCOUNTER — Ambulatory Visit
